# Patient Record
Sex: Female | Born: 1950
Health system: Southern US, Community
[De-identification: ages and names within clinical notes are randomized; demographics above are authoritative.]

## PROBLEM LIST (undated history)

## (undated) DIAGNOSIS — E119 Type 2 diabetes mellitus without complications: Secondary | ICD-10-CM

## (undated) DIAGNOSIS — I10 Essential (primary) hypertension: Secondary | ICD-10-CM

## (undated) HISTORY — PX: ABDOMINAL HYSTERECTOMY: SHX81

---
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
## 1997-04-10 DIAGNOSIS — I10 Essential (primary) hypertension: Secondary | ICD-10-CM

## 1997-04-10 HISTORY — DX: Essential (primary) hypertension: I10

## 1997-10-08 ENCOUNTER — Ambulatory Visit (HOSPITAL_COMMUNITY): Admission: RE | Admit: 1997-10-08 | Discharge: 1997-10-08 | Payer: Self-pay | Admitting: Family Medicine

## 1997-11-03 ENCOUNTER — Encounter: Admission: RE | Admit: 1997-11-03 | Discharge: 1997-11-03 | Payer: Self-pay | Admitting: *Deleted

## 1998-02-10 ENCOUNTER — Ambulatory Visit (HOSPITAL_COMMUNITY): Admission: RE | Admit: 1998-02-10 | Discharge: 1998-02-10 | Payer: Self-pay | Admitting: Family Medicine

## 1998-02-10 ENCOUNTER — Encounter: Payer: Self-pay | Admitting: Family Medicine

## 1999-03-09 ENCOUNTER — Encounter: Payer: Self-pay | Admitting: Family Medicine

## 1999-03-09 ENCOUNTER — Ambulatory Visit (HOSPITAL_COMMUNITY): Admission: RE | Admit: 1999-03-09 | Discharge: 1999-03-09 | Payer: Self-pay | Admitting: Family Medicine

## 2000-02-15 ENCOUNTER — Ambulatory Visit (HOSPITAL_BASED_OUTPATIENT_CLINIC_OR_DEPARTMENT_OTHER): Admission: RE | Admit: 2000-02-15 | Discharge: 2000-02-15 | Payer: Self-pay | Admitting: Family Medicine

## 2000-04-01 ENCOUNTER — Ambulatory Visit (HOSPITAL_BASED_OUTPATIENT_CLINIC_OR_DEPARTMENT_OTHER): Admission: RE | Admit: 2000-04-01 | Discharge: 2000-04-01 | Payer: Self-pay | Admitting: Family Medicine

## 2002-07-22 ENCOUNTER — Other Ambulatory Visit: Admission: RE | Admit: 2002-07-22 | Discharge: 2002-07-22 | Payer: Self-pay | Admitting: Obstetrics & Gynecology

## 2002-09-12 ENCOUNTER — Ambulatory Visit (HOSPITAL_COMMUNITY): Admission: RE | Admit: 2002-09-12 | Discharge: 2002-09-12 | Payer: Self-pay | Admitting: Family Medicine

## 2003-04-11 DIAGNOSIS — E119 Type 2 diabetes mellitus without complications: Secondary | ICD-10-CM

## 2003-04-11 HISTORY — DX: Type 2 diabetes mellitus without complications: E11.9

## 2003-08-12 ENCOUNTER — Other Ambulatory Visit: Admission: RE | Admit: 2003-08-12 | Discharge: 2003-08-12 | Payer: Self-pay | Admitting: Obstetrics & Gynecology

## 2003-09-28 ENCOUNTER — Encounter (INDEPENDENT_AMBULATORY_CARE_PROVIDER_SITE_OTHER): Payer: Self-pay | Admitting: *Deleted

## 2003-09-28 ENCOUNTER — Ambulatory Visit (HOSPITAL_COMMUNITY): Admission: RE | Admit: 2003-09-28 | Discharge: 2003-09-28 | Payer: Self-pay | Admitting: Gastroenterology

## 2003-12-26 ENCOUNTER — Inpatient Hospital Stay (HOSPITAL_COMMUNITY): Admission: EM | Admit: 2003-12-26 | Discharge: 2003-12-30 | Payer: Self-pay | Admitting: Family Medicine

## 2004-09-14 ENCOUNTER — Other Ambulatory Visit: Admission: RE | Admit: 2004-09-14 | Discharge: 2004-09-14 | Payer: Self-pay | Admitting: Obstetrics & Gynecology

## 2004-10-19 ENCOUNTER — Encounter: Admission: RE | Admit: 2004-10-19 | Discharge: 2004-10-19 | Payer: Self-pay | Admitting: Family Medicine

## 2006-11-21 ENCOUNTER — Emergency Department (HOSPITAL_COMMUNITY): Admission: EM | Admit: 2006-11-21 | Discharge: 2006-11-21 | Payer: Self-pay | Admitting: Emergency Medicine

## 2006-11-24 ENCOUNTER — Encounter: Admission: RE | Admit: 2006-11-24 | Discharge: 2006-11-24 | Payer: Self-pay | Admitting: Family Medicine

## 2006-11-27 ENCOUNTER — Encounter: Admission: RE | Admit: 2006-11-27 | Discharge: 2007-02-15 | Payer: Self-pay | Admitting: Family Medicine

## 2008-06-14 ENCOUNTER — Emergency Department (HOSPITAL_COMMUNITY): Admission: EM | Admit: 2008-06-14 | Discharge: 2008-06-14 | Payer: Self-pay | Admitting: Family Medicine

## 2010-08-26 NOTE — Op Note (Signed)
NAME:  Allison Matthews, Allison Matthews                     ACCOUNT NO.:  [id number]   MEDICAL RECORD NO.:  [id number]                   PATIENT TYPE:  AMB   LOCATION:  ENDO                                 FACILITY:  [facility]   PHYSICIAN:  Anselmo Rod, M.D.               DATE OF BIRTH:  07/08/1950   DATE OF PROCEDURE:  09/28/2003  DATE OF DISCHARGE:                                 OPERATIVE REPORT   PROCEDURE PERFORMED:  Colonoscopy with snare polypectomy times one.   ENDOSCOPIST:  Charna Elizabeth, M.D.   </INSTRUMENT USEDOlympus video colonoscope.   INDICATIONS FOR PROCEDURE:  The patient is a 60 year old African-American  female with a history of mild anemia, hemoglobin 10.8 g/dl and a family  history of colon cancer in her father.  Undergoing screening colonoscopy to  rule out colonic polyps, masses, etc.   PREPROCEDURE PREPARATION:  Informed consent was procured from the patient.  The patient was fasted for eight hours prior to the procedure and prepped  with a bottle of magnesium citrate and a gallon of GoLYTELY the night prior  to the procedure.   PREPROCEDURE PHYSICAL:  The patient had stable vital signs.  Neck supple.  Chest clear to auscultation.  S1 and S2 regular.  Abdomen soft with normal  bowel sounds.   DESCRIPTION OF PROCEDURE:  The patient was placed in left lateral decubitus  position and sedated with 50 mg of Demerol and 3 mg of Versed in slow  incremental doses.  This was done very cautiously as the patient has a  history of sleep apnea syndrome.  Once the patient was adequately sedated  and maintained on low flow oxygen and continuous cardiac monitoring, the  Olympus video colonoscope was advanced from the rectum to the cecum.  A flat  sessile polyp was snared from the cecum.  Small internal hemorrhoids were  seen on retroflexion.  The rest of the colon appeared healthy and without  lesions.  No masses or polyps were identified in the right colon, transverse  colon and left  colon.  The patient tolerated the procedure well without  complications.  There was some residual stool in the colon and multiple  washes were done.   IMPRESSION:  1. Flat sessile polyp snared from cecum.  Otherwise normal colonoscopy.  2. Small internal hemorrhoids.   RECOMMENDATIONS:  1. Continue high fiber diet with liberal fluid intake.  2. Avoid nonsteroidals including aspirin for the next four weeks.  3. Outpatient followup in the next two weeks for further recommendations.                                               Anselmo Rod, M.D.    JNM/MEDQ  D:  09/28/2003  T:  09/29/2003  Job:  [id number]   cc:  Renaye Rakers, M.D.  [phone] [address]  [place]  [place] [address]  Fax: [phone]

## 2010-08-26 NOTE — Discharge Summary (Signed)
NAMEARRAYA, BUCK           ACCOUNT NO.:  [id number]   MEDICAL RECORD NO.:  [id number]          PATIENT TYPE:  INP   LOCATION:  2021                         FACILITY:  [facility]   PHYSICIAN:  Michaelyn Barter, M.D. DATE OF BIRTH:  Aug 28, 1950   DATE OF ADMISSION:  12/25/2003  DATE OF DISCHARGE:  12/30/2003                                 DISCHARGE SUMMARY   DISCHARGE DIAGNOSES:  1.  Atypical chest pain.  2.  Hypertension.  3.  Diabetes mellitus.   PROCEDURE:  Cardiac catheterization.  No complications.   HISTORY OF PRESENT ILLNESS:  [name] is a 60 year old female who  presented on admission with a chief complaint of recurrent substernal chest  pain.  Her EKG changes were described as borderline at the time of  admission.  Her enzymes were negative.  Her chest pain was said to be  recurrent occurring over the previous 5 days.  The pain was described as  heavy, sharp with some radiation to the left shoulder and left arm  accompanied by diaphoresis and shortness of breath.  Chest pain lasted for  approximately a couple of hours.   HOSPITAL COURSE:  The patient was admitted to the telemetry floor to rule  out MI.  She had a chest x-ray completed, cardiac enzymes q.8h. x3 and EKG.  She was provided with aspirin, oxygen and IV nitroglycerin.  Heparin 5000  units subcu was also provided.  For her pain, she received morphine sulfate  3 mg p.r.n. q.3h.  Cardiology was consulted also.  On the following day,  cardiology performed cardiac catheterization on the patient.  The final  conclusion of the cardiac catheterization revealed preserved overall left  ventricular function with no evidence of critical obstruction in the  coronary tree.  Cardiology recommended that the patient be seen by  gastroenterology.  Therefore, gastroenterology was consulted.  Dr. Elnoria Howard,  from gastroenterology, saw the patient the next day.  After his evaluation,  he informed me that the patient was okay  from a gastroenterology point of  view to be discharged home and that the patient should follow up with him as  an outpatient.  The patient states she has his office number and that she  will give him a call over the course of the next week to schedule a followup  evaluation.   CONDITION ON DISCHARGE:  Improved.  Currently, she denies having any chest  pain.   DISCHARGE MEDICATIONS:  1.  Protonix 40 mg one tablet p.o. q.d.  2.  Senokot two tablets q.d.  3.  Irbesartan 75 mg one tablet p.o. q.d.  4.  Sertraline 100 mg one tablet q.d.  5.  Zocor 40 mg one tablet q.d.   FOLLOW UP:  She is instructed to follow up with her primary care physician,  Dr. Parke Simmers, on January 08, 2004.       OR/MEDQ  D:  12/30/2003  T:  12/30/2003  Job:  [id number]   cc:   Renaye Rakers, M.D.  [address], [address]  [place]  [place] [address]  Fax: [phone]

## 2010-08-26 NOTE — Consult Note (Signed)
Allison Matthews, BADLEY NO.:  [id number]   MEDICAL RECORD NO.:  [id number]                   PATIENT TYPE:  INP   LOCATION:  2021                                 FACILITY:  [facility]   PHYSICIAN:  Doylene Canning. Ladona Ridgel, M.D.               DATE OF BIRTH:  [birth year]   DATE OF CONSULTATION:  12/26/2003  DATE OF DISCHARGE:                                   CONSULTATION   REQUESTING PHYSICIAN:  Dr. Brien Few   INDICATION FOR CONSULTATION:  Evaluation of chest pain and shortness of  breath.   HISTORY OF PRESENT ILLNESS:  The patient is a very pleasant 60 year old  woman with a history of diabetes and hypertension.  Over the last six months  she has had rare episodes of chest pain but over the last week these have  increased in frequency and severity.  Over the last day or two her chest  discomfort is described as a heaviness with occasional sharp sensations  radiating to the left shoulder and arm.  It was associated with diaphoresis  and shortness of breath and nausea yesterday and for that reason she came to  the emergency department for additional evaluation.  The patient was treated  with nitrates and her symptoms are improved.  She denies cough or  hemoptysis.  She denies fevers or chills.   PAST MEDICAL HISTORY:  1.  Diabetes mellitus diagnosed in 2001.  2.  Hypertension diagnosed in 2001.  3.  The patient states that she has previously been on Avandamet but this      was discontinued because of hypoglycemia, now mainly diet controlled.  4.  Dyslipidemia.  5.  Depression.  6.  She has sleep apnea, but is poorly compliant with her CPAP.  7.  She gives a history of hysterectomy in 1998.  8.  Colonoscopy in June of 2005 with the finding of polyps present.   FAMILY HISTORY:  Father died of colon cancer.  She has a mother with  diabetes and a stroke and heart failure.   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  She denies  tobacco or ethanol abuse or recreational drug use.   REVIEW OF SYSTEMS:  Negative for vision or hearing problems.  She denies  cough, night sweats, chest pain, shortness of breath, polyuria, polydipsia,  heat or cold intolerance, skin changes, or arthritic complaints except as  previously noted.  She denies any problems with weakness or numbness.  She  does have occasional sensations of depression.   PHYSICAL EXAMINATION:  GENERAL:  She is a pleasant, well-appearing middle  aged woman in no distress.  VITAL SIGNS:  Blood pressure 100/58, pulse 62 and regular, respirations 16,  temperature 97.  HEENT:  Normocephalic, atraumatic.  Pupils equal and round.  The oropharynx  was moist.  The sclerae were anicteric.  NECK:  No jugular venous distention.  There  was no thyromegaly.  The trachea  was midline.  LUNGS:  Clear bilaterally to auscultation.  There were no wheezes, rales, or  rhonchi.  CARDIOVASCULAR:  Regular rate and rhythm with normal S1 and S2.  ABDOMEN:  Soft, nontender, nondistended.  There was no organomegaly.  EXTREMITIES:  No clubbing, cyanosis, edema.  Pulses were 2+ and symmetric.  NEUROLOGIC:  Alert and oriented x3 with cranial nerves II-XII grossly  intact.  Strength was 5/5 and symmetric.   The EKG demonstrates sinus rhythm with a nonspecific T-wave abnormality.   IMPRESSION:  1.  Chest pain with both typical as well as atypical features for coronary      ischemia.  2.  Diabetes mellitus diet controlled.  3.  Hypertension.  4.  Abnormal EKG in association with multiple cardiac risk factors.   DISCUSSION:  I have discussed the treatment options with the patient.  I  discussed the indications for proceeding with left heart catheterization.  This will be scheduled to be carried out on Monday.      GWT/MEDQ  D:  12/26/2003  T:  12/28/2003  Job:  [id number]   cc:   Renaye Rakers, M.D.  [phone] [address], [address]  [place]  [place] [address]  Fax: [phone]

## 2010-08-26 NOTE — H&P (Signed)
[name], Allison Matthews                       ACCOUNT NO.:  [id number]   MEDICAL RECORD NO.:  [id number]                   PATIENT TYPE:  INP   LOCATION:  2021                                 FACILITY:  [facility]   PHYSICIAN:  Allison Matthews, M.D.               DATE OF BIRTH:  1950/11/12   DATE OF ADMISSION:  12/25/2003  DATE OF DISCHARGE:                                HISTORY & PHYSICAL   Primary medical doctor:  Allison Matthews, M.D.   CHIEF COMPLAINT:  Chest pain on and off for five days.   HISTORY OF PRESENT ILLNESS:  As above.  The patient is a 60 year old African-  American female.  According to the patient, for the past five days she has  had retrosternal chest discomfort, mainly heaviness, occasionally sharp,  radiating to the left shoulder and the left arm.  This is associated with  diaphoresis and occasionally shortness of breath.  Duration of chest pain  usually approximately a few hours.  The patient initially thought that this  was indigestion.  Today, however, i.e., December 25, 2003, she had chest  discomfort all day and then when at work at about 3:30 p.m., symptoms got  much worse.  She called her PMD, Dr. Renaye Matthews, who referred her to the  emergency department.  On arrival at the emergency department she was  treated with aspirin and nitrates with amelioration of chest pain.   PAST MEDICAL HISTORY:  1.  Type 2 diabetes mellitus, 2001.  2.  Hypertension, 2001.  3.  Dyslipidemia.  4.  Sleep apnea syndrome, on CPAP.  5.  Depression June 2005.  6.  Status post hysterectomy, 1998.  7.  Anemia, status post colonoscopy September 28, 2003.  This showed polyps.   MEDICATION HISTORY:  1.  Zoloft 100 mg p.o. daily.  2.  Avandamet, query dose, one p.o. daily.  3.  Atacand, query dose.   ALLERGIES/SENSITIVITIES:  No known drug allergies.   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  Nonsmoker, nondrinker, has no history of drug abuse.   FAMILY HISTORY:  Mother has diabetes mellitus, is status post CVA, has a  history of CHF.  Father is deceased from colon cancer.  He was diabetic.  The patient has two offspring, who are both alive and well.   REVIEW OF SYSTEMS:  As in HPI and chief complaint, otherwise negative.   PHYSICAL EXAMINATION:  VITAL SIGNS:  Temperature 97.4, pulse 94 per minute  and regular, respiratory rate 18, blood pressure 125/71 mmHg.  Oxygen  saturation on room air 98%.  CBG 125 mg/dl.  GENERAL:  The patient is comfortable, not short of breath at rest,  communicative, cooperative.  HEENT:  No clinical pallor, jaundice, or conjunctival injection.  NECK:  Supple, JVP not seen, no palpable lymphadenopathy, no palpable  goiter, no carotid bruits.  CHEST:  Clinically clear to auscultation, no wheezes, no crackles.  CARDIAC:  Heart sounds one and two heard, normal and regular, no murmurs.  ABDOMEN:  Full, soft, and nontender.  There is no palpable organomegaly, no  palpable masses, normal bowel sounds.  LOWER EXTREMITIES:  No pitting edema.  Palpable peripheral pulses.  MUSCULOSKELETAL:  Full range of motion of all joints.  No joint swelling,  deformity, or tenderness.  CENTRAL NERVOUS SYSTEM:  Alert and oriented x3.  No focal neurologic  deficits on gross examination.   INVESTIGATIONS:  Hemoglobin 12.3, hematocrit 36.0.  Electrolytes:  Sodium  136, potassium 3.9, chloride 104, bicarbonate 28.4, BUN 16, creatinine 1.1,  glucose 96.  CK-MB less than 1.0.  Troponin I less than 0.05.  EKG:  Sinus  rhythm, regular, 73 per minute, normal axis, no acute ischemic changes.   ASSESSMENT AND PLAN:  1.  Chest pain.  The patient is to be admitted to the telemetry floor.  She      certainly has risk factors for coronary artery disease.  The patient      will therefore complete rule out MI protocol, continue on aspirin,      nitrates, oxygen, do chest x-ray, lipids, and thyroid profile.      Cardiology consultation has  been arranged.  This will be provided by Dr.      Andee Matthews with [facility].  2.  Diabetes mellitus, appears controlled.  Will place the patient on 1800      calorie ADA diet and manage with sliding scale insulin.  3.  Hypertension, controlled.  Will monitor.  Continue ARB.  4.  Depression.  Clinically stable.  Continue Zoloft.   Further management will depend on clinical course.  It is likely the patient  may have cardiac catheterization.  This will, however, depend upon  cardiology recommendations.       CO/MEDQ  D:  12/26/2003  T:  12/26/2003  Job:  [id number]   cc:   Allison Matthews, M.D.  [address]  [place]  [place] [address]  Fax: [phone]

## 2010-08-26 NOTE — Cardiovascular Report (Signed)
NAMEJACELYN, CUEN NO.:  [id number]   MEDICAL RECORD NO.:  [id number]                   PATIENT TYPE:  INP   LOCATION:  2021                                 FACILITY:  [facility]   PHYSICIAN:  Arturo Morton. Riley Kill, M.D. [facility]         DATE OF BIRTH:  07/24/1950   DATE OF PROCEDURE:  12/28/2003  DATE OF DISCHARGE:                              CARDIAC CATHETERIZATION   INDICATIONS:  Ms. Malachi is a very nice 60 year old female who presents  with some recurrent substernal chest pain.  She has had borderline changes  electrocardiographically.  Enzymes have been negative.  Because of the  recurrent episodes of chest pain, she was treated with Lovenox and  subsequently scheduled for cardiac catheterization.   PROCEDURES:  1.  Left heart catheterization.  2.  Selective coronary arteriography.  3.  Selective left ventriculography.   DESCRIPTION OF PROCEDURE:  The patient was brought to the catheterization  lab and prepped and draped in the usual fashion.  I discussed the  catheterization procedure with her and Dr. Ladona Ridgel had documented doing the  same prior to the procedure being done.  Through an anterior puncture, the  right femoral artery was easily entered.  A 6 French sheath was placed.  Central, aortic and left ventricular pressures were measured with pigtail.  Ventriculography was performed in the RAO projection.  Following this,  coronary arteriography was performed using standard Judkins catheters.  The  JL-4 diagnostic catheter was slightly large and pointed in the downward  direction.  However, there appeared to be adequate opacification for the  coronary arteries.  I would chose a 3.5 diagnostic on the next  catheterization procedure.  She tolerated the procedure well and there were  no complications.  I then brought her husband into the viewing room and we  reviewed the films together.  She was taken to the holding area in  satisfactory clinical  condition.   HEMODYNAMIC DATA:  1.  Central aorta:  103/64, mean 82.  2.  Left ventricle:  105/8.  3.  No gradient on pullback across the aortic valve.   ANGIOGRAPHIC DATA:  1.  Ventriculography was performed in the RAO projection.  Overall systolic      function appeared to be well preserved.  No definite segmental      abnormalities or contraction were identified.  There was a very tiny      section of the mid inferior which on a post PVC beat raised the issue of      possible mild wall motion abnormality, but in careful review I was not      convinced of this.  2.  The left main coronary artery had minimal luminal irregularity      proximally.  In multiple views, there did not appear to be significant      focal narrowing.  3.  The left anterior descending artery courses to the apex.  It appeared to  be relatively smooth throughout with minimal minor luminal irregularity      diffusely in the proximal vessel.  There was a major diagonal branch and      there did not appear to be high grade focal narrowing.  4.  The circumflex is a large caliber vessel providing trifurcating and a      large marginal branch, smaller AV circumflex. There is mild luminal      irregularity proximally.  5.  The right coronary artery is a somewhat tortuous vessel without      significant focal disease.   CONCLUSION:  1.  Preserved overall left ventricular function.  2.  No evidence of critical obstruction in coronary tree.   The patient has had some recurrent symptoms which are pretty substantial.  I  would probably continue to do in-hospital workup until a better sense of the  what the etiology is.      TDS/MEDQ  D:  12/28/2003  T:  12/28/2003  Job:  [id number]   cc:   Renaye Rakers, M.D.  [phone] [address]  [place]  [place] [address]  Fax: [phone]   Doylene Canning. Ladona Ridgel, M.D.

## 2010-08-30 ENCOUNTER — Other Ambulatory Visit: Payer: Self-pay | Admitting: Family Medicine

## 2010-08-30 ENCOUNTER — Ambulatory Visit
Admission: RE | Admit: 2010-08-30 | Discharge: 2010-08-30 | Disposition: A | Discharge status: Home / Self Care | Payer: BC Managed Care – PPO | Source: Ambulatory Visit | Attending: Family Medicine | Admitting: Family Medicine

## 2010-08-30 DIAGNOSIS — R52 Pain, unspecified: Secondary | ICD-10-CM

## 2012-12-06 ENCOUNTER — Ambulatory Visit (INDEPENDENT_AMBULATORY_CARE_PROVIDER_SITE_OTHER): Payer: BC Managed Care – PPO | Admitting: Endocrinology

## 2012-12-06 ENCOUNTER — Encounter: Payer: Self-pay | Admitting: Endocrinology

## 2012-12-06 VITALS — BP 110/72 | HR 77 | Temp 97.8°F | Resp 12 | Ht 64.0 in | Wt 169.0 lb

## 2012-12-06 DIAGNOSIS — IMO0001 Reserved for inherently not codable concepts without codable children: Secondary | ICD-10-CM

## 2012-12-06 DIAGNOSIS — I10 Essential (primary) hypertension: Secondary | ICD-10-CM

## 2012-12-06 DIAGNOSIS — E7800 Pure hypercholesterolemia, unspecified: Secondary | ICD-10-CM

## 2012-12-06 DIAGNOSIS — N289 Disorder of kidney and ureter, unspecified: Secondary | ICD-10-CM

## 2012-12-06 DIAGNOSIS — E119 Type 2 diabetes mellitus without complications: Secondary | ICD-10-CM | POA: Insufficient documentation

## 2012-12-06 NOTE — Progress Notes (Signed)
Patient ID: Allison Matthews, female   DOB: 10/11/1950, 62 y.o.   MRN: [id number]  Rickia Freeburg is an 62 y.o. female.   Reason for Appointment: Diabetes follow-up   History of Present Illness   Diagnosis: Type 2 DIABETES MELITUS, date of diagnosis:  2005    She was initially treated with insulin and subsequently was transitioned to Kombiglyze XR with excellent control However on her last visit her A1c appear to be higher and she had been less compliant with diet and exercise Although she has not gained any weight she still has relatively higher readings after meals and A1c is somewhat higher again She was supposed to get a prescription for the higher dose of Kombiglyze XR but did not get this     Oral hypoglycemic drugs: Kombiglyze XR        Side effects from medications: None Monitors blood glucose: Once a day.    Glucometer:  FreeStyle         Blood Glucose readings from recall: readings before breakfast: 103 PC lunch or supper 170-180   Hypoglycemia frequency: Never.           Meals: 3 meals per day.          Physical activity: exercise:  none recently           Dietician visit: Most recent: 2009           Complications: are: None    The last HbgA1c was reported as 6.5 in 5/14    Her last microalbumin level was normal in 1/14  Wt Readings from Last 3 Encounters:  12/06/12 169 lb (76.658 kg)      Medication List       This list is accurate as of: 12/06/12 11:59 PM.  Always use your most recent med list.               KOMBIGLYZE XR 08-998 MG Tb24  Generic drug:  Saxagliptin-Metformin  5-1,000 mg.     levothyroxine 50 MCG tablet  Commonly known as:  SYNTHROID, LEVOTHROID  50 mcg.     valsartan-hydrochlorothiazide 80-12.5 MG per tablet  Commonly known as:  DIOVAN HCT  Take 1 tablet by mouth daily.        Allergies: Allergies no known allergies  No past medical history on file.  Past Surgical History  Procedure Laterality Date  . Abdominal hysterectomy       Family History  Problem Relation Age of Onset  . Diabetes Mother     Social History:  reports that she has never smoked. She has never used smokeless tobacco. Her alcohol and drug histories are not on file.  Review of Systems:  She has had diabetic retinopathy treated with laser and follows with Dr. Luciana Axe regularly  HYPERTENSION:  she has had fairly good control of her blood pressure with Diovan HCT  HYPERLIPIDEMIA: The lipid abnormality consists of elevated LDL, LDL was 103 on her last visit but she had been out of her medication refill.     Examination:   BP 110/72  Pulse 77  Temp(Src) 97.8 F (36.6 C)  Resp 12  Ht 5\' 4"  (1.626 m)  Wt 169 lb (76.658 kg)  BMI 28.99 kg/m2  SpO2 96%  Body mass index is 28.99 kg/(m^2).   ASSESSMENT/ PLAN::   Diabetes type 2   The patient's diabetes control appears to be relatively worse with higher postprandial readings This is partly related to her lack of exercise May also be  having some progression of her diabetes since A1c is increasing. She will increase her Kombiglyze XR to 2 tablets daily  She will call if blood sugars are not improved  Finnian Husted 12/10/2012, 4:27 PM   Addendum: Creatinine 1.5, this is new, will reduce her Diovan HCTZ to 80/12.5  and have her followup with PCP for repeat evaluation and blood pressure Also will not increase her metformin ER until creatinine is back to normal

## 2012-12-06 NOTE — Patient Instructions (Addendum)
Please check blood sugars at least half the time about 2 hours after any meal and as directed on waking up. Please bring blood sugar monitor to each visit  Walk 3-4 days a week  Kombiglyze 2.5/100, 2 daily

## 2012-12-10 ENCOUNTER — Encounter: Payer: Self-pay | Admitting: Endocrinology

## 2012-12-10 DIAGNOSIS — I10 Essential (primary) hypertension: Secondary | ICD-10-CM | POA: Insufficient documentation

## 2012-12-10 LAB — LDL CHOLESTEROL, DIRECT: Direct LDL: 139.5 mg/dL

## 2012-12-10 LAB — COMPREHENSIVE METABOLIC PANEL
ALT: 19 U/L (ref 0–35)
AST: 24 U/L (ref 0–37)
Albumin: 4.3 g/dL (ref 3.5–5.2)
Alkaline Phosphatase: 85 U/L (ref 39–117)
BUN: 22 mg/dL (ref 6–23)
CO2: 26 mEq/L (ref 19–32)
Calcium: 10.1 mg/dL (ref 8.4–10.5)
Chloride: 98 mEq/L (ref 96–112)
Creatinine, Ser: 1.5 mg/dL — ABNORMAL HIGH (ref 0.4–1.2)
GFR: 46.58 mL/min — ABNORMAL LOW (ref >60.00)
Glucose, Bld: 94 mg/dL (ref 70–99)
Potassium: 4.2 mEq/L (ref 3.5–5.1)
Sodium: 134 mEq/L — ABNORMAL LOW (ref 135–145)
Total Bilirubin: 0.5 mg/dL (ref 0.3–1.2)
Total Protein: 7.9 g/dL (ref 6.0–8.3)

## 2012-12-10 LAB — LIPID PANEL
Cholesterol: 213 mg/dL — ABNORMAL HIGH (ref 0–200)
HDL: 55.8 mg/dL (ref >39.00)
Total CHOL/HDL Ratio: 4
Triglycerides: 137 mg/dL (ref 0.0–149.0)
VLDL: 27.4 mg/dL (ref 0.0–40.0)

## 2012-12-10 LAB — HEMOGLOBIN A1C: Hgb A1c MFr Bld: 6.7 % — ABNORMAL HIGH (ref 4.6–6.5)

## 2012-12-10 MED ORDER — VALSARTAN-HYDROCHLOROTHIAZIDE 80-12.5 MG PO TABS: 1.0000 | ORAL_TABLET | Freq: Every day | ORAL | Status: DC

## 2012-12-12 ENCOUNTER — Telehealth: Payer: Self-pay | Admitting: *Deleted

## 2012-12-12 NOTE — Telephone Encounter (Signed)
lmovm

## 2012-12-12 NOTE — Telephone Encounter (Signed)
Message copied by Hermenia Bers on Thu Dec 12, 2012  9:21 AM ------      Message from: Reather Littler      Created: Tue Dec 10, 2012  4:12 PM       To inform patient: her kidney test is a little worse, possibly blood pressure too low. Need to change her Diovan HCTZ to 80/12.5 instead of 160/12.5      Will need to have this rechecked with PCP in 3-4 weeks      Also cholesterol very high, needs to take her simvastatin daily ------

## 2013-03-04 ENCOUNTER — Other Ambulatory Visit (INDEPENDENT_AMBULATORY_CARE_PROVIDER_SITE_OTHER): Payer: BC Managed Care – PPO

## 2013-03-04 DIAGNOSIS — IMO0001 Reserved for inherently not codable concepts without codable children: Secondary | ICD-10-CM

## 2013-03-04 DIAGNOSIS — E7800 Pure hypercholesterolemia, unspecified: Secondary | ICD-10-CM

## 2013-03-04 LAB — MICROALBUMIN / CREATININE URINE RATIO
Creatinine,U: 221.5 mg/dL
Microalb Creat Ratio: 0.7 mg/g (ref 0.0–30.0)
Microalb, Ur: 1.5 mg/dL (ref 0.0–1.9)

## 2013-03-04 LAB — COMPREHENSIVE METABOLIC PANEL WITH GFR
ALT: 23 U/L (ref 0–35)
AST: 28 U/L (ref 0–37)
Albumin: 4 g/dL (ref 3.5–5.2)
Alkaline Phosphatase: 79 U/L (ref 39–117)
BUN: 17 mg/dL (ref 6–23)
CO2: 28 meq/L (ref 19–32)
Calcium: 9.6 mg/dL (ref 8.4–10.5)
Chloride: 104 meq/L (ref 96–112)
Creatinine, Ser: 1.2 mg/dL (ref 0.4–1.2)
GFR: 57.81 mL/min — ABNORMAL LOW
Glucose, Bld: 88 mg/dL (ref 70–99)
Potassium: 4.2 meq/L (ref 3.5–5.1)
Sodium: 139 meq/L (ref 135–145)
Total Bilirubin: 0.5 mg/dL (ref 0.3–1.2)
Total Protein: 7.7 g/dL (ref 6.0–8.3)

## 2013-03-04 LAB — LIPID PANEL
Cholesterol: 207 mg/dL — ABNORMAL HIGH (ref 0–200)
HDL: 61 mg/dL (ref >39.00)
Total CHOL/HDL Ratio: 3
Triglycerides: 61 mg/dL (ref 0.0–149.0)
VLDL: 12.2 mg/dL (ref 0.0–40.0)

## 2013-03-04 LAB — HEMOGLOBIN A1C: Hgb A1c MFr Bld: 6.7 % — ABNORMAL HIGH (ref 4.6–6.5)

## 2013-03-04 LAB — LDL CHOLESTEROL, DIRECT: Direct LDL: 134.6 mg/dL

## 2013-03-07 ENCOUNTER — Other Ambulatory Visit: Payer: BC Managed Care – PPO

## 2013-03-14 ENCOUNTER — Ambulatory Visit (INDEPENDENT_AMBULATORY_CARE_PROVIDER_SITE_OTHER): Payer: BC Managed Care – PPO | Admitting: Endocrinology

## 2013-03-14 ENCOUNTER — Encounter: Payer: Self-pay | Admitting: Endocrinology

## 2013-03-14 VITALS — BP 124/72 | HR 80 | Temp 98.3°F | Resp 12 | Ht 64.5 in | Wt 167.4 lb

## 2013-03-14 DIAGNOSIS — IMO0001 Reserved for inherently not codable concepts without codable children: Secondary | ICD-10-CM

## 2013-03-14 DIAGNOSIS — I10 Essential (primary) hypertension: Secondary | ICD-10-CM

## 2013-03-14 DIAGNOSIS — E785 Hyperlipidemia, unspecified: Secondary | ICD-10-CM

## 2013-03-14 MED ORDER — SIMVASTATIN 40 MG PO TABS: 40.0000 mg | ORAL_TABLET | Freq: Every day | ORAL | Status: DC

## 2013-03-14 MED ORDER — SAXAGLIPTIN-METFORMIN ER 2.5-1000 MG PO TB24: 2.5000 mg | ORAL_TABLET | Freq: Every day | ORAL | Status: DC

## 2013-03-14 NOTE — Patient Instructions (Addendum)
Walk 15 min daily  Restart Simvistatin  Kombiglyze 2 daily

## 2013-03-14 NOTE — Progress Notes (Signed)
Patient ID: Allison Matthews, female   DOB: [birth year], 62 y.o.   MRN: [id number]  Allison Matthews is an 62 y.o. female.   Reason for Appointment: Diabetes follow-up   History of Present Illness   Diagnosis: Type 2 DIABETES MELITUS, date of diagnosis:  2005    She was initially treated with insulin and subsequently was transitioned to Kombiglyze XR with excellent control Previously had upper normal A1c results and had been fairly compliant with diet and exercise  However since early 2014 her A1c readings appear to be higher and she had been less compliant with diet and exercise In 5/14 her A1c was 6.5  Recent history: Although she has not gained any weight the A1c is somewhat higher again She is apparently still taking 1 tablet of Kombiglyze XR instead of the maximum dose that was recommended on the last visit She has had relatively good readings at home with occasional high readings at night after eating sweets like ice cream and occasionally high in the morning also  Oral hypoglycemic drugs: Kombiglyze XR        Side effects from medications: None Monitors blood glucose: Once a day.    Glucometer:  FreeStyle         Blood Glucose readings Fasting 97-148 and nonfasting 108-226 with overall average 128 Hypoglycemia frequency: Never.           Meals: 3 meals per day. Trying to watch her diet as directed better but not consistently       Physical activity: exercise:  none recently           Dietician visit: Most recent: 2009           Complications: are: None      Wt Readings from Last 3 Encounters:  03/14/13 167 lb 6.4 oz (75.932 kg)  12/06/12 169 lb (76.658 kg)   Labs:  Lab Results  Component Value Date   HGBA1C 6.7* 03/04/2013   HGBA1C 6.7* 12/06/2012   Lab Results  Component Value Date   MICROALBUR 1.5 03/04/2013   CREATININE 1.2 03/04/2013      Medication List       This list is accurate as of: 03/14/13  4:39 PM.  Always use your most recent med list.                KOMBIGLYZE XR 08-998 MG Tb24  Generic drug:  Saxagliptin-Metformin  5-1,000 mg.     levothyroxine 50 MCG tablet  Commonly known as:  SYNTHROID, LEVOTHROID  50 mcg.     valsartan-hydrochlorothiazide 80-12.5 MG per tablet  Commonly known as:  DIOVAN HCT  Take 1 tablet by mouth daily.        Allergies: No Known Allergies  No past medical history on file.  Past Surgical History  Procedure Laterality Date  . Abdominal hysterectomy      Family History  Problem Relation Age of Onset  . Diabetes Mother     Social History:  reports that she has never smoked. She has never used smokeless tobacco. Her alcohol and drug histories are not on file.  Review of Systems:  She has had diabetic retinopathy treated with laser and follows with Dr. Luciana Axe regularly  HYPERTENSION:  she has had fairly good control of her blood pressure with Diovan HCT, the dose was reduced on the last visit because of mild increase in creatinine. Creatinine is back to normal now  HYPERLIPIDEMIA: The lipid abnormality consists of elevated LDL.She is concerned about possible  side effects of statin drugs that she read about and has not taken simvastatin     Examination:   BP 124/72  Pulse 80  Temp(Src) 98.3 F (36.8 C)  Resp 12  Ht 5' 4.5" (1.638 m)  Wt 167 lb 6.4 oz (75.932 kg)  BMI 28.30 kg/m2  SpO2 97%  Body mass index is 28.3 kg/(m^2).   ASSESSMENT/ PLAN::   Diabetes type 2   The patient's diabetes control appears to be still inadequate This is partly related to her lack of exercise and also some inconsistent diet May also be having some progression of her diabetes  She will increase her Kombiglyze XR to 2 tablets daily, new prescription sent  To check blood sugars more consistently also especially after supper She will call if blood sugars are not improved  Hyperlipidemia:  She is concerned about possible side effects of statin drugs and given her information on this. Discussed  need for reduction of cardiovascular events with statin drugs especially with her hypercholesterolemia and she will start her simvastatin now  Blood pressure controlled with low-dose Diovan HCT  Diogo Anne 03/14/2013, 4:39 PM

## 2013-06-09 ENCOUNTER — Other Ambulatory Visit (INDEPENDENT_AMBULATORY_CARE_PROVIDER_SITE_OTHER): Payer: BC Managed Care – PPO

## 2013-06-09 DIAGNOSIS — E785 Hyperlipidemia, unspecified: Secondary | ICD-10-CM

## 2013-06-09 DIAGNOSIS — E1165 Type 2 diabetes mellitus with hyperglycemia: Principal | ICD-10-CM

## 2013-06-09 DIAGNOSIS — IMO0001 Reserved for inherently not codable concepts without codable children: Secondary | ICD-10-CM

## 2013-06-09 LAB — COMPREHENSIVE METABOLIC PANEL
ALT: 28 U/L (ref 0–35)
AST: 29 U/L (ref 0–37)
Albumin: 4.4 g/dL (ref 3.5–5.2)
Alkaline Phosphatase: 82 U/L (ref 39–117)
BUN: 18 mg/dL (ref 6–23)
CO2: 27 mEq/L (ref 19–32)
Calcium: 10.1 mg/dL (ref 8.4–10.5)
Chloride: 101 mEq/L (ref 96–112)
Creatinine, Ser: 1.2 mg/dL (ref 0.4–1.2)
GFR: 57.76 mL/min — ABNORMAL LOW (ref >60.00)
Glucose, Bld: 101 mg/dL — ABNORMAL HIGH (ref 70–99)
Potassium: 4 mEq/L (ref 3.5–5.1)
Sodium: 139 mEq/L (ref 135–145)
Total Bilirubin: 0.6 mg/dL (ref 0.3–1.2)
Total Protein: 8.4 g/dL — ABNORMAL HIGH (ref 6.0–8.3)

## 2013-06-09 LAB — LDL CHOLESTEROL, DIRECT: Direct LDL: 71.1 mg/dL

## 2013-06-09 LAB — HEMOGLOBIN A1C: Hgb A1c MFr Bld: 6.9 % — ABNORMAL HIGH (ref 4.6–6.5)

## 2013-06-13 ENCOUNTER — Other Ambulatory Visit: Payer: Self-pay | Admitting: *Deleted

## 2013-06-13 ENCOUNTER — Encounter: Payer: Self-pay | Admitting: Endocrinology

## 2013-06-13 ENCOUNTER — Ambulatory Visit (INDEPENDENT_AMBULATORY_CARE_PROVIDER_SITE_OTHER): Payer: BC Managed Care – PPO | Admitting: Endocrinology

## 2013-06-13 VITALS — BP 122/72 | HR 79 | Temp 98.1°F | Resp 16 | Ht 64.5 in | Wt 169.2 lb

## 2013-06-13 DIAGNOSIS — I10 Essential (primary) hypertension: Secondary | ICD-10-CM

## 2013-06-13 DIAGNOSIS — E1139 Type 2 diabetes mellitus with other diabetic ophthalmic complication: Secondary | ICD-10-CM

## 2013-06-13 DIAGNOSIS — E1165 Type 2 diabetes mellitus with hyperglycemia: Principal | ICD-10-CM

## 2013-06-13 DIAGNOSIS — E039 Hypothyroidism, unspecified: Secondary | ICD-10-CM

## 2013-06-13 DIAGNOSIS — IMO0001 Reserved for inherently not codable concepts without codable children: Secondary | ICD-10-CM

## 2013-06-13 DIAGNOSIS — E11319 Type 2 diabetes mellitus with unspecified diabetic retinopathy without macular edema: Secondary | ICD-10-CM

## 2013-06-13 DIAGNOSIS — E785 Hyperlipidemia, unspecified: Secondary | ICD-10-CM

## 2013-06-13 MED ORDER — GLUCOSE BLOOD VI STRP: ORAL_STRIP | Status: DC

## 2013-06-13 NOTE — Progress Notes (Signed)
Patient ID: Allison Matthews, female   DOB: 19-Apr-1950, 63 y.o.   MRN: [id number]   Reason for Appointment: Diabetes follow-up   History of Present Illness   Diagnosis: Type 2 DIABETES MELITUS, date of diagnosis:  2005    She was initially treated with insulin and subsequently was transitioned to Kombiglyze XR with excellent control Previously had upper normal A1c results and had been fairly compliant with diet and exercise  Recent history: since early 2014 her A1c readings appear to be higher and she had been less compliant with diet and exercise Because of her higher A1c the Kombiglyze been increased to 2 tablets on her last visit in 12 14 However her A1c appears to be still relatively higher even though her home readings are fairly good She has not been compliant with exercise and weight has increased slightly She has had mostly good readings at home although not checking after dinner usually  Oral hypoglycemic drugs: Kombiglyze XR        Side effects from medications: None Monitors blood glucose: Once a day.    Glucometer:  FreeStyle         Blood Glucose readings Fasting  91-09  and nonfasting: 84-32  with overall average 107  Hypoglycemia frequency: Never.           Meals: 3 meals per day. Trying to watch her diet as directed but not consistently       Physical activity: exercise:  none recently           Dietician visit: Most recent: 2009           Complications: are: None      Wt Readings from Last 3 Encounters:  06/13/13 169 lb 3.2 oz (76.749 kg)  03/14/13 167 lb 6.4 oz (75.932 kg)  12/06/12 169 lb (76.658 kg)   Labs:  Lab Results  Component Value Date   HGBA1C 6.9* 06/09/2013   HGBA1C 6.7* 03/04/2013   HGBA1C 6.7* 12/06/2012   Lab Results  Component Value Date   MICROALBUR 1.5 03/04/2013   CREATININE 1.2 06/09/2013      Medication List       This list is accurate as of: 06/13/13  4:24 PM.  Always use your most recent med list.               HYDROcodone-acetaminophen 7.5-325 MG per tablet  Commonly known as:  NORCO     levothyroxine 50 MCG tablet  Commonly known as:  SYNTHROID, LEVOTHROID  50 mcg.     Saxagliptin-Metformin 2.08-998 MG Tb24  Commonly known as:  KOMBIGLYZE XR  Take 2.5 mg by mouth daily. 2 tabs daily     simvastatin 40 MG tablet  Commonly known as:  ZOCOR  Take 1 tablet (40 mg total) by mouth at bedtime.     valsartan-hydrochlorothiazide 80-12.5 MG per tablet  Commonly known as:  DIOVAN HCT  Take 1 tablet by mouth daily.        Allergies: No Known Allergies  No past medical history on file.  Past Surgical History  Procedure Laterality Date  . Abdominal hysterectomy      Family History  Problem Relation Age of Onset  . Diabetes Mother     Social History:  reports that she has never smoked. She has never used smokeless tobacco. Her alcohol and drug histories are not on file.  Review of Systems:  She has had diabetic retinopathy treated with laser and follows with Dr. Luciana Axe regularly  HYPERTENSION:  she has had fairly good control of her blood pressure with Diovan HCT; her dose was reduced in 2014   HYPERLIPIDEMIA: The lipid abnormality consists of elevated LDL.She is compliant with her simvastatin after discussion the last visit and  is taking  Medication in the morning  for better compliance  Lab Results  Component Value Date   CHOL 207* 03/04/2013   HDL 61.00 03/04/2013   LDLDIRECT 71.1 06/09/2013   TRIG 61.0 03/04/2013   CHOLHDL 3 03/04/2013       Examination:   BP 122/72  Pulse 79  Temp(Src) 98.1 F (36.7 C)  Resp 16  Ht 5' 4.5" (1.638 m)  Wt 169 lb 3.2 oz (76.749 kg)  BMI 28.61 kg/m2  SpO2 98%  Body mass index is 28.61 kg/(m^2).   ASSESSMENT/ PLAN:   Diabetes type 2   The patient's diabetes control appears to be still inadequate with relatively high A1c but her home readings are excellent She may be having some high postprandial readings which she is not doing as  much   Also not losing weight because of  lack of exercise and also some inconsistent diet  She will continue same regimen for now  To check blood sugars more consistently also especially after supper She will call if blood sugars are not improved  Hyperlipidemia:  Well controlled with simvastatin   Blood pressure controlled with low-dose Diovan HCT and creatinine is stable at 1.2   Rhyatt Muska 06/13/2013, 4:24 PM   Appointment on 06/09/2013  Component Date Value Ref Range Status  . Hemoglobin A1C 06/09/2013 6.9* 4.6 - 6.5 % Final   Glycemic Control Guidelines for People with Diabetes:Non Diabetic:  <6%Goal of Therapy: <7%Additional Action Suggested:  >8%   . Sodium 06/09/2013 139  135 - 145 mEq/L Final  . Potassium 06/09/2013 4.0  3.5 - 5.1 mEq/L Final  . Chloride 06/09/2013 101  96 - 112 mEq/L Final  . CO2 06/09/2013 27  19 - 32 mEq/L Final  . Glucose, Bld 06/09/2013 101* 70 - 99 mg/dL Final  . BUN 16/10/960403/05/2013 18  6 - 23 mg/dL Final  . Creatinine, Ser 06/09/2013 1.2  0.4 - 1.2 mg/dL Final  . Total Bilirubin 06/09/2013 0.6  0.3 - 1.2 mg/dL Final  . Alkaline Phosphatase 06/09/2013 82  39 - 117 U/L Final  . AST 06/09/2013 29  0 - 37 U/L Final  . ALT 06/09/2013 28  0 - 35 U/L Final  . Total Protein 06/09/2013 8.4* 6.0 - 8.3 g/dL Final  . Albumin 54/09/811903/05/2013 4.4  3.5 - 5.2 g/dL Final  . Calcium 14/78/295603/05/2013 10.1  8.4 - 10.5 mg/dL Final  . GFR 21/30/865703/05/2013 57.76* >60.00 mL/min Final  . Direct LDL 06/09/2013 71.1   Final   Optimal:  <100 mg/dLNear or Above Optimal:  100-129 mg/dLBorderline High:  130-159 mg/dLHigh:  160-189 mg/dLVery High:  >190 mg/dL

## 2013-06-13 NOTE — Patient Instructions (Signed)
Please check blood sugars at least half the time about 2 hours after any meal and as directed on waking up. Please bring blood sugar monitor to each visit  Start exercise program 3 days a week   Can take all Meds at same time

## 2013-06-15 DIAGNOSIS — E039 Hypothyroidism, unspecified: Secondary | ICD-10-CM | POA: Insufficient documentation

## 2013-07-16 ENCOUNTER — Other Ambulatory Visit: Payer: Self-pay | Admitting: *Deleted

## 2013-07-16 MED ORDER — GLUCOSE BLOOD VI STRP: ORAL_STRIP | Status: DC

## 2013-07-16 MED ORDER — ONETOUCH DELICA LANCETS FINE MISC: Status: DC

## 2013-07-16 MED ORDER — ONETOUCH VERIO W/DEVICE KIT
1.0000 | PACK | Freq: Two times a day (BID) | Status: DC
Start: 2013-07-16 — End: 2015-02-24

## 2013-08-17 ENCOUNTER — Other Ambulatory Visit: Payer: Self-pay | Admitting: Endocrinology

## 2013-08-22 ENCOUNTER — Other Ambulatory Visit: Payer: Self-pay | Admitting: Endocrinology

## 2013-10-09 ENCOUNTER — Other Ambulatory Visit (INDEPENDENT_AMBULATORY_CARE_PROVIDER_SITE_OTHER): Payer: BC Managed Care – PPO

## 2013-10-09 DIAGNOSIS — E1165 Type 2 diabetes mellitus with hyperglycemia: Principal | ICD-10-CM

## 2013-10-09 DIAGNOSIS — E039 Hypothyroidism, unspecified: Secondary | ICD-10-CM

## 2013-10-09 DIAGNOSIS — IMO0001 Reserved for inherently not codable concepts without codable children: Secondary | ICD-10-CM

## 2013-10-09 LAB — COMPREHENSIVE METABOLIC PANEL
ALT: 24 U/L (ref 0–35)
AST: 25 U/L (ref 0–37)
Albumin: 4.2 g/dL (ref 3.5–5.2)
Alkaline Phosphatase: 102 U/L (ref 39–117)
BUN: 18 mg/dL (ref 6–23)
CO2: 25 mEq/L (ref 19–32)
Calcium: 9.6 mg/dL (ref 8.4–10.5)
Chloride: 104 mEq/L (ref 96–112)
Creatinine, Ser: 1.1 mg/dL (ref 0.4–1.2)
GFR: 63.08 mL/min (ref >60.00)
Glucose, Bld: 108 mg/dL — ABNORMAL HIGH (ref 70–99)
Potassium: 4.2 mEq/L (ref 3.5–5.1)
Sodium: 139 mEq/L (ref 135–145)
Total Bilirubin: 0.6 mg/dL (ref 0.2–1.2)
Total Protein: 8.1 g/dL (ref 6.0–8.3)

## 2013-10-09 LAB — T4, FREE: Free T4: 0.89 ng/dL (ref 0.60–1.60)

## 2013-10-09 LAB — HEMOGLOBIN A1C: Hgb A1c MFr Bld: 6.9 % — ABNORMAL HIGH (ref 4.6–6.5)

## 2013-10-09 LAB — MICROALBUMIN / CREATININE URINE RATIO
Creatinine,U: 124.2 mg/dL
Microalb Creat Ratio: 0.3 mg/g (ref 0.0–30.0)
Microalb, Ur: 0.4 mg/dL (ref 0.0–1.9)

## 2013-10-09 LAB — TSH: TSH: 0.44 u[IU]/mL (ref 0.35–4.50)

## 2013-10-13 ENCOUNTER — Encounter: Payer: Self-pay | Admitting: Endocrinology

## 2013-10-13 ENCOUNTER — Ambulatory Visit (INDEPENDENT_AMBULATORY_CARE_PROVIDER_SITE_OTHER): Payer: BC Managed Care – PPO | Admitting: Endocrinology

## 2013-10-13 VITALS — BP 124/78 | HR 86 | Temp 97.9°F | Resp 16 | Ht 64.5 in | Wt 169.8 lb

## 2013-10-13 DIAGNOSIS — E785 Hyperlipidemia, unspecified: Secondary | ICD-10-CM

## 2013-10-13 DIAGNOSIS — IMO0001 Reserved for inherently not codable concepts without codable children: Secondary | ICD-10-CM

## 2013-10-13 DIAGNOSIS — E039 Hypothyroidism, unspecified: Secondary | ICD-10-CM

## 2013-10-13 DIAGNOSIS — E1165 Type 2 diabetes mellitus with hyperglycemia: Principal | ICD-10-CM

## 2013-10-13 NOTE — Progress Notes (Signed)
Patient ID: Allison Matthews, female   DOB: November 01, 1950, 63 y.o.   MRN: [id number]   Reason for Appointment: Diabetes follow-up   History of Present Illness   Diagnosis: Type 2 DIABETES MELITUS, date of diagnosis:  2005    She was initially treated with insulin and subsequently was transitioned to [place] XR with excellent control Previously had upper normal A1c results and had been fairly compliant with diet and exercise  Recent history: since early 2014 her A1c readings appear to be higher and she had been less compliant with diet and exercise Because of her higher A1c the Kombiglyze been increased to 2 tablets  in 03/2013 However her A1c appears to be still relatively higher even though her home readings are fairly good She has better compliant with exercise and trying to walk However has not been able to lose weight She has had mostly good readings at home although not checking after dinner usually; did not bring her monitor today  Oral hypoglycemic drugs: Kombiglyze XR        Side effects from medications: None  Monitors blood glucose: Once a day.    Glucometer:  FreeStyle         Blood Glucose readings from recall: Fasting   115-120 today 131, p.c. lunch 137 and pcs 159 Hypoglycemia frequency: Never.           Meals: 3 meals per day. Trying to watch her diet most of the time      Physical activity: exercise:  Walking 15 min            Dietician visit: Most recent: 2595           Complications: are: None      Wt Readings from Last 3 Encounters:  10/13/13 169 lb 12.8 oz (77.021 kg)  06/13/13 169 lb 3.2 oz (76.749 kg)  03/14/13 167 lb 6.4 oz (75.932 kg)   Labs:  Lab Results  Component Value Date   HGBA1C 6.9* 10/09/2013   HGBA1C 6.9* 06/09/2013   HGBA1C 6.7* 03/04/2013   Lab Results  Component Value Date   MICROALBUR 0.4 10/09/2013   CREATININE 1.1 10/09/2013      Medication List       This list is accurate as of: 10/13/13  4:00 PM.  Always use your most recent med  list.               glucose blood test strip  Commonly known as:  ONETOUCH VERIO  Use as instructed to check blood sugar 2 times per day dx code 250.02     HYDROcodone-acetaminophen 7.5-325 MG per tablet  Commonly known as:  NORCO     KOMBIGLYZE XR 2.08-998 MG Tb24  Generic drug:  Saxagliptin-Metformin  TAKE 2 TABLETS DAILY     levothyroxine 50 MCG tablet  Commonly known as:  SYNTHROID, LEVOTHROID  50 mcg.     ONETOUCH DELICA LANCETS FINE Misc  Use to obtain a blood specimen 2 times per day dx code 250.02     [facility] VERIO W/DEVICE Kit  1 each by In Vitro route 2 (two) times daily. Use to check blood sugars 2 times per day dx code 250.02     simvastatin 40 MG tablet  Commonly known as:  ZOCOR  TAKE 1 TABLET AT BEDTIME     valsartan-hydrochlorothiazide 80-12.5 MG per tablet  Commonly known as:  DIOVAN HCT  Take 1 tablet by mouth daily.        Allergies: No Known  Allergies  No past medical history on file.  Past Surgical History  Procedure Laterality Date  . Abdominal hysterectomy      Family History  Problem Relation Age of Onset  . Diabetes Mother     Social History:  reports that she has never smoked. She has never used smokeless tobacco. Her alcohol and drug histories are not on file.  Review of Systems:  She has had diabetic retinopathy treated with laser and follows with Dr. Zadie Rhine regularly  HYPERTENSION:  she has had fairly good control of her blood pressure with Diovan HCT; her dose was reduced in 2014   HYPERLIPIDEMIA: The lipid abnormality consists of elevated LDL.She is compliant with her simvastatin after discussion the last visit and  is taking  Medication in the morning  for better compliance  Lab Results  Component Value Date   CHOL 207* 03/04/2013   HDL 61.00 03/04/2013   LDLDIRECT 71.1 06/09/2013   TRIG 61.0 03/04/2013   CHOLHDL 3 03/04/2013   No symptoms of neuropathy  Eye exam 11/14  Gout in knee in 3/15     Examination:    BP 124/78  Pulse 86  Temp(Src) 97.9 F (36.6 C)  Resp 16  Ht 5' 4.5" (1.638 m)  Wt 169 lb 12.8 oz (77.021 kg)  BMI 28.71 kg/m2  SpO2 96%  Body mass index is 28.71 kg/(m^2).   ASSESSMENT/ PLAN:   Diabetes type 2   The patient's diabetes control appears to be fair with relatively high A1c but her home readings appear to be quite good Not sure how often she is monitoring her blood sugars, has not brought her monitor She can do better with more frequent exercise which she has been doing previously She will continue same medication regimen for now  To check blood sugars more consistently also especially after supper She will call if blood sugars are higher  Hyperlipidemia:  Well controlled with simvastatin is compliant now  Blood pressure controlled with low-dose Diovan HCT and creatinine is stable   Mild hypothyroidism: TSH normal but relatively lower 0.44, currently only on 50 mcg   Ronnetta Currington 10/13/2013, 4:00 PM   Appointment on 10/09/2013  Component Date Value Ref Range Status  . Hemoglobin A1C 10/09/2013 6.9* 4.6 - 6.5 % Final   Glycemic Control Guidelines for People with Diabetes:Non Diabetic:  <6%Goal of Therapy: <7%Additional Action Suggested:  >8%   . Sodium 10/09/2013 139  135 - 145 mEq/L Final  . Potassium 10/09/2013 4.2  3.5 - 5.1 mEq/L Final  . Chloride 10/09/2013 104  96 - 112 mEq/L Final  . CO2 10/09/2013 25  19 - 32 mEq/L Final  . Glucose, Bld 10/09/2013 108* 70 - 99 mg/dL Final  . BUN 10/09/2013 18  6 - 23 mg/dL Final  . Creatinine, Ser 10/09/2013 1.1  0.4 - 1.2 mg/dL Final  . Total Bilirubin 10/09/2013 0.6  0.2 - 1.2 mg/dL Final  . Alkaline Phosphatase 10/09/2013 102  39 - 117 U/L Final  . AST 10/09/2013 25  0 - 37 U/L Final  . ALT 10/09/2013 24  0 - 35 U/L Final  . Total Protein 10/09/2013 8.1  6.0 - 8.3 g/dL Final  . Albumin 10/09/2013 4.2  3.5 - 5.2 g/dL Final  . Calcium 10/09/2013 9.6  8.4 - 10.5 mg/dL Final  . GFR 10/09/2013 63.08  >60.00 mL/min  Final  . Microalb, Ur 10/09/2013 0.4  0.0 - 1.9 mg/dL Final  . Creatinine,U 10/09/2013 124.2   Final  .  Microalb Creat Ratio 10/09/2013 0.3  0.0 - 30.0 mg/g Final  . TSH 10/09/2013 0.44  0.35 - 4.50 uIU/mL Final  . Free T4 10/09/2013 0.89  0.60 - 1.60 ng/dL Final

## 2014-01-24 ENCOUNTER — Other Ambulatory Visit: Payer: Self-pay | Admitting: Endocrinology

## 2014-02-06 ENCOUNTER — Other Ambulatory Visit (INDEPENDENT_AMBULATORY_CARE_PROVIDER_SITE_OTHER): Payer: BC Managed Care – PPO

## 2014-02-06 DIAGNOSIS — E039 Hypothyroidism, unspecified: Secondary | ICD-10-CM

## 2014-02-06 DIAGNOSIS — E785 Hyperlipidemia, unspecified: Secondary | ICD-10-CM

## 2014-02-06 DIAGNOSIS — E1165 Type 2 diabetes mellitus with hyperglycemia: Secondary | ICD-10-CM

## 2014-02-06 DIAGNOSIS — IMO0002 Reserved for concepts with insufficient information to code with codable children: Secondary | ICD-10-CM

## 2014-02-06 LAB — BASIC METABOLIC PANEL
BUN: 18 mg/dL (ref 6–23)
CO2: 27 mEq/L (ref 19–32)
Calcium: 9.9 mg/dL (ref 8.4–10.5)
Chloride: 102 mEq/L (ref 96–112)
Creatinine, Ser: 1.2 mg/dL (ref 0.4–1.2)
GFR: 58.2 mL/min — ABNORMAL LOW (ref >60.00)
Glucose, Bld: 103 mg/dL — ABNORMAL HIGH (ref 70–99)
Potassium: 4.3 mEq/L (ref 3.5–5.1)
Sodium: 137 mEq/L (ref 135–145)

## 2014-02-06 LAB — TSH: TSH: 1.6 u[IU]/mL (ref 0.35–4.50)

## 2014-02-06 LAB — LDL CHOLESTEROL, DIRECT: Direct LDL: 131.2 mg/dL

## 2014-02-06 LAB — HEMOGLOBIN A1C: Hgb A1c MFr Bld: 6.8 % — ABNORMAL HIGH (ref 4.6–6.5)

## 2014-02-13 ENCOUNTER — Encounter: Payer: Self-pay | Admitting: Endocrinology

## 2014-02-13 ENCOUNTER — Ambulatory Visit (INDEPENDENT_AMBULATORY_CARE_PROVIDER_SITE_OTHER): Payer: BC Managed Care – PPO | Admitting: Endocrinology

## 2014-02-13 VITALS — BP 118/78 | HR 83 | Temp 98.2°F | Resp 14 | Ht 64.5 in | Wt 167.0 lb

## 2014-02-13 DIAGNOSIS — E119 Type 2 diabetes mellitus without complications: Secondary | ICD-10-CM | POA: Insufficient documentation

## 2014-02-13 DIAGNOSIS — E038 Other specified hypothyroidism: Secondary | ICD-10-CM

## 2014-02-13 DIAGNOSIS — I10 Essential (primary) hypertension: Secondary | ICD-10-CM

## 2014-02-13 DIAGNOSIS — E7800 Pure hypercholesterolemia, unspecified: Secondary | ICD-10-CM

## 2014-02-13 DIAGNOSIS — E063 Autoimmune thyroiditis: Secondary | ICD-10-CM

## 2014-02-13 NOTE — Progress Notes (Signed)
Patient ID: Allison Matthews, female   DOB: 02/27/1951, 63 y.o.   MRN: [id number]   Reason for Appointment: Diabetes follow-up   History of Present Illness   Diagnosis: Type 2 DIABETES MELITUS, date of diagnosis:  2005    She was initially treated with insulin and subsequently was transitioned to Kombiglyze XR with excellent control Previously had upper normal A1c results and had been fairly compliant with diet and exercise  Recent history: since early 2014 her A1c levels have been relatively higher Because of her higher A1c the Kombiglyze been increased to 2 tablets  in 03/2013 She has fairly consistently compliant with exercise and trying to walk However has not been able to lose weight She has had mostly good readings at home although not clear how often she is monitoring;again did not bring her monitor today She thinks she is checking her sugar after meals periodically also A1c still relatively higher than previously but below 7%; higher than expected for home readings  Oral hypoglycemic drugs: Kombiglyze XR        Side effects from medications: None  Monitors blood glucose:  ?  Frequency.    Glucometer:  FreeStyle         Blood Glucose readings from recall: Fasting 110-118;   p.c. lunch highest 187; pc otherwise <145 Hypoglycemia frequency: Never.           Meals: 3 meals per day. Trying to watch her diet most of the time      Physical activity: exercise:  Walking 30 min; 4/7 days a week           Dietician visit: Most recent: 2009            Complications: are: None      Wt Readings from Last 3 Encounters:  02/13/14 167 lb (75.751 kg)  10/13/13 169 lb 12.8 oz (77.021 kg)  06/13/13 169 lb 3.2 oz (76.749 kg)   Diabetes Labs:  Lab Results  Component Value Date   HGBA1C 6.8* 02/06/2014   HGBA1C 6.9* 10/09/2013   HGBA1C 6.9* 06/09/2013   Lab Results  Component Value Date   MICROALBUR 0.4 10/09/2013   CREATININE 1.2 02/06/2014       Medication List       This  list is accurate as of: 02/13/14  8:09 AM.  Always use your most recent med list.               fluticasone 0.05 % cream  Commonly known as:  CUTIVATE     glucose blood test strip  Commonly known as:  ONETOUCH VERIO  Use as instructed to check blood sugar 2 times per day dx code 250.02     HYDROcodone-acetaminophen 7.5-325 MG per tablet  Commonly known as:  NORCO     KOMBIGLYZE XR 2.08-998 MG Tb24  Generic drug:  Saxagliptin-Metformin  TAKE 2 TABLETS DAILY     levothyroxine 50 MCG tablet  Commonly known as:  SYNTHROID, LEVOTHROID  50 mcg.     ONETOUCH DELICA LANCETS FINE Misc  Use to obtain a blood specimen 2 times per day dx code 250.02     ONETOUCH VERIO W/DEVICE Kit  1 each by In Vitro route 2 (two) times daily. Use to check blood sugars 2 times per day dx code 250.02     simvastatin 40 MG tablet  Commonly known as:  ZOCOR  TAKE 1 TABLET AT BEDTIME     valsartan-hydrochlorothiazide 160-25 MG per tablet  Commonly known as:    DIOVAN-HCT        Allergies: No Known Allergies  No past medical history on file.  Past Surgical History  Procedure Laterality Date  . Abdominal hysterectomy      Family History  Problem Relation Age of Onset  . Diabetes Mother     Social History:  reports that she has never smoked. She has never used smokeless tobacco. Her alcohol and drug histories are not on file.  Review of Systems:  She has had diabetic retinopathy treated with laser and follows with Dr. Rankin regularly  HYPERTENSION:  she has had fairly good control of her blood pressure with Diovan HCT; her dose was reduced in 2014   History of mild renal insufficiency: Creatinine is stable No history of microhematuria recently  HYPERLIPIDEMIA: The lipid abnormality consists of elevated LDL.She is Again is not compliant with her simvastatin despite discussion on the last visit  She is now concerned about effects after reading information on the Internet and she thought she  was feeling tired from the medication but since stopping the medication she is feeling the same  Lab Results  Component Value Date   CHOL 207* 03/04/2013   HDL 61.00 03/04/2013   LDLDIRECT 131.2 02/06/2014   TRIG 61.0 03/04/2013   CHOLHDL 3 03/04/2013   No symptoms of neuropathy  Eye exam 11/14  Mild hypothyroidism: Adequately controlled  Lab Results  Component Value Date   FREET4 0.89 10/09/2013   TSH 1.60 02/06/2014   TSH 0.44 10/09/2013        Examination:   BP 118/78 mmHg  Pulse 83  Temp(Src) 98.2 F (36.8 C)  Resp 14  Ht 5' 4.5" (1.638 m)  Wt 167 lb (75.751 kg)  BMI 28.23 kg/m2  SpO2 97%  Body mass index is 28.23 kg/(m^2).   Diabetic foot exam shows normal monofilament sensation in the toes and plantar surfaces, no skin lesions or ulcers on the feet and normal pedal pulses  No edema  ASSESSMENT/ PLAN:   Diabetes type 2   The patient's diabetes control appears to be good and her A1c is below 7% Home blood sugars are looking fairly good although not clear how often she is monitoring She thinks postprandial readings are usually not high Discussed monitoring after meals more consistently as these are possibly higher at times and this may help her with compliance with meal planning  Lab fasting glucose is near normal She has lost 2 pounds and is generally trying to be compliant with her diet and exercise regimen  For now we'll continue her Kombiglyze unchanged, She has no difficulty with side effects or insurance coverage  Hyperlipidemia:  Needs better compliance with simvastatin, reassured her that simvastatin does not cause fatigue and does have significant long-term benefits and cardiovascular risk reduction She agrees to restart simvastatin, even if she does so only half a tablet a day  Blood pressure controlled with low-dose Diovan HCT and creatinine is stable   Mild hypothyroidism: TSH normal, currently only on 50 mcg and she will continue the same  dose  Exam shows no signs of neuropathy, discussed foot care in general  Counseling time over 50% of today's 25 minute visit   KUMAR,AJAY 02/13/2014, 8:09 AM   No visits with results within 1 Week(s) from this visit. Latest known visit with results is:  Appointment on 02/06/2014  Component Date Value Ref Range Status  . Hgb A1c MFr Bld 02/06/2014 6.8* 4.6 - 6.5 % Final   Glycemic Control Guidelines   for People with Diabetes:Non Diabetic:  <6%Goal of Therapy: <7%Additional Action Suggested:  >8%   . Sodium 02/06/2014 137  135 - 145 mEq/L Final  . Potassium 02/06/2014 4.3  3.5 - 5.1 mEq/L Final  . Chloride 02/06/2014 102  96 - 112 mEq/L Final  . CO2 02/06/2014 27  19 - 32 mEq/L Final  . Glucose, Bld 02/06/2014 103* 70 - 99 mg/dL Final  . BUN 02/06/2014 18  6 - 23 mg/dL Final  . Creatinine, Ser 02/06/2014 1.2  0.4 - 1.2 mg/dL Final  . Calcium 02/06/2014 9.9  8.4 - 10.5 mg/dL Final  . GFR 02/06/2014 58.20* >60.00 mL/min Final  . Direct LDL 02/06/2014 131.2   Final   Optimal:  <100 mg/dLNear or Above Optimal:  100-129 mg/dLBorderline High:  130-159 mg/dLHigh:  160-189 mg/dLVery High:  >190 mg/dL  . TSH 02/06/2014 1.60  0.35 - 4.50 uIU/mL Final      

## 2014-02-13 NOTE — Patient Instructions (Signed)
Restart Simvastatin  Please check blood sugars at least half the time about 2 hours after any meal and 2 times per week on waking up. Please bring blood sugar monitor to each visit

## 2014-04-11 ENCOUNTER — Other Ambulatory Visit: Payer: Self-pay | Admitting: Endocrinology

## 2014-06-10 ENCOUNTER — Other Ambulatory Visit (INDEPENDENT_AMBULATORY_CARE_PROVIDER_SITE_OTHER): Payer: BLUE CROSS/BLUE SHIELD

## 2014-06-10 DIAGNOSIS — E119 Type 2 diabetes mellitus without complications: Secondary | ICD-10-CM

## 2014-06-10 LAB — COMPREHENSIVE METABOLIC PANEL
ALT: 26 U/L (ref 0–35)
AST: 25 U/L (ref 0–37)
Albumin: 4.5 g/dL (ref 3.5–5.2)
Alkaline Phosphatase: 75 U/L (ref 39–117)
BUN: 17 mg/dL (ref 6–23)
CO2: 26 mEq/L (ref 19–32)
Calcium: 9.8 mg/dL (ref 8.4–10.5)
Chloride: 103 mEq/L (ref 96–112)
Creatinine, Ser: 1.11 mg/dL (ref 0.40–1.20)
GFR: 63.61 mL/min (ref >60.00)
Glucose, Bld: 96 mg/dL (ref 70–99)
Potassium: 3.9 mEq/L (ref 3.5–5.1)
Sodium: 137 mEq/L (ref 135–145)
Total Bilirubin: 0.4 mg/dL (ref 0.2–1.2)
Total Protein: 7.9 g/dL (ref 6.0–8.3)

## 2014-06-10 LAB — LIPID PANEL
Cholesterol: 204 mg/dL — ABNORMAL HIGH (ref 0–200)
HDL: 58.8 mg/dL (ref >39.00)
LDL Cholesterol: 123 mg/dL — ABNORMAL HIGH (ref 0–99)
NonHDL: 145.2
Total CHOL/HDL Ratio: 3
Triglycerides: 110 mg/dL (ref 0.0–149.0)
VLDL: 22 mg/dL (ref 0.0–40.0)

## 2014-06-10 LAB — HEMOGLOBIN A1C: Hgb A1c MFr Bld: 6.9 % — ABNORMAL HIGH (ref 4.6–6.5)

## 2014-06-15 ENCOUNTER — Encounter: Payer: Self-pay | Admitting: Endocrinology

## 2014-06-15 ENCOUNTER — Ambulatory Visit (INDEPENDENT_AMBULATORY_CARE_PROVIDER_SITE_OTHER): Payer: BLUE CROSS/BLUE SHIELD | Admitting: Endocrinology

## 2014-06-15 VITALS — BP 124/70 | HR 88 | Temp 97.9°F | Resp 14 | Ht 64.5 in | Wt 168.2 lb

## 2014-06-15 DIAGNOSIS — E119 Type 2 diabetes mellitus without complications: Secondary | ICD-10-CM

## 2014-06-15 DIAGNOSIS — E7800 Pure hypercholesterolemia, unspecified: Secondary | ICD-10-CM

## 2014-06-15 DIAGNOSIS — I10 Essential (primary) hypertension: Secondary | ICD-10-CM

## 2014-06-15 NOTE — Progress Notes (Signed)
Patient ID: Allison Matthews, female   DOB: 18-Nov-1950, 64 y.o.   MRN: [id number]   Reason for Appointment: Diabetes follow-up   History of Present Illness   Diagnosis: Type 2 DIABETES MELITUS, date of diagnosis:  2005    She was initially treated with insulin and subsequently was transitioned to [place] with excellent control Previously had upper normal A1c results and had been fairly compliant with diet and exercise Since early 2014 her A1c levels had been relatively higher Because of her higher A1c the Kombiglyze been increased to 2 tablets  in 03/2013  Recent history:  She has not been consistently compliant with exercise especially this winter. A1c is still slightly higher than normal although stable Blood sugars are lower than expected from her A1c but she has occasional readings around 180  Again has not been able to lose weight She generally trying to watch her diet.  Oral hypoglycemic drugs: Kombiglyze XR        Side effects from medications: None  Monitors blood glucose:  ?  Frequency.    Glucometer:  FreeStyle         Blood Glucose readings from   PRE-MEAL Breakfast 2 PM 5-6 PM Bedtime Overall  Glucose range: 89-129 79, 100 109-180    Mean/median: 109    121   POST-MEAL PC Breakfast PC Lunch PC Dinner  Glucose range: 124-178  90  Mean/median:      Hypoglycemia frequency: Never.           Meals: 3 meals per day. Trying to watch her diet most of the time      Physical activity: exercise:  None, was Walking 30 min; 4/7 days a week           Dietician visit: Most recent: 4431            Complications: are: None      Wt Readings from Last 3 Encounters:  06/15/14 168 lb 3.2 oz (76.295 kg)  02/13/14 167 lb (75.751 kg)  10/13/13 169 lb 12.8 oz (77.021 kg)   Diabetes Labs:  Lab Results  Component Value Date   HGBA1C 6.9* 06/10/2014   HGBA1C 6.8* 02/06/2014   HGBA1C 6.9* 10/09/2013   Lab Results  Component Value Date   MICROALBUR 0.4 10/09/2013   LDLCALC 123* 06/10/2014   CREATININE 1.11 06/10/2014       Medication List       This list is accurate as of: 06/15/14  8:44 AM.  Always use your most recent med list.               colchicine 0.6 MG tablet     fluticasone 0.05 % cream  Commonly known as:  CUTIVATE     glucose blood test strip  Commonly known as:  ONETOUCH VERIO  Use as instructed to check blood sugar 2 times per day dx code 250.02     HYDROcodone-acetaminophen 7.5-325 MG per tablet  Commonly known as:  NORCO     KOMBIGLYZE XR 2.08-998 MG Tb24  Generic drug:  Saxagliptin-Metformin  TAKE 2 TABLETS DAILY     levothyroxine 50 MCG tablet  Commonly known as:  SYNTHROID, LEVOTHROID  50 mcg.     ONETOUCH DELICA LANCETS FINE Misc  Use to obtain a blood specimen 2 times per day dx code 250.02     [facility] VERIO W/DEVICE Kit  1 each by In Vitro route 2 (two) times daily. Use to check blood sugars 2 times per day  dx code 250.02     simvastatin 40 MG tablet  Commonly known as:  ZOCOR  TAKE 1 TABLET AT BEDTIME     valsartan-hydrochlorothiazide 160-25 MG per tablet  Commonly known as:  DIOVAN-HCT        Allergies: No Known Allergies  No past medical history on file.  Past Surgical History  Procedure Laterality Date  . Abdominal hysterectomy      Family History  Problem Relation Age of Onset  . Diabetes Mother     Social History:  reports that she has never smoked. She has never used smokeless tobacco. Her alcohol and drug histories are not on file.  Review of Systems:  She has had diabetic retinopathy treated with laser and follows with Dr. Zadie Rhine regularly  HYPERTENSION:  she has had fairly good control of her blood pressure with Diovan HCT; her dose was reduced in 2014   History of mild renal insufficiency: Creatinine is stable  HYPERLIPIDEMIA: The lipid abnormality consists of elevated LDL. She is Again is not compliant with her simvastatin despite discussion on several visits. She is  trying to take the medication at bedtime but she forgets    Lab Results  Component Value Date   CHOL 204* 06/10/2014   HDL 58.80 06/10/2014   LDLCALC 123* 06/10/2014   LDLDIRECT 131.2 02/06/2014   TRIG 110.0 06/10/2014   CHOLHDL 3 06/10/2014   No symptoms of neuropathy  Mild hypothyroidism: Adequately controlled  Lab Results  Component Value Date   FREET4 0.89 10/09/2013   TSH 1.60 02/06/2014   TSH 0.44 10/09/2013   Diabetic foot exam in 11/15 showed normal monofilament sensation in the toes and plantar surfaces, no skin lesions or ulcers on the feet and normal pedal pulses      Examination:   BP 124/70 mmHg  Pulse 88  Temp(Src) 97.9 F (36.6 C)  Resp 14  Ht 5' 4.5" (1.638 m)  Wt 168 lb 3.2 oz (76.295 kg)  BMI 28.44 kg/m2  SpO2 97%  Body mass index is 28.44 kg/(m^2).    No edema  ASSESSMENT/ PLAN:   Diabetes type 2   The patient's diabetes control appears to be fair with occasional postprandial hyperglycemia although her A1c is below 7% Discussed monitoring after meals more consistently as these are likely higher at times and this may help her with compliance with meal planning Discussed need to start back on exercise especially to help her overall control and reduce weight.  Given information on indoor exercises to be done Lab fasting glucose is near normal  For now she will continue her Kombiglyze unchanged   Hyperlipidemia:  Needs better compliance with simvastatin, she will do better if she tries to take this in the morning regularly LDL is high and this was discussed  Blood pressure controlled with low-dose Diovan HCT and creatinine is stable    Chanteria Haggard 06/15/2014, 8:44 AM   Lab on 06/10/2014  Component Date Value Ref Range Status  . Hgb A1c MFr Bld 06/10/2014 6.9* 4.6 - 6.5 % Final   Glycemic Control Guidelines for People with Diabetes:Non Diabetic:  <6%Goal of Therapy: <7%Additional Action Suggested:  >8%   . Sodium 06/10/2014 137  135 - 145  mEq/L Final  . Potassium 06/10/2014 3.9  3.5 - 5.1 mEq/L Final  . Chloride 06/10/2014 103  96 - 112 mEq/L Final  . CO2 06/10/2014 26  19 - 32 mEq/L Final  . Glucose, Bld 06/10/2014 96  70 - 99 mg/dL Final  .  BUN 06/10/2014 17  6 - 23 mg/dL Final  . Creatinine, Ser 06/10/2014 1.11  0.40 - 1.20 mg/dL Final  . Total Bilirubin 06/10/2014 0.4  0.2 - 1.2 mg/dL Final  . Alkaline Phosphatase 06/10/2014 75  39 - 117 U/L Final  . AST 06/10/2014 25  0 - 37 U/L Final  . ALT 06/10/2014 26  0 - 35 U/L Final  . Total Protein 06/10/2014 7.9  6.0 - 8.3 g/dL Final  . Albumin 06/10/2014 4.5  3.5 - 5.2 g/dL Final  . Calcium 06/10/2014 9.8  8.4 - 10.5 mg/dL Final  . GFR 06/10/2014 63.61  >60.00 mL/min Final  . Cholesterol 06/10/2014 204* 0 - 200 mg/dL Final   ATP III Classification       Desirable:  < 200 mg/dL               Borderline High:  200 - 239 mg/dL          High:  > = 240 mg/dL  . Triglycerides 06/10/2014 110.0  0.0 - 149.0 mg/dL Final   Normal:  <150 mg/dLBorderline High:  150 - 199 mg/dL  . HDL 06/10/2014 58.80  >39.00 mg/dL Final  . VLDL 06/10/2014 22.0  0.0 - 40.0 mg/dL Final  . LDL Cholesterol 06/10/2014 123* 0 - 99 mg/dL Final  . Total CHOL/HDL Ratio 06/10/2014 3   Final                  Men          Women1/2 Average Risk     3.4          3.3Average Risk          5.0          4.42X Average Risk          9.6          7.13X Average Risk          15.0          11.0                      . NonHDL 06/10/2014 145.20   Final   NOTE:  Non-HDL goal should be 30 mg/dL higher than patient's LDL goal (i.e. LDL goal of < 70 mg/dL, would have non-HDL goal of < 100 mg/dL)

## 2014-06-15 NOTE — Patient Instructions (Signed)
Please check blood sugars at least half the time about 2 hours after any meal and 3 times per week on waking up. Please bring blood sugar monitor to each visit. Recommended blood sugar levels about 2 hours after meal is 140-180 and on waking up 90-130  Take Zocor in am daily

## 2014-07-20 ENCOUNTER — Other Ambulatory Visit: Payer: Self-pay | Admitting: Obstetrics & Gynecology

## 2014-07-21 LAB — CYTOLOGY - PAP

## 2014-09-24 LAB — HM DIABETES EYE EXAM

## 2014-10-13 ENCOUNTER — Other Ambulatory Visit (INDEPENDENT_AMBULATORY_CARE_PROVIDER_SITE_OTHER): Payer: BLUE CROSS/BLUE SHIELD

## 2014-10-13 DIAGNOSIS — E7800 Pure hypercholesterolemia, unspecified: Secondary | ICD-10-CM

## 2014-10-13 DIAGNOSIS — E119 Type 2 diabetes mellitus without complications: Secondary | ICD-10-CM

## 2014-10-13 LAB — HEMOGLOBIN A1C: Hgb A1c MFr Bld: 6.7 % — ABNORMAL HIGH (ref 4.6–6.5)

## 2014-10-13 LAB — COMPREHENSIVE METABOLIC PANEL
ALT: 22 U/L (ref 0–35)
AST: 30 U/L (ref 0–37)
Albumin: 4.4 g/dL (ref 3.5–5.2)
Alkaline Phosphatase: 85 U/L (ref 39–117)
BUN: 24 mg/dL — ABNORMAL HIGH (ref 6–23)
CO2: 27 mEq/L (ref 19–32)
Calcium: 10 mg/dL (ref 8.4–10.5)
Chloride: 101 mEq/L (ref 96–112)
Creatinine, Ser: 1.34 mg/dL — ABNORMAL HIGH (ref 0.40–1.20)
GFR: 51.13 mL/min — ABNORMAL LOW (ref >60.00)
Glucose, Bld: 103 mg/dL — ABNORMAL HIGH (ref 70–99)
Potassium: 3.9 mEq/L (ref 3.5–5.1)
Sodium: 138 mEq/L (ref 135–145)
Total Bilirubin: 0.6 mg/dL (ref 0.2–1.2)
Total Protein: 8.2 g/dL (ref 6.0–8.3)

## 2014-10-13 LAB — URINALYSIS, ROUTINE W REFLEX MICROSCOPIC
Bilirubin Urine: NEGATIVE
Hgb urine dipstick: NEGATIVE
Ketones, ur: NEGATIVE
Leukocytes, UA: NEGATIVE
Nitrite: NEGATIVE
RBC / HPF: NONE SEEN (ref 0–?)
Specific Gravity, Urine: 1.015 (ref 1.000–1.030)
Total Protein, Urine: NEGATIVE
Urine Glucose: NEGATIVE
Urobilinogen, UA: 0.2 (ref 0.0–1.0)
WBC, UA: NONE SEEN (ref 0–?)
pH: 6 (ref 5.0–8.0)

## 2014-10-13 LAB — MICROALBUMIN / CREATININE URINE RATIO
Creatinine,U: 144.2 mg/dL
Microalb Creat Ratio: 0.5 mg/g (ref 0.0–30.0)
Microalb, Ur: <0.7 mg/dL (ref 0.0–1.9)

## 2014-10-13 LAB — LIPID PANEL
Cholesterol: 144 mg/dL (ref 0–200)
HDL: 57.1 mg/dL (ref >39.00)
LDL Cholesterol: 74 mg/dL (ref 0–99)
NonHDL: 86.9
Total CHOL/HDL Ratio: 3
Triglycerides: 64 mg/dL (ref 0.0–149.0)
VLDL: 12.8 mg/dL (ref 0.0–40.0)

## 2014-10-16 ENCOUNTER — Ambulatory Visit (INDEPENDENT_AMBULATORY_CARE_PROVIDER_SITE_OTHER): Payer: BLUE CROSS/BLUE SHIELD | Admitting: Endocrinology

## 2014-10-16 ENCOUNTER — Encounter: Payer: Self-pay | Admitting: Endocrinology

## 2014-10-16 VITALS — BP 118/78 | HR 84 | Temp 98.3°F | Resp 14 | Ht 64.5 in | Wt 165.4 lb

## 2014-10-16 DIAGNOSIS — E038 Other specified hypothyroidism: Secondary | ICD-10-CM

## 2014-10-16 DIAGNOSIS — I10 Essential (primary) hypertension: Secondary | ICD-10-CM | POA: Diagnosis not present

## 2014-10-16 DIAGNOSIS — E7800 Pure hypercholesterolemia, unspecified: Secondary | ICD-10-CM

## 2014-10-16 DIAGNOSIS — E119 Type 2 diabetes mellitus without complications: Secondary | ICD-10-CM | POA: Diagnosis not present

## 2014-10-16 DIAGNOSIS — E063 Autoimmune thyroiditis: Secondary | ICD-10-CM

## 2014-10-16 NOTE — Progress Notes (Signed)
Patient ID: Allison Matthews, female   DOB: 1950/05/12, 64 y.o.   MRN: [id number]   Reason for Appointment: Diabetes follow-up   History of Present Illness   Diagnosis: Type 2 DIABETES MELITUS, date of diagnosis:  2005    She was initially treated with insulin and subsequently was transitioned to [place] XR with excellent control Previously had upper normal A1c results and had been fairly compliant with diet and exercise Since early 2014 her A1c levels had been relatively higher Because of her higher A1c the Kombiglyze been increased to 2 tablets  in 03/2013  Recent history:  Currently she is on Kombiglyze XR maximum dose alone as treatment Her A1c is consistently over 7% and slightly better this time also She has tried to do a little more exercise than before and is more motivated to do some walking with a coworker She thinks she is generally compliant with her diet As outlined above her blood sugars are fairly good but her monitoring is primarily in the mornings before breakfast  Oral hypoglycemic drugs: Kombiglyze XR        Side effects from medications: None  Monitors blood glucose:  every other day .    Glucometer:  Accu-Chek      Blood Glucose readings from meter download:  Mean values apply above for all meters except median for One Touch  PRE-MEAL Fasting Lunch  4 PM   7 PM  Overall  Glucose range:  100-138    109, 141   118    Mean/median:  120      132    Hypoglycemia frequency: Never.           Meals: 3 meals per day. Trying to watch her diet most of the time      Physical activity: exercise: some Walking             Dietician visit: Most recent: 7564            Complications: are: None      Wt Readings from Last 3 Encounters:  10/16/14 165 lb 6.4 oz (75.025 kg)  06/15/14 168 lb 3.2 oz (76.295 kg)  02/13/14 167 lb (75.751 kg)   Diabetes Labs:  Lab Results  Component Value Date   HGBA1C 6.7* 10/13/2014   HGBA1C 6.9* 06/10/2014   HGBA1C 6.8* 02/06/2014    Lab Results  Component Value Date   MICROALBUR <0.7 10/13/2014   LDLCALC 74 10/13/2014   CREATININE 1.34* 10/13/2014       Medication List       This list is accurate as of: 10/16/14  4:23 PM.  Always use your most recent med list.               colchicine 0.6 MG tablet     fluticasone 0.05 % cream  Commonly known as:  CUTIVATE     glucose blood test strip  Commonly known as:  ONETOUCH VERIO  Use as instructed to check blood sugar 2 times per day dx code 250.02     HYDROcodone-acetaminophen 7.5-325 MG per tablet  Commonly known as:  NORCO     KOMBIGLYZE XR 2.08-998 MG Tb24  Generic drug:  Saxagliptin-Metformin  TAKE 2 TABLETS DAILY     levothyroxine 50 MCG tablet  Commonly known as:  SYNTHROID, LEVOTHROID  50 mcg.     ONETOUCH DELICA LANCETS FINE Misc  Use to obtain a blood specimen 2 times per day dx code 250.02     ONETOUCH VERIO  W/DEVICE Kit  1 each by In Vitro route 2 (two) times daily. Use to check blood sugars 2 times per day dx code 250.02     simvastatin 40 MG tablet  Commonly known as:  ZOCOR  TAKE 1 TABLET AT BEDTIME     valsartan-hydrochlorothiazide 160-25 MG per tablet  Commonly known as:  DIOVAN-HCT        Allergies: No Known Allergies  No past medical history on file.  Past Surgical History  Procedure Laterality Date  . Abdominal hysterectomy      Family History  Problem Relation Age of Onset  . Diabetes Mother     Social History:  reports that she has never smoked. She has never used smokeless tobacco. Her alcohol and drug histories are not on file.  Review of Systems:  She has had diabetic retinopathy treated with laser and follows with Dr. Zadie Rhine , reportedly stable now  HYPERTENSION:  she has had fairly good control of her blood pressure with 1/2 Diovan HCT   History of mild renal insufficiency: Creatinine is relatively higher not clear why.  Not using any nonsteroidal anti-inflammatory drugs  HYPERLIPIDEMIA: The lipid  abnormality consists of elevated LDL. She is trying to take the medication in the morning and is better compliant with this now   Lab Results  Component Value Date   CHOL 144 10/13/2014   HDL 57.10 10/13/2014   LDLCALC 74 10/13/2014   LDLDIRECT 131.2 02/06/2014   TRIG 64.0 10/13/2014   CHOLHDL 3 10/13/2014   No symptoms of neuropathy  Mild hypothyroidism: Adequately controlled  Lab Results  Component Value Date   FREET4 0.89 10/09/2013   TSH 1.60 02/06/2014   TSH 0.44 10/09/2013   Diabetic foot exam in 11/15 showed normal monofilament sensation in the toes and plantar surfaces, no skin lesions or ulcers on the feet and normal pedal pulses      Examination:   BP 118/78 mmHg  Pulse 84  Temp(Src) 98.3 F (36.8 C)  Resp 14  Ht 5' 4.5" (1.638 m)  Wt 165 lb 6.4 oz (75.025 kg)  BMI 27.96 kg/m2  SpO2 97%  Body mass index is 27.96 kg/(m^2).   No edema  ASSESSMENT/ PLAN:   Diabetes type 2   The patient's diabetes control appears to be fair with mild increase in A1c although still under 7% She may have some postprandial increase in blood sugar which she is not monitoring Also her sugars in the mornings tend to be mildly increased also Recently starting to do more walking for exercise and is starting to lose weight Encouraged her to continue lifestyle changes  Hyperlipidemia:  LDL much better controlled with improving her compliance with simvastatin by taking it in the morning  Blood pressure controlled with low-dose Diovan HCT  High creatinine: She will discuss this with PCP, currently GFR 51  Hypothyroidism: To check levels on the next visit  Recommended Zostavax and she will check with her insurance company about coverage  [facility] 10/16/2014, 4:23 PM   Lab on 10/13/2014  Component Date Value Ref Range Status  . Hgb A1c MFr Bld 10/13/2014 6.7* 4.6 - 6.5 % Final   Glycemic Control Guidelines for People with Diabetes:Non Diabetic:  <6%Goal of Therapy:  <7%Additional Action Suggested:  >8%   . Sodium 10/13/2014 138  135 - 145 mEq/L Final  . Potassium 10/13/2014 3.9  3.5 - 5.1 mEq/L Final  . Chloride 10/13/2014 101  96 - 112 mEq/L Final  . CO2 10/13/2014 27  19 - 32 mEq/L Final  . Glucose, Bld 10/13/2014 103* 70 - 99 mg/dL Final  . BUN 10/13/2014 24* 6 - 23 mg/dL Final  . Creatinine, Ser 10/13/2014 1.34* 0.40 - 1.20 mg/dL Final  . Total Bilirubin 10/13/2014 0.6  0.2 - 1.2 mg/dL Final  . Alkaline Phosphatase 10/13/2014 85  39 - 117 U/L Final  . AST 10/13/2014 30  0 - 37 U/L Final  . ALT 10/13/2014 22  0 - 35 U/L Final  . Total Protein 10/13/2014 8.2  6.0 - 8.3 g/dL Final  . Albumin 10/13/2014 4.4  3.5 - 5.2 g/dL Final  . Calcium 10/13/2014 10.0  8.4 - 10.5 mg/dL Final  . GFR 10/13/2014 51.13* >60.00 mL/min Final  . Microalb, Ur 10/13/2014 <0.7  0.0 - 1.9 mg/dL Final  . Creatinine,U 10/13/2014 144.2   Final  . Microalb Creat Ratio 10/13/2014 0.5  0.0 - 30.0 mg/g Final  . Color, Urine 10/13/2014 YELLOW  Yellow;Lt. Yellow Final  . APPearance 10/13/2014 CLEAR  Clear Final  . Specific Gravity, Urine 10/13/2014 1.015  1.000-1.030 Final  . pH 10/13/2014 6.0  5.0 - 8.0 Final  . Total Protein, Urine 10/13/2014 NEGATIVE  Negative Final  . Urine Glucose 10/13/2014 NEGATIVE  Negative Final  . Ketones, ur 10/13/2014 NEGATIVE  Negative Final  . Bilirubin Urine 10/13/2014 NEGATIVE  Negative Final  . Hgb urine dipstick 10/13/2014 NEGATIVE  Negative Final  . Urobilinogen, UA 10/13/2014 0.2  0.0 - 1.0 Final  . Leukocytes, UA 10/13/2014 NEGATIVE  Negative Final  . Nitrite 10/13/2014 NEGATIVE  Negative Final  . WBC, UA 10/13/2014 none seen  0-2/hpf Final  . RBC / HPF 10/13/2014 none seen  0-2/hpf Final  . Squamous Epithelial / LPF 10/13/2014 Rare(0-4/hpf)  Rare(0-4/hpf) Final  . Cholesterol 10/13/2014 144  0 - 200 mg/dL Final   ATP III Classification       Desirable:  < 200 mg/dL               Borderline High:  200 - 239 mg/dL          High:  > = 240  mg/dL  . Triglycerides 10/13/2014 64.0  0.0 - 149.0 mg/dL Final   Normal:  <150 mg/dLBorderline High:  150 - 199 mg/dL  . HDL 10/13/2014 57.10  >39.00 mg/dL Final  . VLDL 10/13/2014 12.8  0.0 - 40.0 mg/dL Final  . LDL Cholesterol 10/13/2014 74  0 - 99 mg/dL Final  . Total CHOL/HDL Ratio 10/13/2014 3   Final                  Men          Women1/2 Average Risk     3.4          3.3Average Risk          5.0          4.42X Average Risk          9.6          7.13X Average Risk          15.0          11.0                      . NonHDL 10/13/2014 86.90   Final   NOTE:  Non-HDL goal should be 30 mg/dL higher than patient's LDL goal (i.e. LDL goal of < 70 mg/dL, would have non-HDL goal of < 100 mg/dL)

## 2014-11-16 ENCOUNTER — Encounter: Payer: Self-pay | Admitting: Endocrinology

## 2014-12-01 DIAGNOSIS — H35351 Cystoid macular degeneration, right eye: Secondary | ICD-10-CM | POA: Insufficient documentation

## 2014-12-01 DIAGNOSIS — H259 Unspecified age-related cataract: Secondary | ICD-10-CM | POA: Insufficient documentation

## 2014-12-01 DIAGNOSIS — H348192 Central retinal vein occlusion, unspecified eye, stable: Secondary | ICD-10-CM | POA: Insufficient documentation

## 2014-12-01 DIAGNOSIS — E113299 Type 2 diabetes mellitus with mild nonproliferative diabetic retinopathy without macular edema, unspecified eye: Secondary | ICD-10-CM | POA: Insufficient documentation

## 2014-12-30 ENCOUNTER — Other Ambulatory Visit: Payer: Self-pay | Admitting: Endocrinology

## 2015-02-12 ENCOUNTER — Other Ambulatory Visit: Payer: BLUE CROSS/BLUE SHIELD

## 2015-02-15 ENCOUNTER — Other Ambulatory Visit (INDEPENDENT_AMBULATORY_CARE_PROVIDER_SITE_OTHER): Payer: BLUE CROSS/BLUE SHIELD

## 2015-02-15 DIAGNOSIS — E038 Other specified hypothyroidism: Secondary | ICD-10-CM | POA: Diagnosis not present

## 2015-02-15 DIAGNOSIS — E119 Type 2 diabetes mellitus without complications: Secondary | ICD-10-CM | POA: Diagnosis not present

## 2015-02-15 DIAGNOSIS — E063 Autoimmune thyroiditis: Secondary | ICD-10-CM

## 2015-02-15 LAB — BASIC METABOLIC PANEL
BUN: 20 mg/dL (ref 6–23)
CO2: 28 mEq/L (ref 19–32)
Calcium: 10.5 mg/dL (ref 8.4–10.5)
Chloride: 102 mEq/L (ref 96–112)
Creatinine, Ser: 1.26 mg/dL — ABNORMAL HIGH (ref 0.40–1.20)
GFR: 54.83 mL/min — ABNORMAL LOW (ref >60.00)
Glucose, Bld: 108 mg/dL — ABNORMAL HIGH (ref 70–99)
Potassium: 4.4 mEq/L (ref 3.5–5.1)
Sodium: 140 mEq/L (ref 135–145)

## 2015-02-15 LAB — TSH: TSH: 0.85 u[IU]/mL (ref 0.35–4.50)

## 2015-02-15 LAB — HEMOGLOBIN A1C: Hgb A1c MFr Bld: 6.9 % — ABNORMAL HIGH (ref 4.6–6.5)

## 2015-02-17 ENCOUNTER — Ambulatory Visit (INDEPENDENT_AMBULATORY_CARE_PROVIDER_SITE_OTHER): Payer: BLUE CROSS/BLUE SHIELD | Admitting: Endocrinology

## 2015-02-17 ENCOUNTER — Encounter: Payer: Self-pay | Admitting: Endocrinology

## 2015-02-17 VITALS — BP 114/74 | HR 73 | Temp 98.5°F | Resp 14 | Ht 64.5 in | Wt 163.2 lb

## 2015-02-17 DIAGNOSIS — E7800 Pure hypercholesterolemia, unspecified: Secondary | ICD-10-CM

## 2015-02-17 DIAGNOSIS — E119 Type 2 diabetes mellitus without complications: Secondary | ICD-10-CM | POA: Diagnosis not present

## 2015-02-17 DIAGNOSIS — I10 Essential (primary) hypertension: Secondary | ICD-10-CM | POA: Diagnosis not present

## 2015-02-17 NOTE — Progress Notes (Signed)
Patient ID: Allison Matthews, female   DOB: 1950-06-25, 64 y.o.   MRN: [id number]   Reason for Appointment: Diabetes follow-up   History of Present Illness   Diagnosis: Type 2 DIABETES MELITUS, date of diagnosis:  2005    She was initially treated with insulin and subsequently was transitioned to [place] with excellent control Previously had upper normal A1c results and had been fairly compliant with diet and exercise Since early 2014 her A1c levels had been relatively higher Because of her higher A1c the Kombiglyze been increased to 2 tablets  in 03/2013  Recent history:  For some time she is on Kombiglyze XR maximum dose alone for her diabetes management Her A1c is consistently under 7% with some variability Her blood sugars at home are usually fairly good but she did not bring her monitor for confirmation She will only have a relatively high readings after going off her diet such as eating fried food  She has not been motivated to do any exercise since her last visit when she was trying to do some walking She thinks she is generally compliant with her diet and her weight is relatively better  Oral hypoglycemic drugs: Kombiglyze XR        Side effects from medications: None  Monitors blood glucose:  every other day .    Glucometer:  Accu-Chek      Blood Glucose readings from recall  Mean values apply above for all meters except median for One Touch  PRE-MEAL Fasting Lunch Dinner Bedtime Overall  Glucose range: <120      Mean/median:        POST-MEAL PC Breakfast PC Lunch PC Dinner  Glucose range:  140 140-170  Mean/median:         Hypoglycemia frequency: Never.           Meals: 3 meals per day. Trying to watch her diet most of the time      Physical activity: exercise: Not Walking             Dietician visit: Most recent: 2446            Complications: are: None      Wt Readings from Last 3 Encounters:  02/17/15 163 lb 3.2 oz (74.027 kg)  10/16/14  165 lb 6.4 oz (75.025 kg)  06/15/14 168 lb 3.2 oz (76.295 kg)   Diabetes Labs:  Lab Results  Component Value Date   HGBA1C 6.9* 02/15/2015   HGBA1C 6.7* 10/13/2014   HGBA1C 6.9* 06/10/2014   Lab Results  Component Value Date   MICROALBUR <0.7 10/13/2014   LDLCALC 74 10/13/2014   CREATININE 1.26* 02/15/2015       Medication List       This list is accurate as of: 02/17/15  5:07 PM.  Always use your most recent med list.               colchicine 0.6 MG tablet     fluticasone 0.05 % cream  Commonly known as:  CUTIVATE     HYDROcodone-acetaminophen 7.5-325 MG tablet  Commonly known as:  NORCO     KOMBIGLYZE XR 2.08-998 MG Tb24  Generic drug:  Saxagliptin-Metformin  TAKE 2 TABLETS DAILY     levothyroxine 50 MCG tablet  Commonly known as:  SYNTHROID, LEVOTHROID  50 mcg.     ONETOUCH DELICA LANCETS FINE Misc  Use to obtain a blood specimen 2 times per day dx code 250.02  ONETOUCH VERIO W/DEVICE Kit  1 each by In Vitro route 2 (two) times daily. Use to check blood sugars 2 times per day dx code 250.02     simvastatin 40 MG tablet  Commonly known as:  ZOCOR  TAKE 1 TABLET AT BEDTIME     valsartan-hydrochlorothiazide 160-25 MG tablet  Commonly known as:  DIOVAN-HCT        Allergies: No Known Allergies  No past medical history on file.  Past Surgical History  Procedure Laterality Date  . Abdominal hysterectomy      Family History  Problem Relation Age of Onset  . Diabetes Mother     Social History:  reports that she has never smoked. She has never used smokeless tobacco. Her alcohol and drug histories are not on file.  Review of Systems:  She has had diabetic retinopathy treated with laser and follows with Dr. Zadie Rhine , reportedly stable now  HYPERTENSION:  she has had fairly good control of her blood pressure with Diovan HCT  Previously was taking a half tablet daily with she is taking the full tablet on her own She only gets dizzy when she is  stooping down not otherwise, has also seen PCP last month for blood pressure check  History of mild renal insufficiency: Creatinine is relatively better without any changes  Lab Results  Component Value Date   CREATININE 1.26* 02/15/2015    HYPERLIPIDEMIA: The lipid abnormality consists of elevated LDL. She is trying to take the medication in the morning regularly, results as follows:   Lab Results  Component Value Date   CHOL 144 10/13/2014   HDL 57.10 10/13/2014   LDLCALC 74 10/13/2014   LDLDIRECT 131.2 02/06/2014   TRIG 64.0 10/13/2014   CHOLHDL 3 10/13/2014   No symptoms of numbness in her toes  Mild hypothyroidism: Adequately controlled with 50 g levothyroxine  Lab Results  Component Value Date   FREET4 0.89 10/09/2013   TSH 0.85 02/15/2015   TSH 1.60 02/06/2014   TSH 0.44 10/09/2013   Diabetic foot exam in 11/16 showed normal monofilament sensation in the toes and plantar surfaces, no skin lesions or ulcers on the feet and normal pedal pulses      Examination:   BP 114/74 mmHg  Pulse 73  Temp(Src) 98.5 F (36.9 C) (Oral)  Resp 14  Ht 5' 4.5" (1.638 m)  Wt 163 lb 3.2 oz (74.027 kg)  BMI 27.59 kg/m2  SpO2 99%  Body mass index is 27.59 kg/(m^2).   No edema Diabetic foot exam shows normal monofilament sensation in the toes and plantar surfaces, no skin lesions or ulcers on the feet and normal pedal pulses   ASSESSMENT/ PLAN:   Diabetes type 2   The patient's diabetes control appears to be fair with mild increase in A1c although still under 7% She may have some postprandial increase in blood sugar occasionally if she is going off her diet but by recall appears to have fairly good readings most of the time Fasting blood sugars are minimally increased  She was encouraged to start exercising which she had not been doing and she is agreeing to do Benzing at home with her DVD  Hyperlipidemia:  LDL much better controlled with improving her compliance with  simvastatin by taking it in the morning  Blood pressure controlled with  Diovan HCT, no orthostasis  High creatinine: Improved  Hypothyroidism: To continue same dose   Lasandra Batley 02/17/2015, 5:07 PM   Appointment on 02/15/2015  Component Date  Value Ref Range Status  . Sodium 02/15/2015 140  135 - 145 mEq/L Final  . Potassium 02/15/2015 4.4  3.5 - 5.1 mEq/L Final  . Chloride 02/15/2015 102  96 - 112 mEq/L Final  . CO2 02/15/2015 28  19 - 32 mEq/L Final  . Glucose, Bld 02/15/2015 108* 70 - 99 mg/dL Final  . BUN 02/15/2015 20  6 - 23 mg/dL Final  . Creatinine, Ser 02/15/2015 1.26* 0.40 - 1.20 mg/dL Final  . Calcium 02/15/2015 10.5  8.4 - 10.5 mg/dL Final  . GFR 02/15/2015 54.83* >60.00 mL/min Final  . Hgb A1c MFr Bld 02/15/2015 6.9* 4.6 - 6.5 % Final   Glycemic Control Guidelines for People with Diabetes:Non Diabetic:  <6%Goal of Therapy: <7%Additional Action Suggested:  >8%   . TSH 02/15/2015 0.85  0.35 - 4.50 uIU/mL Final

## 2015-02-17 NOTE — Patient Instructions (Signed)
Check blood sugars on waking up 1-2   times a week Also check blood sugars about 2 hours after a meal and do this after different meals by rotation  Recommended blood sugar levels on waking up is 90-130 and about 2 hours after meal is 130-160  Please bring your blood sugar monitor to each visit, thank you  

## 2015-02-22 ENCOUNTER — Other Ambulatory Visit: Payer: Self-pay | Admitting: Endocrinology

## 2015-02-24 ENCOUNTER — Other Ambulatory Visit: Payer: Self-pay | Admitting: *Deleted

## 2015-02-24 MED ORDER — GLUCOSE BLOOD VI STRP: ORAL_STRIP | Status: DC

## 2015-02-24 MED ORDER — BAYER MICROLET LANCETS MISC: Status: DC

## 2015-02-24 MED ORDER — BAYER CONTOUR NEXT MONITOR W/DEVICE KIT: PACK | Status: DC

## 2015-04-04 ENCOUNTER — Emergency Department (HOSPITAL_COMMUNITY): Payer: BLUE CROSS/BLUE SHIELD

## 2015-04-04 ENCOUNTER — Encounter (HOSPITAL_COMMUNITY): Payer: Self-pay | Admitting: Emergency Medicine

## 2015-04-04 ENCOUNTER — Emergency Department (HOSPITAL_COMMUNITY)
Admission: EM | Admit: 2015-04-04 | Discharge: 2015-04-04 | Disposition: A | Discharge status: Home / Self Care | Payer: BLUE CROSS/BLUE SHIELD | Attending: Emergency Medicine | Admitting: Emergency Medicine

## 2015-04-04 DIAGNOSIS — E039 Hypothyroidism, unspecified: Secondary | ICD-10-CM | POA: Insufficient documentation

## 2015-04-04 DIAGNOSIS — M7732 Calcaneal spur, left foot: Secondary | ICD-10-CM

## 2015-04-04 DIAGNOSIS — Z79899 Other long term (current) drug therapy: Secondary | ICD-10-CM | POA: Diagnosis not present

## 2015-04-04 DIAGNOSIS — M10072 Idiopathic gout, left ankle and foot: Secondary | ICD-10-CM | POA: Diagnosis not present

## 2015-04-04 DIAGNOSIS — M79672 Pain in left foot: Secondary | ICD-10-CM

## 2015-04-04 DIAGNOSIS — E785 Hyperlipidemia, unspecified: Secondary | ICD-10-CM | POA: Insufficient documentation

## 2015-04-04 DIAGNOSIS — E119 Type 2 diabetes mellitus without complications: Secondary | ICD-10-CM | POA: Insufficient documentation

## 2015-04-04 DIAGNOSIS — Z7984 Long term (current) use of oral hypoglycemic drugs: Secondary | ICD-10-CM | POA: Insufficient documentation

## 2015-04-04 DIAGNOSIS — M109 Gout, unspecified: Secondary | ICD-10-CM

## 2015-04-04 DIAGNOSIS — I10 Essential (primary) hypertension: Secondary | ICD-10-CM | POA: Diagnosis not present

## 2015-04-04 DIAGNOSIS — M25572 Pain in left ankle and joints of left foot: Secondary | ICD-10-CM

## 2015-04-04 MED ORDER — HYDROCODONE-ACETAMINOPHEN 5-325 MG PO TABS: 1.0000 | ORAL_TABLET | Freq: Four times a day (QID) | ORAL | Status: DC | PRN

## 2015-04-04 MED ORDER — HYDROCODONE-ACETAMINOPHEN 5-325 MG PO TABS
1.0000 | ORAL_TABLET | Freq: Once | ORAL | Status: AC
  Administered 2015-04-04: 1 via ORAL
  Filled 2015-04-04: qty 1

## 2015-04-04 MED ORDER — PREDNISONE 20 MG PO TABS: ORAL_TABLET | ORAL | Status: DC

## 2015-04-04 MED ORDER — NAPROXEN 500 MG PO TABS: 500.0000 mg | ORAL_TABLET | Freq: Two times a day (BID) | ORAL | Status: DC | PRN

## 2015-04-04 NOTE — ED Notes (Signed)
Pt ambulating independently w/ steady gait on d/c in no acute distress, A&Ox4. D/c instructions reviewed w/ pt and family - pt and family deny any further questions or concerns at present. Rx given x3  
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
## 2015-04-04 NOTE — Discharge Instructions (Signed)
Use crutches as needed for comfort. Ice and elevate ankle throughout the day. Alternate between naprosyn and norco for pain relief. Do not drive or operate machinery with pain medication use. Use prednisone as directed for gout flare. This will cause your blood sugar to go up, which is temporary and will resolve after finishing prednisone. Stay well hydrated while taking these medications. Follow up with your regular doctor in 3-5 days for recheck of symptoms. Return to the ER for changes or worsening symptoms.    Gout Gout is when your joints become red, sore, and swell (inflamed). This is caused by the buildup of uric acid crystals in the joints. Uric acid is a chemical that is normally in the blood. If the level of uric acid gets too high in the blood, these crystals form in your joints and tissues. Over time, these crystals can form into masses near the joints and tissues. These masses can destroy bone and cause the bone to look misshapen (deformed). HOME CARE   Do not take aspirin for pain.  Only take medicine as told by your doctor.  Rest the joint as much as you can. When in bed, keep sheets and blankets off painful areas.  Keep the sore joints raised (elevated).  Put warm or cold packs on painful joints. Use of warm or cold packs depends on which works best for you.  Use crutches if the painful joint is in your leg.  Drink enough fluids to keep your pee (urine) clear or pale yellow. Limit alcohol, sugary drinks, and drinks with fructose in them.  Follow your diet instructions. Pay careful attention to how much protein you eat. Include fruits, vegetables, whole grains, and fat-free or low-fat milk products in your daily diet. Talk to your doctor or dietitian about the use of coffee, vitamin C, and cherries. These may help lower uric acid levels.  Keep a healthy body weight. GET HELP RIGHT AWAY IF:   You have watery poop (diarrhea), throw up (vomit), or have any side effects from  medicines.  You do not feel better in 24 hours, or you are getting worse.  Your joint becomes suddenly more tender, and you have chills or a fever. MAKE SURE YOU:   Understand these instructions.  Will watch your condition.  Will get help right away if you are not doing well or get worse.   This information is not intended to replace advice given to you by your health care provider. Make sure you discuss any questions you have with your health care provider.   Document Released: 01/04/2008 Document Revised: 04/17/2014 Document Reviewed: 11/08/2011 Elsevier Interactive Patient Education 2016 Else[employer] Inc.  Ankle Pain Ankle pain is a common symptom. The bones, cartilage, tendons, and muscles of the ankle joint perform a lot of work each day. The ankle joint holds your body weight and allows you to move around. Ankle pain can occur on either side or back of 1 or both ankles. Ankle pain may be sharp and burning or dull and aching. There may be tenderness, stiffness, redness, or warmth around the ankle. The pain occurs more often when a person walks or puts pressure on the ankle. CAUSES  There are many reasons ankle pain can develop. It is important to work with your caregiver to identify the cause since many conditions can impact the bones, cartilage, muscles, and tendons. Causes for ankle pain include:  Injury, including a break (fracture), sprain, or strain often due to a fall, sports, or  a high-impact activity.  Swelling (inflammation) of a tendon (tendonitis).  Achilles tendon rupture.  Ankle instability after repeated sprains and strains.  Poor foot alignment.  Pressure on a nerve (tarsal tunnel syndrome).  Arthritis in the ankle or the lining of the ankle.  Crystal formation in the ankle (gout or pseudogout). DIAGNOSIS  A diagnosis is based on your medical history, your symptoms, results of your physical exam, and results of diagnostic tests. Diagnostic tests may include  X-ray exams or a computerized magnetic scan (magnetic resonance imaging, MRI). TREATMENT  Treatment will depend on the cause of your ankle pain and may include:  Keeping pressure off the ankle and limiting activities.  Using crutches or other walking support (a cane or brace).  Using rest, ice, compression, and elevation.  Participating in physical therapy or home exercises.  Wearing shoe inserts or special shoes.  Losing weight.  Taking medications to reduce pain or swelling or receiving an injection.  Undergoing surgery. HOME CARE INSTRUCTIONS   Only take over-the-counter or prescription medicines for pain, discomfort, or fever as directed by your caregiver.  Put ice on the injured area.  Put ice in a plastic bag.  Place a towel between your skin and the bag.  Leave the ice on for 15-20 minutes at a time, 03-04 times a day.  Keep your leg raised (elevated) when possible to lessen swelling.  Avoid activities that cause ankle pain.  Follow specific exercises as directed by your caregiver.  Record how often you have ankle pain, the location of the pain, and what it feels like. This information may be helpful to you and your caregiver.  Ask your caregiver about returning to work or sports and whether you should drive.  Follow up with your caregiver for further examination, therapy, or testing as directed. SEEK MEDICAL CARE IF:   Pain or swelling continues or worsens beyond 1 week.  You have an oral temperature above 102 F (38.9 C).  You are feeling unwell or have chills.  You are having an increasingly difficult time with walking.  You have loss of sensation or other new symptoms.  You have questions or concerns. MAKE SURE YOU:   Understand these instructions.  Will watch your condition.  Will get help right away if you are not doing well or get worse.   This information is not intended to replace advice given to you by your health care provider. Make  sure you discuss any questions you have with your health care provider.   Document Released: 09/14/2009 Document Revised: 06/19/2011 Document Reviewed: 10/27/2014 Elsevier Interactive Patient Education 2016 Elsevier Inc.  Cryotherapy Cryotherapy is when you put ice on your injury. Ice helps lessen pain and puffiness (swelling) after an injury. Ice works the best when you start using it in the first 24 to 48 hours after an injury. HOME CARE  Put a dry or damp towel between the ice pack and your skin.  You may press gently on the ice pack.  Leave the ice on for no more than 10 to 20 minutes at a time.  Check your skin after 5 minutes to make sure your skin is okay.  Rest at least 20 minutes between ice pack uses.  Stop using ice when your skin loses feeling (numbness).  Do not use ice on someone who cannot tell you when it hurts. This includes small children and people with memory problems (dementia). GET HELP RIGHT AWAY IF:  You have white spots on your  skin.  Your skin turns blue or pale.  Your skin feels waxy or hard.  Your puffiness gets worse. MAKE SURE YOU:   Understand these instructions.  Will watch your condition.  Will get help right away if you are not doing well or get worse.   This information is not intended to replace advice given to you by your health care provider. Make sure you discuss any questions you have with your health care provider.   Document Released: 09/13/2007 Document Revised: 06/19/2011 Document Reviewed: 11/17/2010 Elsevier Interactive Patient Education 2016 Elsevier Inc.  Low-Purine Diet Purines are compounds that affect the level of uric acid in your body. A low-purine diet is a diet that is low in purines. Eating a low-purine diet can prevent the level of uric acid in your body from getting too high and causing gout or kidney stones or both. WHAT DO I NEED TO KNOW ABOUT THIS DIET?  Choose low-purine foods. Examples of low-purine foods  are listed in the next section.  Drink plenty of fluids, especially water. Fluids can help remove uric acid from your body. Try to drink 8-16 cups (1.9-3.8 L) a day.  Limit foods high in fat, especially saturated fat, as fat makes it harder for the body to get rid of uric acid. Foods high in saturated fat include pizza, cheese, ice cream, whole milk, fried foods, and gravies. Choose foods that are lower in fat and lean sources of protein. Use olive oil when cooking as it contains healthy fats that are not high in saturated fat.  Limit alcohol. Alcohol interferes with the elimination of uric acid from your body. If you are having a gout attack, avoid all alcohol.  Keep in mind that different people's bodies react differently to different foods. You will probably learn over time which foods do or do not affect you. If you discover that a food tends to cause your gout to flare up, avoid eating that food. You can more freely enjoy foods that do not cause problems. If you have any questions about a food item, talk to your dietitian or health care provider. WHICH FOODS ARE LOW, MODERATE, AND HIGH IN PURINES? The following is a list of foods that are low, moderate, and high in purines. You can eat any amount of the foods that are low in purines. You may be able to have small amounts of foods that are moderate in purines. Ask your health care provider how much of a food moderate in purines you can have. Avoid foods high in purines. Grains  Foods low in purines: Enriched white bread, pasta, rice, cake, cornbread, popcorn.  Foods moderate in purines: Whole-grain breads and cereals, wheat germ, bran, oatmeal. Uncooked oatmeal. Dry wheat bran or wheat germ.  Foods high in purines: Pancakes, [place] toast, biscuits, muffins. Vegetables  Foods low in purines: All vegetables, except those that are moderate in purines.  Foods moderate in purines: Asparagus, cauliflower, spinach, mushrooms, green  peas. Fruits  All fruits are low in purines. Meats and other Protein Foods  Foods low in purines: Eggs, nuts, peanut butter.  Foods moderate in purines: 80-90% lean beef, lamb, veal, pork, poultry, fish, eggs, peanut butter, nuts. Crab, lobster, oysters, and shrimp. Cooked dried beans, peas, and lentils.  Foods high in purines: Anchovies, sardines, herring, mussels, tuna, codfish, scallops, trout, and haddock. Allison Matthews. Organ meats (such as liver or kidney). Tripe. Game meat. Goose. Sweetbreads. Dairy  All dairy foods are low in purines. Low-fat and fat-free dairy  products are best because they are low in saturated fat. Beverages  Drinks low in purines: Water, carbonated beverages, tea, coffee, cocoa.  Drinks moderate in purines: Soft drinks and other drinks sweetened with high-fructose corn syrup. Juices. To find whether a food or drink is sweetened with high-fructose corn syrup, look at the ingredients list.  Drinks high in purines: Alcoholic beverages (such as beer). Condiments  Foods low in purines: Salt, herbs, olives, pickles, relishes, vinegar.  Foods moderate in purines: Butter, margarine, oils, mayonnaise. Fats and Oils  Foods low in purines: All types, except gravies and sauces made with meat.  Foods high in purines: Gravies and sauces made with meat. Other Foods  Foods low in purines: Sugars, sweets, gelatin. Cake. Soups made without meat.  Foods moderate in purines: Meat-based or fish-based soups, broths, or bouillons. Foods and drinks sweetened with high-fructose corn syrup.  Foods high in purines: High-fat desserts (such as ice cream, cookies, cakes, pies, doughnuts, and chocolate). Contact your dietitian for more information on foods that are not listed here.   This information is not intended to replace advice given to you by your health care provider. Make sure you discuss any questions you have with your health care provider.   Document Released: 07/22/2010  Document Revised: 04/01/2013 Document Reviewed: 03/03/2013 Elsevier Interactive Patient Education 2016 Elsevier Inc.  Heel Spur A heel spur is a bony growth that forms on the bottom of your heel bone (calcaneus). Heel spurs are common and do not always cause pain. However, heel spurs often cause inflammation in the strong band of tissue that runs underneath the bone of your foot (plantar fascia). When this happens, you may feel pain on the bottom of your foot, near your heel.  CAUSES  The cause of heel spurs is not completely understood. They may be caused by pressure on the heel. Or, they may stem from the muscle attachments (tendons) near the spur pulling on the heel.  RISK FACTORS You may be at risk for a heel spur if you:  Are older than 40.  Are overweight.  Have wear and tear arthritis (osteoarthritis).  Have plantar fascia inflammation. SIGNS AND SYMPTOMS  Some people have heel spurs but no symptoms. If you do have symptoms, they may include:   Pain in the bottom of your heel.  Pain that is worse when you first get out of bed.  Pain that gets worse after walking or standing. DIAGNOSIS  Your health care provider may diagnose a heel spur based on your symptoms and a physical exam. You may also have an X-ray of your foot to check for a bony growth coming from the calcaneus.  TREATMENT Treatment aims to relieve the pain from the heel spur. This may include:  Stretching exercises.  Losing weight.  Wearing specific shoes, inserts, or orthotics for comfort and support.  Wearing splints at night to properly position your feet.  Taking over-the-counter medicine to relieve pain.  Being treated with high-intensity sound waves to break up the heel spur (extracorporeal shock wave therapy).  Getting steroid injections in your heel to reduce swelling and ease pain.  Having surgery if your heel spur causes long-term (chronic) pain. HOME CARE INSTRUCTIONS   Take medicines only  as directed by your health care provider.  Ask your health care provider if you should use ice or cold packs on the painful areas of your heel or foot.  Avoid activities that cause you pain until you recover or as directed by  your health care provider.  Stretch before exercising or being physically active.  Wear supportive shoes that fit well as directed by your health care provider. You might need to buy new shoes. Wearing old shoes or shoes that do not fit correctly may not provide the support that you need.  Lose weight if your health care provider thinks you should. This can relieve pressure on your foot that may be causing pain and discomfort. SEEK MEDICAL CARE IF:   Your pain continues or gets worse.   This information is not intended to replace advice given to you by your health care provider. Make sure you discuss any questions you have with your health care provider.   Document Released: 05/03/2005 Document Revised: 04/17/2014 Document Reviewed: 05/28/2013 Elsevier Interactive Patient Education [employer]

## 2015-04-04 NOTE — ED Provider Notes (Signed)
CSN: [id number]     Arrival date & time 04/04/15  0235 History   First MD Initiated Contact with Patient 04/04/15 [phone]     Chief Complaint  Patient presents with  . Foot Pain     (Consider location/radiation/quality/duration/timing/severity/associated sxs/prior Treatment) HPI Comments: Allison Matthews is a 64 y.o. female with a PMHx of gout, DM2, hypothyroidism, HLD, and HTN, who presents to the ED with complaints of left ankle and foot pain that began 3 days ago. She denies any injury or trauma to the area. She reports the pain is 10/10 constant sharp pain in the left posterior heel and ankle, nonradiating, worse with walking, and unrelieved with Tylenol and ice. Associated symptoms include swelling. She is a history of gout, she does not take any prophylactic medications but she is taking colchicine for flares in the past. She denies any recent red meat, alcohol use, or seafood consumption. She states this feels somewhat similar to her prior gout flares. She denies any fevers, chills, chest pain, shortness breath, abdominal pain, nausea, vomiting, diarrhea, constipation, dysuria, hematuria, numbness, tingling, weakness, loss of range of motion, erythema, or warmth. No skin injury.  Patient is a 64 y.o. female presenting with ankle pain. The history is provided by the patient. No language interpreter was used.  Ankle Pain Location:  Ankle and foot Time since incident:  3 days Injury: no   Ankle location:  L ankle Foot location:  L foot Pain details:    Quality:  Sharp   Radiates to:  Does not radiate   Severity:  Severe   Onset quality:  Gradual   Duration:  3 days   Timing:  Constant   Progression:  Unchanged Chronicity:  Recurrent Prior injury to area:  No Relieved by:  Nothing Worsened by:  Bearing weight and activity Ineffective treatments:  Ice and acetaminophen Associated symptoms: swelling   Associated symptoms: no decreased ROM, no fever, no muscle weakness, no numbness  and no tingling     History reviewed. No pertinent past medical history. Past Surgical History  Procedure Laterality Date  . Abdominal hysterectomy     Family History  Problem Relation Age of Onset  . Diabetes Mother    Social History  Substance Use Topics  . Smoking status: Never Smoker   . Smokeless tobacco: Never Used  . Alcohol Use: None   OB History    No data available     Review of Systems  Constitutional: Negative for fever and chills.  Respiratory: Negative for shortness of breath.   Cardiovascular: Negative for chest pain.  Gastrointestinal: Negative for nausea, vomiting, abdominal pain, diarrhea and constipation.  Genitourinary: Negative for dysuria, hematuria, vaginal bleeding and vaginal discharge.  Musculoskeletal: Positive for joint swelling and arthralgias (L ankle/foot). Negative for myalgias.  Skin: Negative for color change.  Allergic/Immunologic: Positive for immunocompromised state (diabetic).  Neurological: Negative for weakness and numbness.  Psychiatric/Behavioral: Negative for confusion.   10 Systems reviewed and are negative for acute change except as noted in the HPI.    Allergies  Review of patient's allergies indicates no known allergies.  Home Medications   Prior to Admission medications   Medication Sig Start Date End Date Taking? Authorizing Provider  BAYER MICROLET LANCETS lancets Use as instructed to check blood sugar 2 times per day dx code E11.65 02/24/15   Elayne Snare, MD  Blood Glucose Monitoring Suppl (BAYER CONTOUR NEXT MONITOR) W/DEVICE KIT Use to check blood sugar 2 times per day dx code E11.65  02/24/15   Elayne Snare, MD  colchicine 0.6 MG tablet  03/26/14   Historical Provider, MD  fluticasone (CUTIVATE) 0.05 % cream  11/10/13   Historical Provider, MD  glucose blood (BAYER CONTOUR NEXT TEST) test strip Use as instructed to check blood sugar 2 times per day dx codeE11.65 02/24/15   Elayne Snare, MD  HYDROcodone-acetaminophen  [facility]) 7.5-325 MG per tablet  03/27/13   Historical Provider, MD  KOMBIGLYZE XR 2.08-998 MG TB24 TAKE 2 TABLETS DAILY 12/30/14   Elayne Snare, MD  levothyroxine (SYNTHROID, LEVOTHROID) 50 MCG tablet 50 mcg. 11/27/12   Historical Provider, MD  simvastatin (ZOCOR) 40 MG tablet TAKE 1 TABLET AT BEDTIME 01/26/14   Elayne Snare, MD  valsartan-hydrochlorothiazide (DIOVAN-HCT) 160-25 MG per tablet  01/16/14   Historical Provider, MD   BP 149/88 mmHg  Pulse 88  Temp(Src) 97.8 F (36.6 C) (Oral)  Resp 18  Ht _0  (1.626 m)  Wt 75.751 kg  BMI 28.65 kg/m2  SpO2 98% Physical Exam  Constitutional: She is oriented to person, place, and time. Vital signs are normal. She appears well-developed and well-nourished.  Non-toxic appearance. No distress.  Afebrile, nontoxic, NAD  HENT:  Head: Normocephalic and atraumatic.  Mouth/Throat: Mucous membranes are normal.  Eyes: Conjunctivae and EOM are normal. Right eye exhibits no discharge. Left eye exhibits no discharge.  Neck: Normal range of motion. Neck supple.  Cardiovascular: Normal rate and intact distal pulses.   Pulmonary/Chest: Effort normal. No respiratory distress.  Abdominal: Normal appearance. She exhibits no distension.  Musculoskeletal: Normal range of motion.       Left ankle: She exhibits swelling. She exhibits normal range of motion, no deformity, no laceration and normal pulse. Tenderness. Achilles tendon normal.       Feet:  L ankle with FROM intact, with mild swelling to lateral ankle, no deformity, with mild TTP to posterior calcanus extending towards lateral malleolus but no focal bony TTP or swelling of fore foot or calf. No break in skin. No bruising or erythema. Minimal warmth. Achilles intact. Good pedal pulse and cap refill of all toes. Wiggling toes without difficulty. Sensation grossly intact.   Neurological: She is alert and oriented to person, place, and time. She has normal strength. No sensory deficit.  Skin: Skin is warm, dry and  intact. No rash noted.  Psychiatric: She has a normal mood and affect. Her behavior is normal.  Nursing note and vitals reviewed.   ED Course  Procedures (including critical care time) Labs Review Labs Reviewed - No data to display  Imaging Review Dg Foot Complete Left  04/04/2015  CLINICAL DATA:  Diffuse LEFT foot pain for 2 days, no injury. EXAM: LEFT FOOT - COMPLETE 3+ VIEW COMPARISON:  LEFT foot radiograph Aug 30, 2010 FINDINGS: No acute fracture deformity or dislocation. Tiny plantar calcaneal spur is unchanged. Joint space intact without erosions. No destructive bony lesions. Soft tissue planes are not suspicious. Punctate calcification or foreign body within the mid foot plantar soft tissues was present on prior foot radiograph. IMPRESSION: No acute fracture deformity or dislocation; stable appearance from Aug 30, 2010. Electronically Signed   By: Elon Alas M.D.   On: 04/04/2015 03:08   I have personally reviewed and evaluated these images and lab results as part of my medical decision-making.   EKG Interpretation None      MDM   Final diagnoses:  Left ankle pain  Left foot pain  Calcaneal spur, left  Acute gout of left foot,  unspecified cause    64 y.o. female here with L ankle pain/posterior heel pain/foot pain x3 days. +Hx of gout. NVI with soft compartments. Some mild warmth, no erythema. Mild swelling to posterior ankle. Xray obtained in triage which shows calcaneal bone spur, could be causing some tendinitis, but given hx of gout, this is likely the cause. Doubt septic joint. Recent labs at her PCPs office show Cr 1.2-1.3, CrCl 53, want to avoid giving colchicine and NSAIDs at the same time therefore will opt for prednisone for gout flare. Discussed that this will cause transiently higher CBGs, discussed staying hydrated. RICE discussed. Crutches given. Pain meds given. F/up with PCP in 1wk. I explained the diagnosis and have given explicit precautions to return to  the ER including for any other new or worsening symptoms. The patient understands and accepts the medical plan as it's been dictated and I have answered their questions. Discharge instructions concerning home care and prescriptions have been given. The patient is STABLE and is discharged to home in good condition.   BP 149/88 mmHg  Pulse 88  Temp(Src) 97.8 F (36.6 C) (Oral)  Resp 18  Ht _0  (1.626 m)  Wt 75.751 kg  BMI 28.65 kg/m2  SpO2 98%  Meds ordered this encounter  Medications  . HYDROcodone-acetaminophen (NORCO/VICODIN) 5-325 MG per tablet 1 tablet    Sig:   . HYDROcodone-acetaminophen (NORCO) 5-325 MG tablet    Sig: Take 1 tablet by mouth every 6 (six) hours as needed for severe pain.    Dispense:  6 tablet    Refill:  0    Order Specific Question:  Supervising Provider    Answer:  MILLER, BRIAN [[id number]]  . naproxen (NAPROSYN) 500 MG tablet    Sig: Take 1 tablet (500 mg total) by mouth 2 (two) times daily as needed for mild pain, moderate pain or headache (TAKE WITH MEALS.).    Dispense:  20 tablet    Refill:  0    Order Specific Question:  Supervising Provider    Answer:  MILLER, BRIAN [[id number]]  . predniSONE (DELTASONE) 20 MG tablet    Sig: 2 tabs po daily x 5 days    Dispense:  10 tablet    Refill:  0    Order Specific Question:  Supervising Provider    Answer:  Noemi Chapel [[id number]]       Senai Ramnath Camprubi-Soms, PA-C 04/04/15 1950  Everlene Balls, MD 04/04/15 [phone]

## 2015-04-08 ENCOUNTER — Other Ambulatory Visit: Payer: Self-pay | Admitting: Endocrinology

## 2015-05-17 ENCOUNTER — Other Ambulatory Visit (INDEPENDENT_AMBULATORY_CARE_PROVIDER_SITE_OTHER): Payer: BLUE CROSS/BLUE SHIELD

## 2015-05-17 DIAGNOSIS — E119 Type 2 diabetes mellitus without complications: Secondary | ICD-10-CM | POA: Diagnosis not present

## 2015-05-17 DIAGNOSIS — E7800 Pure hypercholesterolemia, unspecified: Secondary | ICD-10-CM

## 2015-05-17 LAB — BASIC METABOLIC PANEL
BUN: 19 mg/dL (ref 6–23)
CO2: 29 mEq/L (ref 19–32)
Calcium: 9.5 mg/dL (ref 8.4–10.5)
Chloride: 101 mEq/L (ref 96–112)
Creatinine, Ser: 1.13 mg/dL (ref 0.40–1.20)
GFR: 62.13 mL/min (ref >60.00)
Glucose, Bld: 141 mg/dL — ABNORMAL HIGH (ref 70–99)
Potassium: 4 mEq/L (ref 3.5–5.1)
Sodium: 139 mEq/L (ref 135–145)

## 2015-05-17 LAB — LIPID PANEL
Cholesterol: 212 mg/dL — ABNORMAL HIGH (ref 0–200)
HDL: 64.3 mg/dL (ref >39.00)
LDL Cholesterol: 131 mg/dL — ABNORMAL HIGH (ref 0–99)
NonHDL: 147.36
Total CHOL/HDL Ratio: 3
Triglycerides: 81 mg/dL (ref 0.0–149.0)
VLDL: 16.2 mg/dL (ref 0.0–40.0)

## 2015-05-17 LAB — HEMOGLOBIN A1C: Hgb A1c MFr Bld: 7.2 % — ABNORMAL HIGH (ref 4.6–6.5)

## 2015-05-20 ENCOUNTER — Ambulatory Visit (INDEPENDENT_AMBULATORY_CARE_PROVIDER_SITE_OTHER): Payer: BLUE CROSS/BLUE SHIELD | Admitting: Endocrinology

## 2015-05-20 ENCOUNTER — Encounter: Payer: Self-pay | Admitting: Endocrinology

## 2015-05-20 VITALS — BP 118/80 | HR 75 | Temp 97.5°F | Ht 64.5 in | Wt 166.0 lb

## 2015-05-20 DIAGNOSIS — E7800 Pure hypercholesterolemia, unspecified: Secondary | ICD-10-CM

## 2015-05-20 DIAGNOSIS — E038 Other specified hypothyroidism: Secondary | ICD-10-CM | POA: Diagnosis not present

## 2015-05-20 DIAGNOSIS — E1165 Type 2 diabetes mellitus with hyperglycemia: Secondary | ICD-10-CM

## 2015-05-20 DIAGNOSIS — E063 Autoimmune thyroiditis: Secondary | ICD-10-CM

## 2015-05-20 NOTE — Patient Instructions (Signed)
Restart walking  Take 2 Kombiglyze together once daily

## 2015-05-20 NOTE — Progress Notes (Signed)
Patient ID: Allison Matthews, female   DOB: December 29, 1950, 65 y.o.   MRN: [id number]   Reason for Appointment: Diabetes follow-up   History of Present Illness   Diagnosis: Type 2 DIABETES MELITUS, date of diagnosis:  2005    She was initially treated with insulin and subsequently was transitioned to [place] XR with excellent control Previously had upper normal A1c results and had been fairly compliant with diet and exercise Since early 2014 her A1c levels had been relatively higher Because of her higher A1c the Kombiglyze been increased to 2 tablets  in 03/2013  Recent history:   Oral hypoglycemic drugs: Kombiglyze XR 2.08/998, 1 tablet daily         For some time she is on Kombiglyze XR as monotherapy Her A1c previously was consistently under 7% and now it is 7.2% She now says that she is taking only 1 tablet daily instead of 2 and did not know that she could take them both together  Her blood sugars at home are usually fairly good but appears to have relatively high fasting readings including on the lab She is checking readings after meals especially supper only occasionally  She has not been motivated to do any exercise since her last visit  She thinks she is generally compliant with her diet  She says her sugars may have been higher in December with prednisone No change in weight   Side effects from medications: None  Monitors blood glucose:  every other day .    Glucometer:  Accu-Chek      Blood Glucose readings from recall  Mean values apply above for all meters except median for One Touch  PRE-MEAL Fasting Lunch Dinner Bedtime Overall  Glucose range:  120-173    79-123   99    Mean/median:  138      121   Hypoglycemia frequency: Never.           Meals: 3 meals per day. Trying to watch her diet most of the time      Physical activity: exercise: Not Walking             Dietician visit: Most recent: 1829            Complications: are: None      Wt  Readings from Last 3 Encounters:  05/20/15 166 lb (75.297 kg)  04/04/15 167 lb (75.751 kg)  02/17/15 163 lb 3.2 oz (74.027 kg)   Diabetes Labs:  Lab Results  Component Value Date   HGBA1C 7.2* 05/17/2015   HGBA1C 6.9* 02/15/2015   HGBA1C 6.7* 10/13/2014   Lab Results  Component Value Date   MICROALBUR <0.7 10/13/2014   LDLCALC 131* 05/17/2015   CREATININE 1.13 05/17/2015       Medication List       This list is accurate as of: 05/20/15  9:13 PM.  Always use your most recent med list.               BAYER CONTOUR NEXT MONITOR w/Device Kit  Use to check blood sugar 2 times per day dx code E11.65     BAYER MICROLET LANCETS lancets  Use as instructed to check blood sugar 2 times per day dx code E11.65     colchicine 0.6 MG tablet  Reported on 05/20/2015     fluticasone 0.05 % cream  Commonly known as:  CUTIVATE     glucose blood test strip  Commonly known as:  BAYER CONTOUR NEXT  TEST  Use as instructed to check blood sugar 2 times per day dx codeE11.65     ACCU-CHEK AVIVA PLUS test strip  Generic drug:  glucose blood  USE AS INSTRUCTED TO CHECK BLOOD SUGAR 2 TIMES PER DAY DX CODE 250.02     HYDROcodone-acetaminophen 7.5-325 MG tablet  Commonly known as:  NORCO  Reported on 05/20/2015     HYDROcodone-acetaminophen 5-325 MG tablet  Commonly known as:  NORCO  Take 1 tablet by mouth every 6 (six) hours as needed for severe pain.     KOMBIGLYZE XR 2.08-998 MG Tb24  Generic drug:  Saxagliptin-Metformin  TAKE 2 TABLETS DAILY     levothyroxine 50 MCG tablet  Commonly known as:  SYNTHROID, LEVOTHROID  50 mcg.     simvastatin 40 MG tablet  Commonly known as:  ZOCOR  TAKE 1 TABLET AT BEDTIME     valsartan-hydrochlorothiazide 160-25 MG tablet  Commonly known as:  DIOVAN-HCT        Allergies: No Known Allergies  No past medical history on file.  Past Surgical History  Procedure Laterality Date  . Abdominal hysterectomy      Family History  Problem  Relation Age of Onset  . Diabetes Mother     Social History:  reports that she has never smoked. She has never used smokeless tobacco. Her alcohol and drug histories are not on file.  Review of Systems:  She has had diabetic retinopathy treated with laser and follows with Dr. Zadie Rhine , reportedly stable now  HYPERTENSION:  she has had fairly good control of her blood pressure with Diovan HCT, half tablet daily   History of mild renal insufficiency: Creatinine is consistently normal   Lab Results  Component Value Date   CREATININE 1.13 05/17/2015    HYPERLIPIDEMIA: The lipid abnormality consists of elevated LDL. She is irregular with medications and is not motivated to take it.  Even though she was told to take in the morning she thinks it has to be taken at bedtime when she forgets   Lab Results  Component Value Date   CHOL 212* 05/17/2015   HDL 64.30 05/17/2015   LDLCALC 131* 05/17/2015   LDLDIRECT 131.2 02/06/2014   TRIG 81.0 05/17/2015   CHOLHDL 3 05/17/2015    Mild hypothyroidism: Adequately controlled with 50 g levothyroxine  Lab Results  Component Value Date   FREET4 0.89 10/09/2013   TSH 0.85 02/15/2015   TSH 1.60 02/06/2014   TSH 0.44 10/09/2013   Diabetic foot exam in 11/16 showed normal monofilament sensation in the toes and plantar surfaces, no skin lesions or ulcers on the feet and normal pedal pulses      Examination:   BP 118/80 mmHg  Pulse 75  Temp(Src) 97.5 F (36.4 C) (Oral)  Ht 5' 4.5" (1.638 m)  Wt 166 lb (75.297 kg)  BMI 28.06 kg/m2  SpO2 99%  Body mass index is 28.06 kg/(m^2).   No edema    ASSESSMENT/ PLAN:   Diabetes type 2   The patient's diabetes control appears to be somewhat worse with A1c now 7.2 and higher than usual This is likely to be from her taking only half of the prescribed dose of Kombiglyze XR. Also has had occasional steroid Generally not motivated to exercise Fasting blood sugars are relatively higher than  the rest of the day She would benefit from increasing her medication especially the metformin  She was encouraged to start exercising with walking which she likes to do  Hyperlipidemia:  LDL much higher with her again been noncompliant with her medication Reinstructed on taking the medication in the morning for better compliance  Blood pressure controlled with  Diovan HCT    Avril Busser 05/20/2015, 9:13 PM   Lab on 05/17/2015  Component Date Value Ref Range Status  . Hgb A1c MFr Bld 05/17/2015 7.2* 4.6 - 6.5 % Final   Glycemic Control Guidelines for People with Diabetes:Non Diabetic:  <6%Goal of Therapy: <7%Additional Action Suggested:  >8%   . Sodium 05/17/2015 139  135 - 145 mEq/L Final  . Potassium 05/17/2015 4.0  3.5 - 5.1 mEq/L Final  . Chloride 05/17/2015 101  96 - 112 mEq/L Final  . CO2 05/17/2015 29  19 - 32 mEq/L Final  . Glucose, Bld 05/17/2015 141* 70 - 99 mg/dL Final  . BUN 05/17/2015 19  6 - 23 mg/dL Final  . Creatinine, Ser 05/17/2015 1.13  0.40 - 1.20 mg/dL Final  . Calcium 05/17/2015 9.5  8.4 - 10.5 mg/dL Final  . GFR 05/17/2015 62.13  >60.00 mL/min Final  . Cholesterol 05/17/2015 212* 0 - 200 mg/dL Final   ATP III Classification       Desirable:  < 200 mg/dL               Borderline High:  200 - 239 mg/dL          High:  > = 240 mg/dL  . Triglycerides 05/17/2015 81.0  0.0 - 149.0 mg/dL Final   Normal:  <150 mg/dLBorderline High:  150 - 199 mg/dL  . HDL 05/17/2015 64.30  >39.00 mg/dL Final  . VLDL 05/17/2015 16.2  0.0 - 40.0 mg/dL Final  . LDL Cholesterol 05/17/2015 131* 0 - 99 mg/dL Final  . Total CHOL/HDL Ratio 05/17/2015 3   Final                  Men          Women1/2 Average Risk     3.4          3.3Average Risk          5.0          4.42X Average Risk          9.6          7.13X Average Risk          15.0          11.0                      . NonHDL 05/17/2015 147.36   Final   NOTE:  Non-HDL goal should be 30 mg/dL higher than patient's LDL goal (i.e. LDL  goal of < 70 mg/dL, would have non-HDL goal of < 100 mg/dL)

## 2015-05-31 ENCOUNTER — Other Ambulatory Visit: Payer: Self-pay | Admitting: Endocrinology

## 2015-06-18 ENCOUNTER — Encounter: Payer: Self-pay | Admitting: Podiatry

## 2015-06-18 ENCOUNTER — Ambulatory Visit (INDEPENDENT_AMBULATORY_CARE_PROVIDER_SITE_OTHER): Payer: BLUE CROSS/BLUE SHIELD | Admitting: Podiatry

## 2015-06-18 ENCOUNTER — Ambulatory Visit (INDEPENDENT_AMBULATORY_CARE_PROVIDER_SITE_OTHER): Payer: BLUE CROSS/BLUE SHIELD

## 2015-06-18 VITALS — BP 110/73 | HR 98 | Resp 12

## 2015-06-18 DIAGNOSIS — M779 Enthesopathy, unspecified: Secondary | ICD-10-CM

## 2015-06-18 DIAGNOSIS — M79672 Pain in left foot: Secondary | ICD-10-CM

## 2015-06-18 DIAGNOSIS — M2012 Hallux valgus (acquired), left foot: Secondary | ICD-10-CM | POA: Diagnosis not present

## 2015-06-18 NOTE — Progress Notes (Signed)
   Subjective:    Patient ID: Allison Matthews, female    DOB: 25-Jun-1950, 65 y.o.   MRN: [id number]  HPI 65 year old female presents the office concerns of pain to the left foot for which she points the bunion site. She said that there is been swollen and painful since February. She did go to the emergency room in December she was told she had gout. She does feel that over the last couple weeks the pain has improved quite a bit although she still gets some swelling and pain to the first big toe joint on the left side. She denies any recent injury or trauma. No surrounding redness or red streaks. No tingling or numbness. She feels that the range of motion to her big toe joint has improved as she was not able to then the previously.  Review of Systems  Musculoskeletal: Positive for gait problem.       Objective:   Physical Exam General: AAO x3, NAD  Dermatological: Skin is warm, dry and supple bilateral. Nails x 10 are well manicured; remaining integument appears unremarkable at this time. There are no open sores, no preulcerative lesions, no rash or signs of infection present.  Vascular: Dorsalis Pedis artery and Posterior Tibial artery pedal pulses are 2/4 bilateral with immedate capillary fill time. Pedal hair growth present. No varicosities and no lower extremity edema present bilateral. There is no pain with calf compression, swelling, warmth, erythema.   Neruologic: Grossly intact via light touch bilateral. Vibratory intact via tuning fork bilateral. Protective threshold with Semmes Wienstein monofilament intact to all pedal sites bilateral. Patellar and Achilles deep tendon reflexes 2+ bilateral. No Babinski or clonus noted bilateral.   Musculoskeletal: There is a mild HAV present on the left side. There is localized edema to the left first MTPJ without any erythema or increase in warmth. There is no pain or crepitation with first MTPJ range of motion at this time. There is no open sore  identified. There is no warmth. His clinical signs of infection. No other areas of tenderness to bilateral lower extremity is. No pain vibratory sensation.  Gait: Unassisted, Nonantalgic.         Assessment & Plan:  65 year old female left first MTPJ capsulitis, HAV -Treatment options discussed including all alternatives, risks, and complications -X-rays were obtained and reviewed with the patient. No evidence of acute fracture stress fracture. -Etiology of symptoms were discussed -I discussed steroid injection to the area for which she were to proceed with today. Under sterile conditions a mixture of Kenalog 10 and 0.5% Marcaine plain was infiltrated around the first MTPJ on the left side without complications. She tolerated the injection well any complications. Postinjection care was discussed. -Offloading pads were dispensed. -Discussed shoe gear modifications. -Follow-up as scheduled or sooner if any problems arise. In the meantime, encouraged to call the office with any questions, concerns, change in symptoms.   Ovid CurdMatthew Wagoner, DPM

## 2015-07-16 ENCOUNTER — Telehealth: Payer: Self-pay | Admitting: Endocrinology

## 2015-07-16 ENCOUNTER — Encounter: Payer: Self-pay | Admitting: Podiatry

## 2015-07-16 ENCOUNTER — Ambulatory Visit (INDEPENDENT_AMBULATORY_CARE_PROVIDER_SITE_OTHER): Payer: BLUE CROSS/BLUE SHIELD | Admitting: Podiatry

## 2015-07-16 ENCOUNTER — Telehealth: Payer: Self-pay | Admitting: *Deleted

## 2015-07-16 VITALS — BP 117/68 | HR 79 | Resp 18

## 2015-07-16 DIAGNOSIS — M2012 Hallux valgus (acquired), left foot: Secondary | ICD-10-CM

## 2015-07-16 DIAGNOSIS — M779 Enthesopathy, unspecified: Secondary | ICD-10-CM | POA: Diagnosis not present

## 2015-07-16 MED ORDER — NONFORMULARY OR COMPOUNDED ITEM: Status: DC

## 2015-07-16 NOTE — Telephone Encounter (Signed)
I need to know what meter is covered before I can send anything in.  I've left a message for her to call back with that information.

## 2015-07-16 NOTE — Telephone Encounter (Signed)
Pt was told that the meter that she uses is no longer covered by insurance, she requests that a new meter and strips that her insurance will cover be sent into [facility] [address]

## 2015-07-16 NOTE — Telephone Encounter (Signed)
Dr. Wagoner ordered [facility] antiinflammatory cream. Faxed. 

## 2015-07-19 ENCOUNTER — Other Ambulatory Visit: Payer: Self-pay | Admitting: *Deleted

## 2015-07-19 MED ORDER — GLUCOSE BLOOD VI STRP: ORAL_STRIP | Status: DC

## 2015-07-19 MED ORDER — BAYER CONTOUR NEXT MONITOR W/DEVICE KIT: PACK | Status: DC

## 2015-07-19 MED ORDER — BAYER MICROLET LANCETS MISC: Status: AC

## 2015-07-19 NOTE — Telephone Encounter (Signed)
[facility] the contour next [facility]

## 2015-07-19 NOTE — Telephone Encounter (Signed)
rx has been sent 

## 2015-07-22 NOTE — Progress Notes (Signed)
Patient ID: Allison Matthews, female   DOB: 13-Apr-1950, 65 y.o.   MRN: [id number]  Subjective: 65 year old female presents the office today for follow-up evaluation of left first MTPJ capsulitis, HAV. She states that since last appointment she is significantly improved. She still has some discomfort at times however not any where near like it was last appointment. She has tried changing her shoes which seems to help. She also brought in her orthotics we will look at today. Denies any systemic complaints such as fevers, chills, nausea, vomiting. No acute changes since last appointment, and no other complaints at this time.   Objective: AAO x3, NAD DP/PT pulses palpable bilaterally, CRT less than 3 seconds Protective sensation intact with Simms Weinstein monofilament There is mild HAV present the left side. The edema on the first MTPJ has greatly improved although there is some trace edema still present. There is no pain or crepitation first MTPJ range of motion. No hypermobility is present. There is no overlying erythema or increase in warmth. No other areas of pinpoint bony tenderness or pain with vibratory sensation. MMT 5/5, ROM WNL. No edema, erythema, increase in warmth to bilateral lower extremities.  No open lesions or pre-ulcerative lesions.  No pain with calf compression, swelling, warmth, erythema  Assessment: Resolving left first MTPJ capsulitis; HAV  Plan: -All treatment options discussed with the patient including all alternatives, risks, complications.  -I discussed the second steroid injection however she wishes to hold off. -Upon vibration of her orthotics that do not appear to be fitting very well. I discussed new orthotics but she wishes to hold off on this.  -Discussed shoe gear changes. -Discussed surgical intervention. -Follow-up in 6 weeks if she is on any problems. Also discuss surgical intervention for that time if she desires. -Patient encouraged to call the office  with any questions, concerns, change in symptoms.   Ovid CurdMatthew Morley Gaumer, DPM

## 2015-08-06 ENCOUNTER — Encounter: Payer: Self-pay | Admitting: Podiatry

## 2015-08-06 ENCOUNTER — Ambulatory Visit (INDEPENDENT_AMBULATORY_CARE_PROVIDER_SITE_OTHER): Payer: BLUE CROSS/BLUE SHIELD | Admitting: Podiatry

## 2015-08-06 VITALS — BP 113/67 | HR 91 | Resp 14

## 2015-08-06 DIAGNOSIS — M7662 Achilles tendinitis, left leg: Secondary | ICD-10-CM

## 2015-08-06 DIAGNOSIS — M2012 Hallux valgus (acquired), left foot: Secondary | ICD-10-CM

## 2015-08-06 MED ORDER — NONFORMULARY OR COMPOUNDED ITEM: Status: DC

## 2015-08-06 NOTE — Patient Instructions (Signed)

## 2015-08-06 NOTE — Progress Notes (Signed)
Patient ID: Allison Matthews, female   DOB: [birth year], 65 y.o.   MRN: [id number]  Subjective: 65 year old female presents the office today for concerns of pain to the back of her left heel started last weekend. The pain has improved since the onset although she still gets some occasional discomfort and swelling. She is at the bunion is doing much better she's having no pain in the area. No recent injury trauma. No redness or warmth. No tingling or numbness. She's had this issue before to her left heel which she had a flareup of the "bone spur". Denies any systemic complaints such as fevers, chills, nausea, vomiting. No acute changes since last appointment, and no other complaints at this time.   Objective: AAO x3, NAD DP/PT pulses palpable bilaterally, CRT less than 3 seconds Protective sensation intact with Simms Weinstein monofilament Mild HAV is present on the left foot. There is no pain, swelling or redness around this area. She does have tenderness of the posterior aspect left heel on the insertion of the Achilles tendon into the posterior calcaneus. There is no pain with medial to lateral compression of the calcaneus. There is trace edema overlying this area. No erythema or increased warmth. Equinus is present. Constant has negative. MMT 5/5, ROM WNL. No edema, erythema, increase in warmth to bilateral lower extremities.  No open lesions or pre-ulcerative lesions.  No pain with calf compression, swelling, warmth, erythema  Assessment: Achilles tendinitis, retrocalcaneal exostosis left foot with resolved bunion pain   Plan: -All treatment options discussed with the patient including all alternatives, risks, complications.  -At this point recommend compound cream for anti-inflammatory. She did not get this for her bunion since she was doing better. Be ordered today. Continue stretching exercises which provided today. Night splint. He'll left. Ice. Follow-up in 3 weeks if symptoms continue or  sooner if any issues are to worsen.  -Patient encouraged to call the office with any questions, concerns, change in symptoms.   Ovid CurdMatthew Kiora Hallberg, DPM

## 2015-08-06 NOTE — Addendum Note (Signed)
Addended by: Alphia Kava'CONNELL, Malyk Girouard D on: 08/06/2015 04:15 PM   Modules accepted: Orders

## 2015-08-09 ENCOUNTER — Ambulatory Visit: Payer: BLUE CROSS/BLUE SHIELD | Admitting: Podiatry

## 2015-08-11 ENCOUNTER — Other Ambulatory Visit (INDEPENDENT_AMBULATORY_CARE_PROVIDER_SITE_OTHER): Payer: BLUE CROSS/BLUE SHIELD

## 2015-08-11 DIAGNOSIS — E1165 Type 2 diabetes mellitus with hyperglycemia: Secondary | ICD-10-CM | POA: Diagnosis not present

## 2015-08-11 LAB — COMPREHENSIVE METABOLIC PANEL
ALT: 24 U/L (ref 0–35)
AST: 26 U/L (ref 0–37)
Albumin: 4.4 g/dL (ref 3.5–5.2)
Alkaline Phosphatase: 96 U/L (ref 39–117)
BUN: 17 mg/dL (ref 6–23)
CO2: 30 mEq/L (ref 19–32)
Calcium: 10 mg/dL (ref 8.4–10.5)
Chloride: 100 mEq/L (ref 96–112)
Creatinine, Ser: 1.09 mg/dL (ref 0.40–1.20)
GFR: 64.72 mL/min (ref >60.00)
Glucose, Bld: 144 mg/dL — ABNORMAL HIGH (ref 70–99)
Potassium: 4.2 mEq/L (ref 3.5–5.1)
Sodium: 137 mEq/L (ref 135–145)
Total Bilirubin: 0.5 mg/dL (ref 0.2–1.2)
Total Protein: 7.7 g/dL (ref 6.0–8.3)

## 2015-08-11 LAB — MICROALBUMIN / CREATININE URINE RATIO
Creatinine,U: 170.3 mg/dL
Microalb Creat Ratio: 0.6 mg/g (ref 0.0–30.0)
Microalb, Ur: 1 mg/dL (ref 0.0–1.9)

## 2015-08-11 LAB — LIPID PANEL
Cholesterol: 220 mg/dL — ABNORMAL HIGH (ref 0–200)
HDL: 64.4 mg/dL (ref >39.00)
LDL Cholesterol: 138 mg/dL — ABNORMAL HIGH (ref 0–99)
NonHDL: 155.23
Total CHOL/HDL Ratio: 3
Triglycerides: 85 mg/dL (ref 0.0–149.0)
VLDL: 17 mg/dL (ref 0.0–40.0)

## 2015-08-11 LAB — HEMOGLOBIN A1C: Hgb A1c MFr Bld: 7.4 % — ABNORMAL HIGH (ref 4.6–6.5)

## 2015-08-12 ENCOUNTER — Other Ambulatory Visit: Payer: BLUE CROSS/BLUE SHIELD

## 2015-08-12 DIAGNOSIS — E785 Hyperlipidemia, unspecified: Secondary | ICD-10-CM | POA: Diagnosis not present

## 2015-08-12 DIAGNOSIS — E039 Hypothyroidism, unspecified: Secondary | ICD-10-CM | POA: Diagnosis not present

## 2015-08-12 DIAGNOSIS — M1000 Idiopathic gout, unspecified site: Secondary | ICD-10-CM | POA: Diagnosis not present

## 2015-08-12 DIAGNOSIS — M779 Enthesopathy, unspecified: Secondary | ICD-10-CM | POA: Diagnosis not present

## 2015-08-18 ENCOUNTER — Encounter: Payer: Self-pay | Admitting: Endocrinology

## 2015-08-18 ENCOUNTER — Ambulatory Visit (INDEPENDENT_AMBULATORY_CARE_PROVIDER_SITE_OTHER): Payer: BLUE CROSS/BLUE SHIELD | Admitting: Endocrinology

## 2015-08-18 VITALS — BP 120/76 | HR 76 | Temp 98.0°F | Resp 14 | Ht 65.0 in | Wt 166.4 lb

## 2015-08-18 DIAGNOSIS — E1165 Type 2 diabetes mellitus with hyperglycemia: Secondary | ICD-10-CM | POA: Diagnosis not present

## 2015-08-18 NOTE — Progress Notes (Signed)
Patient ID: Allison Matthews, female   DOB: 1951/01/29, 65 y.o.   MRN: [id number]   Reason for Appointment: Diabetes follow-up   History of Present Illness   Diagnosis: Type 2 DIABETES MELITUS, date of diagnosis:  2005    She was initially treated with insulin and subsequently was transitioned to [place] with excellent control Previously had upper normal A1c results and had been fairly compliant with diet and exercise Since early 2014 her A1c levels had been relatively higher Because of her higher A1c the Kombiglyze been increased to 2 tablets  in 03/2013  Recent history:   Oral hypoglycemic drugs: Kombiglyze XR 2.08/998, 1 tablet daily         For some time she is on Kombiglyze XR as monotherapy Her A1c previously was consistently under 7% and now it is 7.4%  She again says that she is taking only 1 tablet daily instead of 2 She thinks that if she takes 2 tablets in the morning she will feel weak about 2-3 hours after breakfast but did not report this previously  Her blood sugars at home are usually fairly good but she did not bring her monitor for download Has relatively higher fasting readings. She reports relatively better readings in the afternoon but not clear how often she checks after meals, only rarely will have a high readings with eating out She is checking readings after meals especially supper only occasionally  She has not been motivated to do regular exercise except going up stairs at work Was having foot pain recently which is now improving She thinks she is generally compliant with her diet  No change in weight   Side effects from medications: None  Monitors blood glucose:  every other day .    Glucometer:  Accu-Chek      Blood Glucose readings from recall  Mean values apply above for all meters except median for One Touch  PRE-MEAL Fasting Lunch Dinner Bedtime Overall  Glucose range: 125-140  130 -193   Mean/median:        Hypoglycemia  frequency: ?  She feels a little week if she takes 2 tablets of Kombiglyze XR midmorning .           Meals: 3 meals per day. Trying to watch her diet most of the time. Oatmeal in am without protein      Physical activity: exercise: None             Dietician visit: Most recent: 2009             Wt Readings from Last 3 Encounters:  08/18/15 166 lb 6.4 oz (75.479 kg)  05/20/15 166 lb (75.297 kg)  04/04/15 167 lb (75.751 kg)   Diabetes Labs:  Lab Results  Component Value Date   HGBA1C 7.4* 08/11/2015   HGBA1C 7.2* 05/17/2015   HGBA1C 6.9* 02/15/2015   Lab Results  Component Value Date   MICROALBUR 1.0 08/11/2015   LDLCALC 138* 08/11/2015   CREATININE 1.09 08/11/2015       Medication List       This list is accurate as of: 08/18/15  9:39 PM.  Always use your most recent med list.               BAYER CONTOUR NEXT MONITOR w/Device Kit  Use to check blood sugar 2 times per day dx code E11.65     BAYER MICROLET LANCETS lancets  Use as instructed to check blood sugar 2 times  per day dx code E11.65     colchicine 0.6 MG tablet  Reported on 08/18/2015     fluticasone 0.05 % cream  Commonly known as:  CUTIVATE  Reported on 08/18/2015     glucose blood test strip  Commonly known as:  BAYER CONTOUR NEXT TEST  Use as instructed to check blood sugar 2 times per day dx codeE11.65     HYDROcodone-acetaminophen 7.5-325 MG tablet  Commonly known as:  NORCO  Reported on 08/18/2015     HYDROcodone-acetaminophen 5-325 MG tablet  Commonly known as:  NORCO  Take 1 tablet by mouth every 6 (six) hours as needed for severe pain.     KOMBIGLYZE XR 2.08-998 MG Tb24  Generic drug:  Saxagliptin-Metformin  TAKE 2 TABLETS DAILY     levothyroxine 50 MCG tablet  Commonly known as:  SYNTHROID, LEVOTHROID  50 mcg.     NONFORMULARY OR COMPOUNDED [place] compound:  Antiinflammatory cream - Diclofenac 3%, Baclofen 2%, Cyclobenzaprine 2%, Lidocaine 2%, dispense 120gram,  apply 1-2 grams to affected area 3-4 times daily, +2refills.     NONFORMULARY OR COMPOUNDED ITEM  [employer] compound:  Combo Pain #1 - Baclofen 3%, Gabapentin *%, Aripiprazole 0.5%, Ketoralac 8%, Lidocaine 5%, dispense 240 grams, appy 1-2 pumps to focal area 3-4 times daily, +3refills.     simvastatin 40 MG tablet  Commonly known as:  ZOCOR  TAKE 1 TABLET AT BEDTIME     valsartan-hydrochlorothiazide 160-25 MG tablet  Commonly known as:  DIOVAN-HCT        Allergies: No Known Allergies  No past medical history on file.  Past Surgical History  Procedure Laterality Date  . Abdominal hysterectomy      Family History  Problem Relation Age of Onset  . Diabetes Mother     Social History:  reports that she has never smoked. She has never used smokeless tobacco. Her alcohol and drug histories are not on file.  Review of Systems:  She has had diabetic retinopathy treated with laser and follows with Dr. Zadie Rhine ,  stable now  HYPERTENSION:  she has had fairly good control of her blood pressure with Diovan HCT, half tablet dailyWhich she takes in the morning  History of mild renal insufficiency: Creatinine is consistently normal   Lab Results  Component Value Date   CREATININE 1.09 08/11/2015    HYPERLIPIDEMIA: The lipid abnormality consists of elevated LDL. She is irregular with medications Even though she was told to take medication in the morning she does not think about it half the time LDL is again high   Lab Results  Component Value Date   CHOL 220* 08/11/2015   HDL 64.40 08/11/2015   LDLCALC 138* 08/11/2015   LDLDIRECT 131.2 02/06/2014   TRIG 85.0 08/11/2015   CHOLHDL 3 08/11/2015    Mild hypothyroidism: Adequately controlled with 50 g levothyroxine, Also followed by PCP  Lab Results  Component Value Date   FREET4 0.89 10/09/2013   TSH 0.85 02/15/2015   TSH 1.60 02/06/2014   TSH 0.44 10/09/2013   Diabetic foot exam in 11/16 showed normal monofilament  sensation in the toes and plantar surfaces, no skin lesions or ulcers on the feet and normal pedal pulses      Examination:   BP 120/76 mmHg  Pulse 76  Temp(Src) 98 F (36.7 C)  Resp 14  Ht _0  (1.651 m)  Wt 166 lb 6.4 oz (75.479 kg)  BMI 27.69 kg/m2  SpO2 97%  Body  mass index is 27.69 kg/(m^2).      ASSESSMENT/ PLAN:   Diabetes type 2   The patient's diabetes control appears to be somewhat worse with A1c now 7.4 and higher than usual This is likely to be from her taking only half of the prescribed dose of Kombiglyze XR. She thinks that she feels weak with taking the full dose of the medication and this is likely to be from her taking the medication with only a small carbohydrate breakfast in the morning She can exercise more regularly also to help her control and some weight loss  She was recommended that she take her Kombiglyze XR at lunchtime, both tablets together when she is eating her main meal If she is having any symptoms of weakness with this will split her Onglyza and metformin ER separately with lower doses of metformin per day  Hyperlipidemia:  LDL much higher with her again been noncompliant with her medication Given her the option of taking day simvastatin either at breakfast or lunch for consistent compliance  Blood pressure controlled with  Diovan HCT    Mamye Bolds 08/18/2015, 9:39 PM   No visits with results within 1 Week(s) from this visit. Latest known visit with results is:  Lab on 08/11/2015  Component Date Value Ref Range Status  . Hgb A1c MFr Bld 08/11/2015 7.4* 4.6 - 6.5 % Final   Glycemic Control Guidelines for People with Diabetes:Non Diabetic:  <6%Goal of Therapy: <7%Additional Action Suggested:  >8%   . Sodium 08/11/2015 137  135 - 145 mEq/L Final  . Potassium 08/11/2015 4.2  3.5 - 5.1 mEq/L Final  . Chloride 08/11/2015 100  96 - 112 mEq/L Final  . CO2 08/11/2015 30  19 - 32 mEq/L Final  . Glucose, Bld 08/11/2015 144* 70 - 99 mg/dL  Final  . BUN 08/11/2015 17  6 - 23 mg/dL Final  . Creatinine, Ser 08/11/2015 1.09  0.40 - 1.20 mg/dL Final  . Total Bilirubin 08/11/2015 0.5  0.2 - 1.2 mg/dL Final  . Alkaline Phosphatase 08/11/2015 96  39 - 117 U/L Final  . AST 08/11/2015 26  0 - 37 U/L Final  . ALT 08/11/2015 24  0 - 35 U/L Final  . Total Protein 08/11/2015 7.7  6.0 - 8.3 g/dL Final  . Albumin 08/11/2015 4.4  3.5 - 5.2 g/dL Final  . Calcium 08/11/2015 10.0  8.4 - 10.5 mg/dL Final  . GFR 08/11/2015 64.72  >60.00 mL/min Final  . Cholesterol 08/11/2015 220* 0 - 200 mg/dL Final   ATP III Classification       Desirable:  < 200 mg/dL               Borderline High:  200 - 239 mg/dL          High:  > = 240 mg/dL  . Triglycerides 08/11/2015 85.0  0.0 - 149.0 mg/dL Final   Normal:  <150 mg/dLBorderline High:  150 - 199 mg/dL  . HDL 08/11/2015 64.40  >39.00 mg/dL Final  . VLDL 08/11/2015 17.0  0.0 - 40.0 mg/dL Final  . LDL Cholesterol 08/11/2015 138* 0 - 99 mg/dL Final  . Total CHOL/HDL Ratio 08/11/2015 3   Final                  Men          Women1/2 Average Risk     3.4          3.3Average Risk  5.0          4.42X Average Risk          9.6          7.13X Average Risk          15.0          11.0                      . NonHDL 08/11/2015 155.23   Final   NOTE:  Non-HDL goal should be 30 mg/dL higher than patient's LDL goal (i.e. LDL goal of < 70 mg/dL, would have non-HDL goal of < 100 mg/dL)  . Microalb, Ur 08/11/2015 1.0  0.0 - 1.9 mg/dL Final  . Creatinine,U 08/11/2015 170.3   Final  . Microalb Creat Ratio 08/11/2015 0.6  0.0 - 30.0 mg/g Final

## 2015-08-18 NOTE — Patient Instructions (Signed)
Take 2 Kombiglyze with lunch  Simvastin at lunch daily  Check blood sugars on waking up 3  times a week Also check blood sugars about 2 hours after a meal and do this after different meals by rotation  Recommended blood sugar levels on waking up is 90-130 and about 2 hours after meal is 130-160  Please bring your blood sugar monitor to each visit, thank you

## 2015-08-27 ENCOUNTER — Ambulatory Visit: Payer: BLUE CROSS/BLUE SHIELD | Admitting: Podiatry

## 2015-08-29 ENCOUNTER — Other Ambulatory Visit: Payer: Self-pay | Admitting: Endocrinology

## 2015-08-30 ENCOUNTER — Other Ambulatory Visit: Payer: Self-pay | Admitting: Family Medicine

## 2015-08-30 ENCOUNTER — Ambulatory Visit
Admission: RE | Admit: 2015-08-30 | Discharge: 2015-08-30 | Disposition: A | Discharge status: Home / Self Care | Payer: BLUE CROSS/BLUE SHIELD | Source: Ambulatory Visit | Attending: Family Medicine | Admitting: Family Medicine

## 2015-08-30 DIAGNOSIS — M25562 Pain in left knee: Secondary | ICD-10-CM | POA: Diagnosis not present

## 2015-08-30 DIAGNOSIS — W57XXXA Bitten or stung by nonvenomous insect and other nonvenomous arthropods, initial encounter: Secondary | ICD-10-CM

## 2015-08-30 DIAGNOSIS — Z6828 Body mass index (BMI) 28.0-28.9, adult: Secondary | ICD-10-CM | POA: Diagnosis not present

## 2015-08-30 DIAGNOSIS — S80262A Insect bite (nonvenomous), left knee, initial encounter: Secondary | ICD-10-CM | POA: Diagnosis not present

## 2015-09-04 DIAGNOSIS — M223X2 Other derangements of patella, left knee: Secondary | ICD-10-CM | POA: Diagnosis not present

## 2015-09-08 DIAGNOSIS — M25562 Pain in left knee: Secondary | ICD-10-CM | POA: Diagnosis not present

## 2015-11-18 ENCOUNTER — Other Ambulatory Visit (INDEPENDENT_AMBULATORY_CARE_PROVIDER_SITE_OTHER): Payer: BLUE CROSS/BLUE SHIELD

## 2015-11-18 DIAGNOSIS — E1165 Type 2 diabetes mellitus with hyperglycemia: Secondary | ICD-10-CM | POA: Diagnosis not present

## 2015-11-18 LAB — BASIC METABOLIC PANEL
BUN: 16 mg/dL (ref 6–23)
CO2: 29 mEq/L (ref 19–32)
Calcium: 9.9 mg/dL (ref 8.4–10.5)
Chloride: 101 mEq/L (ref 96–112)
Creatinine, Ser: 1.22 mg/dL — ABNORMAL HIGH (ref 0.40–1.20)
GFR: 56.78 mL/min — ABNORMAL LOW (ref >60.00)
Glucose, Bld: 124 mg/dL — ABNORMAL HIGH (ref 70–99)
Potassium: 4.2 mEq/L (ref 3.5–5.1)
Sodium: 139 mEq/L (ref 135–145)

## 2015-11-18 LAB — LIPID PANEL
Cholesterol: 211 mg/dL — ABNORMAL HIGH (ref 0–200)
HDL: 58.3 mg/dL (ref >39.00)
LDL Cholesterol: 129 mg/dL — ABNORMAL HIGH (ref 0–99)
NonHDL: 152.61
Total CHOL/HDL Ratio: 4
Triglycerides: 117 mg/dL (ref 0.0–149.0)
VLDL: 23.4 mg/dL (ref 0.0–40.0)

## 2015-11-18 LAB — HEMOGLOBIN A1C: Hgb A1c MFr Bld: 7.2 % — ABNORMAL HIGH (ref 4.6–6.5)

## 2015-11-22 ENCOUNTER — Ambulatory Visit (INDEPENDENT_AMBULATORY_CARE_PROVIDER_SITE_OTHER): Payer: BLUE CROSS/BLUE SHIELD | Admitting: Endocrinology

## 2015-11-22 ENCOUNTER — Encounter: Payer: Self-pay | Admitting: Endocrinology

## 2015-11-22 VITALS — BP 122/76 | HR 67 | Ht 65.0 in | Wt 169.0 lb

## 2015-11-22 DIAGNOSIS — E038 Other specified hypothyroidism: Secondary | ICD-10-CM

## 2015-11-22 DIAGNOSIS — E063 Autoimmune thyroiditis: Secondary | ICD-10-CM

## 2015-11-22 DIAGNOSIS — E7800 Pure hypercholesterolemia, unspecified: Secondary | ICD-10-CM

## 2015-11-22 DIAGNOSIS — E1165 Type 2 diabetes mellitus with hyperglycemia: Secondary | ICD-10-CM

## 2015-11-22 DIAGNOSIS — I10 Essential (primary) hypertension: Secondary | ICD-10-CM | POA: Diagnosis not present

## 2015-11-22 NOTE — Patient Instructions (Signed)
Check blood sugars on waking up    Also check blood sugars about 2 hours after a meal and do this after different meals by rotation  Recommended blood sugar levels on waking up is 90-130 and about 2 hours after meal is 130-160  Please bring your blood sugar monitor to each visit, thank you  Take both Janum[place] at lunch  Better diet

## 2015-11-22 NOTE — Progress Notes (Signed)
Patient ID: Allison Matthews, female   DOB: Mar 28, 1951, 65 y.o.   MRN: [id number]   Reason for Appointment:  follow-up   History of Present Illness   Diagnosis: Type 2 DIABETES MELITUS, date of diagnosis:  2005    She was initially treated with insulin and subsequently was transitioned to [place] with excellent control Previously had upper normal A1c results and had been fairly compliant with diet and exercise Since early 2014 her A1c levels had been relatively higher Because of her higher A1c the Kombiglyze been increased to 2 tablets  in 03/2013  Recent history:   Oral hypoglycemic drugs: Kombiglyze XR 2.08/998, 1 tablet daily         For some time she is on Kombiglyze XR as monotherapy Her A1c previously was consistently under 7% and now it is 7.4%  She again says that she is taking only 1 tablet daily instead of 2 despite asking her to take 2 tablets together at lunchtime She thinks that if she takes 2 tablets in the morning she will feel weak about 2-3 hours after breakfast  She did not have any difficulties taking both tablets at lunch and cannot explain 5 she takes only 1 tablet  Has relatively higher fasting readings but has checked very infrequently. She is checking readings after meals rarely also  She has not been motivated to do regular exercise except going up stairs at work She thinks she is generally not able to be compliant with her diet  She has gained a little weight    Side effects from medications: None  Monitors blood glucose:  every other day .    Glucometer:  Accu-Chek      Blood Glucose readings from recall   Mean values apply above for all meters except median for One Touch  PRE-MEAL Fasting Lunch Dinner Bedtime Overall  Glucose range: 139, 148  128  116     Mean/median:        Hypoglycemia frequency:  none        Meals: 3 meals per day. Leonel Ramsay in am without protein or nothing     Physical activity: exercise: walking  steps            Dietician visit: Most recent: 2009             Wt Readings from Last 3 Encounters:  11/22/15 169 lb (76.7 kg)  08/18/15 166 lb 6.4 oz (75.5 kg)  05/20/15 166 lb (75.3 kg)   Diabetes Labs:  Lab Results  Component Value Date   HGBA1C 7.2 (H) 11/18/2015   HGBA1C 7.4 (H) 08/11/2015   HGBA1C 7.2 (H) 05/17/2015   Lab Results  Component Value Date   MICROALBUR 1.0 08/11/2015   LDLCALC 129 (H) 11/18/2015   CREATININE 1.22 (H) 11/18/2015       Medication List       Accurate as of 11/22/15  4:47 PM. Always use your most recent med list.          BAYER CONTOUR NEXT MONITOR w/Device Kit Use to check blood sugar 2 times per day dx code E11.65   BAYER MICROLET LANCETS lancets Use as instructed to check blood sugar 2 times per day dx code E11.65   colchicine 0.6 MG tablet Reported on 08/18/2015   fluticasone 0.05 % cream Commonly known as:  CUTIVATE Reported on 08/18/2015   glucose blood test strip Commonly known as:  BAYER CONTOUR NEXT TEST Use as instructed to check  blood sugar 2 times per day dx codeE11.65   HYDROcodone-acetaminophen 7.5-325 MG tablet Commonly known as:  NORCO Reported on 08/18/2015   HYDROcodone-acetaminophen 5-325 MG tablet Commonly known as:  NORCO Take 1 tablet by mouth every 6 (six) hours as needed for severe pain.   KOMBIGLYZE XR 2.08-998 MG Tb24 Generic drug:  Saxagliptin-Metformin TAKE 2 TABLETS BY MOUTH EVERY DAY   levothyroxine 50 MCG tablet Commonly known as:  SYNTHROID, LEVOTHROID 50 mcg.   NONFORMULARY OR COMPOUNDED [place] compound:  Antiinflammatory cream - Diclofenac 3%, Baclofen 2%, Cyclobenzaprine 2%, Lidocaine 2%, dispense 120gram, apply 1-2 grams to affected area 3-4 times daily, +2refills.   NONFORMULARY OR COMPOUNDED ITEM [employer] compound:  Combo Pain #1 - Baclofen 3%, Gabapentin *%, Aripiprazole 0.5%, Ketoralac 8%, Lidocaine 5%, dispense 240 grams, appy 1-2 pumps to focal area 3-4 times  daily, +3refills.   simvastatin 40 MG tablet Commonly known as:  ZOCOR TAKE 1 TABLET AT BEDTIME   valsartan-hydrochlorothiazide 160-25 MG tablet Commonly known as:  DIOVAN-HCT       Allergies: No Known Allergies  No past medical history on file.  Past Surgical History:  Procedure Laterality Date  . ABDOMINAL HYSTERECTOMY      Family History  Problem Relation Age of Onset  . Diabetes Mother     Social History:  reports that she has never smoked. She has never used smokeless tobacco. Her alcohol and drug histories are not on file.  Review of Systems:  She has had diabetic retinopathy treated with laser and follows with Dr. Zadie Rhine ,  stable   HYPERTENSION:  she has had fairly good control of her blood pressure with Diovan HCT, half tablet daily Which she takes in the morning  History of mild renal insufficiency: Creatinine is consistentlyUpper normal   Lab Results  Component Value Date   CREATININE 1.22 (H) 11/18/2015    HYPERLIPIDEMIA: The lipid abnormality consists of elevated LDL. She is irregular with medications Even though she was told to take medication in the morning she does not Take it regularly again for no particular reason even though she understands that her lipids are not controlled LDL is again high   Lab Results  Component Value Date   CHOL 211 (H) 11/18/2015   HDL 58.30 11/18/2015   LDLCALC 129 (H) 11/18/2015   LDLDIRECT 131.2 02/06/2014   TRIG 117.0 11/18/2015   CHOLHDL 4 11/18/2015    Mild hypothyroidism: Adequately controlled with 50 g levothyroxine,  followed by PCP  Lab Results  Component Value Date   FREET4 0.89 10/09/2013   TSH 0.85 02/15/2015   TSH 1.60 02/06/2014   TSH 0.44 10/09/2013   Diabetic foot exam in 11/16 showed normal monofilament sensation in the toes and plantar surfaces, no skin lesions or ulcers on the feet and normal pedal pulses      Examination:   BP 122/76   Pulse 67   Ht '5\' 5"'$  (1.651 m)   Wt 169 lb  (76.7 kg)   SpO2 98%   BMI 28.12 kg/m   Body mass index is 28.12 kg/m.     ASSESSMENT/ PLAN:   Diabetes type 2  See history of present illness for detailed discussion of current diabetes management, blood sugar patterns and problems identified  She is not motivated at all to follow instructions for taking her medication, starting regular exercise program or watch her diet consistently as well as checking blood sugars only rarely at home A1c is still over 7%  Although  she can tolerate taking the Kombiglyze full dose at lunchtime instead of breakfast she has not done so and encouraged her to start doing it regularly Advised her to start walking the evenings for exercise She'll try to be more consistent with diet and glucose monitoring   Hyperlipidemia:  LDL  higher than target with her again been noncompliant with her medication She is still not remembering to take it daily even though she takes her blood pressure medication consistently in the morning  Blood pressure controlled with  Diovan HCT low-dose  Thyroid: Will recheck TSH on next visit    Sherrie Marsan 11/22/2015, 4:47 PM   Lab on 11/18/2015  Component Date Value Ref Range Status  . Hgb A1c MFr Bld 11/18/2015 7.2* 4.6 - 6.5 % Final  . Sodium 11/18/2015 139  135 - 145 mEq/L Final  . Potassium 11/18/2015 4.2  3.5 - 5.1 mEq/L Final  . Chloride 11/18/2015 101  96 - 112 mEq/L Final  . CO2 11/18/2015 29  19 - 32 mEq/L Final  . Glucose, Bld 11/18/2015 124* 70 - 99 mg/dL Final  . BUN 11/18/2015 16  6 - 23 mg/dL Final  . Creatinine, Ser 11/18/2015 1.22* 0.40 - 1.20 mg/dL Final  . Calcium 11/18/2015 9.9  8.4 - 10.5 mg/dL Final  . GFR 11/18/2015 56.78* >60.00 mL/min Final  . Cholesterol 11/18/2015 211* 0 - 200 mg/dL Final  . Triglycerides 11/18/2015 117.0  0.0 - 149.0 mg/dL Final  . HDL 11/18/2015 58.30  >39.00 mg/dL Final  . VLDL 11/18/2015 23.4  0.0 - 40.0 mg/dL Final  . LDL Cholesterol 11/18/2015 129* 0 - 99 mg/dL Final   . Total CHOL/HDL Ratio 11/18/2015 4   Final  . NonHDL 11/18/2015 152.61   Final

## 2015-11-29 DIAGNOSIS — Z6829 Body mass index (BMI) 29.0-29.9, adult: Secondary | ICD-10-CM | POA: Diagnosis not present

## 2015-11-29 DIAGNOSIS — Z01419 Encounter for gynecological examination (general) (routine) without abnormal findings: Secondary | ICD-10-CM | POA: Diagnosis not present

## 2015-11-29 DIAGNOSIS — Z1231 Encounter for screening mammogram for malignant neoplasm of breast: Secondary | ICD-10-CM | POA: Diagnosis not present

## 2015-11-29 DIAGNOSIS — Z13820 Encounter for screening for osteoporosis: Secondary | ICD-10-CM | POA: Diagnosis not present

## 2015-12-06 DIAGNOSIS — E08311 Diabetes mellitus due to underlying condition with unspecified diabetic retinopathy with macular edema: Secondary | ICD-10-CM | POA: Diagnosis not present

## 2015-12-06 DIAGNOSIS — M1000 Idiopathic gout, unspecified site: Secondary | ICD-10-CM | POA: Diagnosis not present

## 2015-12-06 DIAGNOSIS — E039 Hypothyroidism, unspecified: Secondary | ICD-10-CM | POA: Diagnosis not present

## 2015-12-06 DIAGNOSIS — E785 Hyperlipidemia, unspecified: Secondary | ICD-10-CM | POA: Diagnosis not present

## 2016-02-18 ENCOUNTER — Other Ambulatory Visit (INDEPENDENT_AMBULATORY_CARE_PROVIDER_SITE_OTHER): Payer: BLUE CROSS/BLUE SHIELD

## 2016-02-18 DIAGNOSIS — E1165 Type 2 diabetes mellitus with hyperglycemia: Secondary | ICD-10-CM

## 2016-02-18 LAB — TSH: TSH: 2.12 u[IU]/mL (ref 0.35–4.50)

## 2016-02-18 LAB — BASIC METABOLIC PANEL
BUN: 15 mg/dL (ref 6–23)
CO2: 28 mEq/L (ref 19–32)
Calcium: 10.2 mg/dL (ref 8.4–10.5)
Chloride: 100 mEq/L (ref 96–112)
Creatinine, Ser: 1.22 mg/dL — ABNORMAL HIGH (ref 0.40–1.20)
GFR: 56.73 mL/min — ABNORMAL LOW (ref >60.00)
Glucose, Bld: 107 mg/dL — ABNORMAL HIGH (ref 70–99)
Potassium: 4.1 mEq/L (ref 3.5–5.1)
Sodium: 138 mEq/L (ref 135–145)

## 2016-02-18 LAB — LDL CHOLESTEROL, DIRECT: Direct LDL: 144 mg/dL

## 2016-02-18 LAB — HEMOGLOBIN A1C: Hgb A1c MFr Bld: 7.4 % — ABNORMAL HIGH (ref 4.6–6.5)

## 2016-02-22 ENCOUNTER — Ambulatory Visit: Payer: BLUE CROSS/BLUE SHIELD | Admitting: Endocrinology

## 2016-02-23 ENCOUNTER — Encounter: Payer: Self-pay | Admitting: Endocrinology

## 2016-02-23 ENCOUNTER — Ambulatory Visit (INDEPENDENT_AMBULATORY_CARE_PROVIDER_SITE_OTHER): Payer: BLUE CROSS/BLUE SHIELD | Admitting: Endocrinology

## 2016-02-23 VITALS — BP 128/80 | HR 73 | Ht 65.0 in | Wt 169.0 lb

## 2016-02-23 DIAGNOSIS — E1165 Type 2 diabetes mellitus with hyperglycemia: Secondary | ICD-10-CM

## 2016-02-23 DIAGNOSIS — E038 Other specified hypothyroidism: Secondary | ICD-10-CM

## 2016-02-23 DIAGNOSIS — E7800 Pure hypercholesterolemia, unspecified: Secondary | ICD-10-CM

## 2016-02-23 DIAGNOSIS — I10 Essential (primary) hypertension: Secondary | ICD-10-CM

## 2016-02-23 DIAGNOSIS — E063 Autoimmune thyroiditis: Secondary | ICD-10-CM

## 2016-02-23 MED ORDER — ROSUVASTATIN CALCIUM 20 MG PO TABS: 20.0000 mg | ORAL_TABLET | Freq: Every day | ORAL | 3 refills | Status: DC

## 2016-02-23 NOTE — Progress Notes (Signed)
Patient ID: Allison Matthews, female   DOB: 06-05-1950, 65 y.o.   MRN: [id number]   Reason for Appointment:  follow-up   History of Present Illness   Diagnosis: Type 2 DIABETES MELITUS, date of diagnosis:  2005    She was initially treated with insulin and subsequently was transitioned to [place] with excellent control Previously had upper normal A1c results and had been fairly compliant with diet and exercise Since early 2014 her A1c levels had been relatively higher Because of her higher A1c the Kombiglyze been increased to 2 tablets  in 03/2013  Recent history:   Oral hypoglycemic drugs: Kombiglyze XR 2.08/998, 1 tablet daily         For some time she is on Kombiglyze XR as monotherapy Her A1c previously was consistently under 7% and now it is 7.4%  She again says that she is taking only 1 tablet daily instead of 2 despite asking her to take 2 tablets together at lunchtime on  her last 2 visits She thinks that if she takes 2 tablets in the morning she will feel weak about 2-3 hours after breakfast  She did not bring her monitor for download Has relatively higher fasting readings at times Occasionally blood sugars maybe 160 after lunch which is her main meal but has checked very infrequently.  She again has not been motivated to do regular exercise except going up stairs at work She thinks she is getting more snacks and not consistent with diet and has not lost any weight   Side effects from medications: None  Monitors blood glucose:  every other day .    Glucometer:  Accu-Chek      Blood Glucose readings from recall  Mean values apply above for all meters except median for One Touch  PRE-MEAL Fasting Lunch Dinner Bedtime Overall  Glucose range: 110-160 128  160    Mean/median:        Hypoglycemia frequency:  none        Meals: 3 meals per day. Oatmeal in am without protein or nothing     Physical activity: exercise: walking steps            Dietician  visit: Most recent: 2009             Wt Readings from Last 3 Encounters:  02/23/16 169 lb (76.7 kg)  11/22/15 169 lb (76.7 kg)  08/18/15 166 lb 6.4 oz (75.5 kg)   Diabetes Labs:  Lab Results  Component Value Date   HGBA1C 7.4 (H) 02/18/2016   HGBA1C 7.2 (H) 11/18/2015   HGBA1C 7.4 (H) 08/11/2015   Lab Results  Component Value Date   MICROALBUR 1.0 08/11/2015   LDLCALC 129 (H) 11/18/2015   CREATININE 1.22 (H) 02/18/2016    Other problems: See review of systems    Medication List       Accurate as of 02/23/16  4:12 PM. Always use your most recent med list.          BAYER CONTOUR NEXT MONITOR w/Device Kit Use to check blood sugar 2 times per day dx code E11.65   BAYER MICROLET LANCETS lancets Use as instructed to check blood sugar 2 times per day dx code E11.65   colchicine 0.6 MG tablet Reported on 08/18/2015   fluticasone 0.05 % cream Commonly known as:  CUTIVATE Reported on 08/18/2015   glucose blood test strip Commonly known as:  BAYER CONTOUR NEXT TEST Use as instructed to check blood sugar  2 times per day dx codeE11.65   HYDROcodone-acetaminophen 7.5-325 MG tablet Commonly known as:  NORCO Reported on 08/18/2015   HYDROcodone-acetaminophen 5-325 MG tablet Commonly known as:  NORCO Take 1 tablet by mouth every 6 (six) hours as needed for severe pain.   KOMBIGLYZE XR 2.08-998 MG Tb24 Generic drug:  Saxagliptin-Metformin TAKE 2 TABLETS BY MOUTH EVERY DAY   levothyroxine 50 MCG tablet Commonly known as:  SYNTHROID, LEVOTHROID 50 mcg.   NONFORMULARY OR COMPOUNDED [place] compound:  Antiinflammatory cream - Diclofenac 3%, Baclofen 2%, Cyclobenzaprine 2%, Lidocaine 2%, dispense 120gram, apply 1-2 grams to affected area 3-4 times daily, +2refills.   NONFORMULARY OR COMPOUNDED ITEM [employer] compound:  Combo Pain #1 - Baclofen 3%, Gabapentin *%, Aripiprazole 0.5%, Ketoralac 8%, Lidocaine 5%, dispense 240 grams, appy 1-2 pumps to focal area  3-4 times daily, +3refills.   simvastatin 40 MG tablet Commonly known as:  ZOCOR TAKE 1 TABLET AT BEDTIME   valsartan-hydrochlorothiazide 160-25 MG tablet Commonly known as:  DIOVAN-HCT       Allergies: No Known Allergies  No past medical history on file.  Past Surgical History:  Procedure Laterality Date  . ABDOMINAL HYSTERECTOMY      Family History  Problem Relation Age of Onset  . Diabetes Mother     Social History:  reports that she has never smoked. She has never used smokeless tobacco. Her alcohol and drug histories are not on file.  Review of Systems:  She has had diabetic retinopathy treated with laser and follows with Dr. Zadie Rhine ,  stable   HYPERTENSION:  she has had fairly good control of her blood pressure with Diovan HCT, half tablet daily  History of mild renal insufficiency: Creatinine is consistently Upper normal   Lab Results  Component Value Date   CREATININE 1.22 (H) 02/18/2016    HYPERLIPIDEMIA: The lipid abnormality consists of elevated LDL. She is irregular with Her Zocor again and recurrently will forget it on the weekends and possibly other days She tries to take this in the morning but will not forget her blood pressure medication in the morning LDL is again significantly high    Lab Results  Component Value Date   CHOL 211 (H) 11/18/2015   HDL 58.30 11/18/2015   LDLCALC 129 (H) 11/18/2015   LDLDIRECT 144.0 02/18/2016   TRIG 117.0 11/18/2015   CHOLHDL 4 11/18/2015    Mild hypothyroidism: Adequately controlled with 50 g levothyroxine,  followed by PCP  Lab Results  Component Value Date   FREET4 0.89 10/09/2013   TSH 2.12 02/18/2016   TSH 0.85 02/15/2015   TSH 1.60 02/06/2014   Diabetic foot exam in 11/16 showed normal monofilament sensation in the toes and plantar surfaces, no skin lesions or ulcers on the feet and normal pedal pulses      Examination:   BP 128/80   Pulse 73   Ht '5\' 5"'  (1.651 m)   Wt 169 lb (76.7 kg)    SpO2 94%   BMI 28.12 kg/m   Body mass index is 28.12 kg/m.     ASSESSMENT/ PLAN:   Diabetes type 2  See history of present illness for detailed discussion of current diabetes management, blood sugar patterns and problems identified  A1c is still over 7% She continues to take only 1 tablet of Kombiglyze XR instead of 2 She refuses to consider a GLP-1 injection is may help her with weight loss and better long-term control She does not do any regular walking  Did not bring her monitor for download, probably not checking blood sugars enough especially after meals and this was discussed She can cut back on snacks and lose weight  For now she wants to try to take both her Kombiglyze XR tablets at lunchtime instead of changing her medication Discussed A1c target of under 7   Hyperlipidemia:  LDL  higher than target with her again been noncompliant with her medication She is still not remembering to take it daily even though she takes her blood pressure medication consistently in the morning Discussed that she can try taking the Zocor at lunchtime for better compliance with her diabetes medication May consider Crestor if LDL not improved Discussed low saturated fat diet and LDL targets  Blood pressure controlled with  Diovan HCT low-dose  Thyroid: TSH normal    Perri Lamagna 02/23/2016, 4:12 PM   Lab on 02/18/2016  Component Date Value Ref Range Status  . Hgb A1c MFr Bld 02/18/2016 7.4* 4.6 - 6.5 % Final  . Sodium 02/18/2016 138  135 - 145 mEq/L Final  . Potassium 02/18/2016 4.1  3.5 - 5.1 mEq/L Final  . Chloride 02/18/2016 100  96 - 112 mEq/L Final  . CO2 02/18/2016 28  19 - 32 mEq/L Final  . Glucose, Bld 02/18/2016 107* 70 - 99 mg/dL Final  . BUN 02/18/2016 15  6 - 23 mg/dL Final  . Creatinine, Ser 02/18/2016 1.22* 0.40 - 1.20 mg/dL Final  . Calcium 02/18/2016 10.2  8.4 - 10.5 mg/dL Final  . GFR 02/18/2016 56.73* >60.00 mL/min Final  . Direct LDL 02/18/2016 144.0  mg/dL  Final  . TSH 02/18/2016 2.12  0.35 - 4.50 uIU/mL Final    Counseling time on subjects discussed above is over 50% of today's 25 minute visit

## 2016-02-23 NOTE — Patient Instructions (Signed)
Take 2 Kombiglyze at lunch   Take all Meds at lunch daily  Check blood sugars on waking up 2-3x per week  Also check blood sugars about 2 hours after a meal and do this after different meals by rotation  Recommended blood sugar levels on waking up is 90-130 and about 2 hours after meal is 130-160  Please bring your blood sugar monitor to each visit, thank you

## 2016-03-23 DIAGNOSIS — E119 Type 2 diabetes mellitus without complications: Secondary | ICD-10-CM | POA: Diagnosis not present

## 2016-03-23 DIAGNOSIS — H35351 Cystoid macular degeneration, right eye: Secondary | ICD-10-CM | POA: Diagnosis not present

## 2016-03-23 DIAGNOSIS — H348112 Central retinal vein occlusion, right eye, stable: Secondary | ICD-10-CM | POA: Diagnosis not present

## 2016-03-24 DIAGNOSIS — E11319 Type 2 diabetes mellitus with unspecified diabetic retinopathy without macular edema: Secondary | ICD-10-CM | POA: Diagnosis not present

## 2016-04-06 DIAGNOSIS — G4733 Obstructive sleep apnea (adult) (pediatric): Secondary | ICD-10-CM | POA: Diagnosis not present

## 2016-05-15 DIAGNOSIS — E785 Hyperlipidemia, unspecified: Secondary | ICD-10-CM | POA: Diagnosis not present

## 2016-05-15 DIAGNOSIS — E08311 Diabetes mellitus due to underlying condition with unspecified diabetic retinopathy with macular edema: Secondary | ICD-10-CM | POA: Diagnosis not present

## 2016-05-22 ENCOUNTER — Other Ambulatory Visit (INDEPENDENT_AMBULATORY_CARE_PROVIDER_SITE_OTHER): Payer: BLUE CROSS/BLUE SHIELD

## 2016-05-22 DIAGNOSIS — E1165 Type 2 diabetes mellitus with hyperglycemia: Secondary | ICD-10-CM

## 2016-05-22 LAB — COMPREHENSIVE METABOLIC PANEL
ALT: 13 U/L (ref 0–35)
AST: 18 U/L (ref 0–37)
Albumin: 4.2 g/dL (ref 3.5–5.2)
Alkaline Phosphatase: 85 U/L (ref 39–117)
BUN: 18 mg/dL (ref 6–23)
CO2: 29 mEq/L (ref 19–32)
Calcium: 9.6 mg/dL (ref 8.4–10.5)
Chloride: 100 mEq/L (ref 96–112)
Creatinine, Ser: 1.15 mg/dL (ref 0.40–1.20)
GFR: 60.69 mL/min (ref >60.00)
Glucose, Bld: 108 mg/dL — ABNORMAL HIGH (ref 70–99)
Potassium: 4.4 mEq/L (ref 3.5–5.1)
Sodium: 137 mEq/L (ref 135–145)
Total Bilirubin: 0.5 mg/dL (ref 0.2–1.2)
Total Protein: 7.4 g/dL (ref 6.0–8.3)

## 2016-05-22 LAB — LIPID PANEL
Cholesterol: 139 mg/dL (ref 0–200)
HDL: 63.2 mg/dL (ref >39.00)
LDL Cholesterol: 64 mg/dL (ref 0–99)
NonHDL: 75.79
Total CHOL/HDL Ratio: 2
Triglycerides: 59 mg/dL (ref 0.0–149.0)
VLDL: 11.8 mg/dL (ref 0.0–40.0)

## 2016-05-22 LAB — MICROALBUMIN / CREATININE URINE RATIO
Creatinine,U: 130.6 mg/dL
Microalb Creat Ratio: 0.5 mg/g (ref 0.0–30.0)
Microalb, Ur: 0.7 mg/dL (ref 0.0–1.9)

## 2016-05-22 LAB — HEMOGLOBIN A1C: Hgb A1c MFr Bld: 7 % — ABNORMAL HIGH (ref 4.6–6.5)

## 2016-05-24 NOTE — Progress Notes (Signed)
Patient ID: Allison Matthews, female   DOB: February 24, 1951, 66 y.o.   MRN: [id number]   Reason for Appointment:  follow-up   History of Present Illness   Diagnosis: Type 2 DIABETES MELITUS, date of diagnosis:  2005    She was initially treated with insulin and subsequently was transitioned to [place] with excellent control Previously had upper normal A1c results and had been fairly compliant with diet and exercise Since early 2014 her A1c levels had been relatively higher Because of her higher A1c the Kombiglyze been increased to 2 tablets  in 03/2013  Recent history:   Oral hypoglycemic drugs: Kombiglyze XR 2.08/998, 1 tablet daily         For some time she is on Kombiglyze XR as monotherapy Her A1c previously was consistently under 7% and now it is improved at 7% compared to 7.4  Current management, blood sugar patterns and problems identified:  She again is resistant to the idea of taking 2 tablets of her Kombiglyze XR.  She thinks she feels bad when she takes both tablets although her sugar does not get low.  She is mostly taking this at lunchtime which is her main meal  She has checked readings mostly later in the evening and not postprandially after her lunch  Highest blood sugar 189 late afternoon, maybe higher because of eating out  Morning sugars are fairly stable with mild increase.  She has however done a little better with her diet and is losing weight  She again has not been motivated to do regular exercise except going up stairs at work   Side effects from medications: None  Monitors blood glucose:  every other day .    Glucometer:   Contour       Blood Glucose readings from   Mean values apply above for all meters except median for One Touch  PRE-MEAL Fasting Lunch Evening  Bedtime Overall  Glucose range: 109-145   72-189     Mean/median: 122   129   122    Hypoglycemia frequency:  none        Meals: 3 meals per day. Oatmeal in am,  evening meal maybe a sandwich or cereal      Physical activity: exercise: walking steps            Dietician visit: Most recent: 2009             Wt Readings from Last 3 Encounters:  05/25/16 163 lb (73.9 kg)  02/23/16 169 lb (76.7 kg)  11/22/15 169 lb (76.7 kg)   Diabetes Labs:  Lab Results  Component Value Date   HGBA1C 7.0 (H) 05/22/2016   HGBA1C 7.4 (H) 02/18/2016   HGBA1C 7.2 (H) 11/18/2015   Lab Results  Component Value Date   MICROALBUR 0.7 05/22/2016   LDLCALC 64 05/22/2016   CREATININE 1.15 05/22/2016    Other problems: See review of systems  Allergies as of 05/25/2016   No Known Allergies     Medication List       Accurate as of 05/25/16  8:17 AM. Always use your most recent med list.          BAYER CONTOUR NEXT MONITOR w/Device Kit Use to check blood sugar 2 times per day dx code E11.65   BAYER MICROLET LANCETS lancets Use as instructed to check blood sugar 2 times per day dx code E11.65   colchicine 0.6 MG tablet Reported on 08/18/2015   fluticasone 0.05 %  cream Commonly known as:  CUTIVATE Reported on 08/18/2015   glucose blood test strip Commonly known as:  BAYER CONTOUR NEXT TEST Use as instructed to check blood sugar 2 times per day dx codeE11.65   HYDROcodone-acetaminophen 7.5-325 MG tablet Commonly known as:  NORCO Reported on 08/18/2015   HYDROcodone-acetaminophen 5-325 MG tablet Commonly known as:  NORCO Take 1 tablet by mouth every 6 (six) hours as needed for severe pain.   KOMBIGLYZE XR 2.08-998 MG Tb24 Generic drug:  Saxagliptin-Metformin TAKE 2 TABLETS BY MOUTH EVERY DAY   levothyroxine 50 MCG tablet Commonly known as:  SYNTHROID, LEVOTHROID 50 mcg.   NONFORMULARY OR COMPOUNDED [place] compound:  Antiinflammatory cream - Diclofenac 3%, Baclofen 2%, Cyclobenzaprine 2%, Lidocaine 2%, dispense 120gram, apply 1-2 grams to affected area 3-4 times daily, +2refills.   NONFORMULARY OR COMPOUNDED ITEM [employer]  compound:  Combo Pain #1 - Baclofen 3%, Gabapentin *%, Aripiprazole 0.5%, Ketoralac 8%, Lidocaine 5%, dispense 240 grams, appy 1-2 pumps to focal area 3-4 times daily, +3refills.   rosuvastatin 20 MG tablet Commonly known as:  CRESTOR Take 1 tablet (20 mg total) by mouth daily.   valsartan-hydrochlorothiazide 160-25 MG tablet Commonly known as:  DIOVAN-HCT       Allergies: No Known Allergies  No past medical history on file.  Past Surgical History:  Procedure Laterality Date  . ABDOMINAL HYSTERECTOMY      Family History  Problem Relation Age of Onset  . Diabetes Mother     Social History:  reports that she has never smoked. She has never used smokeless tobacco. Her alcohol and drug histories are not on file.  Review of Systems:  She has had diabetic retinopathy treated with laser and follows with Dr. Zadie Rhine,  stable   HYPERTENSION:  she has had fairly good control of her blood pressure with Diovan HCT, half tablet daily  History of mild renal insufficiency: Creatinine is Slightly better   Lab Results  Component Value Date   CREATININE 1.15 05/22/2016    HYPERLIPIDEMIA: The lipid abnormality consists of elevated LDL.  She has finally started taking her simvastatin regularly at least in the last month and her lipids are much better    Lab Results  Component Value Date   CHOL 139 05/22/2016   HDL 63.20 05/22/2016   LDLCALC 64 05/22/2016   LDLDIRECT 144.0 02/18/2016   TRIG 59.0 05/22/2016   CHOLHDL 2 05/22/2016    Mild hypothyroidism: Adequately controlled with 50 g levothyroxine,  followed by PCP  Lab Results  Component Value Date   FREET4 0.89 10/09/2013   TSH 2.12 02/18/2016   TSH 0.85 02/15/2015   TSH 1.60 02/06/2014   Diabetic foot exam in 2/18 showed normal monofilament sensation in the toes and plantar surfaces, no skin lesions or ulcers on the feet and normal pedal pulses      Examination:   BP 112/68   Pulse 80   Ht _0  (1.651 m)   Wt  163 lb (73.9 kg)   SpO2 99%   BMI 27.12 kg/m   Body mass index is 27.12 kg/m.   Diabetic Foot Exam - Simple   Simple Foot Form Diabetic Foot exam was performed with the following findings:  Yes 05/25/2016  8:24 AM  Visual Inspection No deformities, no ulcerations, no other skin breakdown bilaterally:  Yes Sensation Testing Intact to touch and monofilament testing bilaterally:  Yes Pulse Check Posterior Tibialis and Dorsalis pulse intact bilaterally:  Yes Comments  ASSESSMENT/ PLAN:   Diabetes type 2  See history of present illness for detailed discussion of current diabetes management, blood sugar patterns and problems identified  A1c is slightly better at 7% Blood sugars may be relatively higher at times probably more after meals She is taking only 2.5 mg of Onglyza and she does not like to take 2 tablets of her Kombiglyze XR Also no regular exercise program  For now she agrees to change to the 08/998 on the Kombiglyze XR as she does not think she can tolerate higher doses of metformin  Hyperlipidemia:  LDL  excellent with her taking simvastatin regularly and advised her to continue   Blood pressure controlled with  Diovan HCT low-dose  Thyroid: TSH to be checked on the next visit  She needs to check with her PCP if she has had Prevnar   [facility] 05/25/2016, 8:17 AM   Lab on 05/22/2016  Component Date Value Ref Range Status  . Hgb A1c MFr Bld 05/22/2016 7.0* 4.6 - 6.5 % Final  . Sodium 05/22/2016 137  135 - 145 mEq/L Final  . Potassium 05/22/2016 4.4  3.5 - 5.1 mEq/L Final  . Chloride 05/22/2016 100  96 - 112 mEq/L Final  . CO2 05/22/2016 29  19 - 32 mEq/L Final  . Glucose, Bld 05/22/2016 108* 70 - 99 mg/dL Final  . BUN 05/22/2016 18  6 - 23 mg/dL Final  . Creatinine, Ser 05/22/2016 1.15  0.40 - 1.20 mg/dL Final  . Total Bilirubin 05/22/2016 0.5  0.2 - 1.2 mg/dL Final  . Alkaline Phosphatase 05/22/2016 85  39 - 117 U/L Final  . AST 05/22/2016 18  0 - 37  U/L Final  . ALT 05/22/2016 13  0 - 35 U/L Final  . Total Protein 05/22/2016 7.4  6.0 - 8.3 g/dL Final  . Albumin 05/22/2016 4.2  3.5 - 5.2 g/dL Final  . Calcium 05/22/2016 9.6  8.4 - 10.5 mg/dL Final  . GFR 05/22/2016 60.69  >60.00 mL/min Final  . Microalb, Ur 05/22/2016 0.7  0.0 - 1.9 mg/dL Final  . Creatinine,U 05/22/2016 130.6  mg/dL Final  . Microalb Creat Ratio 05/22/2016 0.5  0.0 - 30.0 mg/g Final  . Cholesterol 05/22/2016 139  0 - 200 mg/dL Final  . Triglycerides 05/22/2016 59.0  0.0 - 149.0 mg/dL Final  . HDL 05/22/2016 63.20  >39.00 mg/dL Final  . VLDL 05/22/2016 11.8  0.0 - 40.0 mg/dL Final  . LDL Cholesterol 05/22/2016 64  0 - 99 mg/dL Final  . Total CHOL/HDL Ratio 05/22/2016 2   Final  . NonHDL 05/22/2016 75.79   Final

## 2016-05-25 ENCOUNTER — Ambulatory Visit (INDEPENDENT_AMBULATORY_CARE_PROVIDER_SITE_OTHER): Payer: BLUE CROSS/BLUE SHIELD | Admitting: Endocrinology

## 2016-05-25 ENCOUNTER — Encounter: Payer: Self-pay | Admitting: Endocrinology

## 2016-05-25 VITALS — BP 112/68 | HR 80 | Ht 65.0 in | Wt 163.0 lb

## 2016-05-25 DIAGNOSIS — E119 Type 2 diabetes mellitus without complications: Secondary | ICD-10-CM | POA: Diagnosis not present

## 2016-05-25 MED ORDER — SAXAGLIPTIN-METFORMIN ER 5-1000 MG PO TB24: ORAL_TABLET | ORAL | 4 refills | Status: DC

## 2016-05-25 NOTE — Patient Instructions (Signed)
Walk daily 

## 2016-07-08 ENCOUNTER — Other Ambulatory Visit: Payer: Self-pay | Admitting: Endocrinology

## 2016-07-21 ENCOUNTER — Other Ambulatory Visit: Payer: Self-pay | Admitting: Endocrinology

## 2016-08-28 ENCOUNTER — Other Ambulatory Visit: Payer: Self-pay | Admitting: Endocrinology

## 2016-09-14 DIAGNOSIS — E119 Type 2 diabetes mellitus without complications: Secondary | ICD-10-CM | POA: Diagnosis not present

## 2016-09-14 DIAGNOSIS — T7840XS Allergy, unspecified, sequela: Secondary | ICD-10-CM | POA: Diagnosis not present

## 2016-09-14 DIAGNOSIS — I10 Essential (primary) hypertension: Secondary | ICD-10-CM | POA: Diagnosis not present

## 2016-09-19 ENCOUNTER — Other Ambulatory Visit (INDEPENDENT_AMBULATORY_CARE_PROVIDER_SITE_OTHER): Payer: BLUE CROSS/BLUE SHIELD

## 2016-09-19 DIAGNOSIS — E119 Type 2 diabetes mellitus without complications: Secondary | ICD-10-CM | POA: Diagnosis not present

## 2016-09-19 LAB — HEMOGLOBIN A1C: Hgb A1c MFr Bld: 7.3 % — ABNORMAL HIGH (ref 4.6–6.5)

## 2016-09-19 LAB — BASIC METABOLIC PANEL
BUN: 17 mg/dL (ref 6–23)
CO2: 31 mEq/L (ref 19–32)
Calcium: 10.3 mg/dL (ref 8.4–10.5)
Chloride: 100 mEq/L (ref 96–112)
Creatinine, Ser: 1.15 mg/dL (ref 0.40–1.20)
GFR: 60.63 mL/min (ref >60.00)
Glucose, Bld: 110 mg/dL — ABNORMAL HIGH (ref 70–99)
Potassium: 4.7 mEq/L (ref 3.5–5.1)
Sodium: 137 mEq/L (ref 135–145)

## 2016-09-21 NOTE — Progress Notes (Signed)
Patient ID: Allison Matthews, female   DOB: 01-26-1951, 66 y.o.   MRN: [id number]   Reason for Appointment:  follow-up   History of Present Illness   Diagnosis: Type 2 DIABETES MELITUS, date of diagnosis:  2005    She was initially treated with insulin and subsequently was transitioned to [place] XR with excellent control Previously had upper normal A1c results and had been fairly compliant with diet and exercise Since early 2014 her A1c levels had been relatively higher Because of her higher A1c the Kombiglyze been increased to 2 tablets  in 03/2013  Recent history:   Oral hypoglycemic drugs: Kombiglyze XR 08/998, 1 tablet daily         For some time she is on Kombiglyze XR as monotherapy Her A1c previously was consistently under 7% and now it is again relatively higher at 7.3  Current management, blood sugar patterns and problems identified:  She was prescribed day 08/998 Kombiglyze XR since she does not like to take 2 tablets of the smaller dose of Onglyza  However she thinks her pharmacy did not change her prescription until about 3 weeks ago and not clear why she did not ask for the new prescription  Her blood sugars are looking about the same as before but her A1c is higher  She is still not trying to find the time to exercise or walk despite repeated reminders, she says that she has not had the time in the evenings to do any exercise  Her weight is about the same  She is mostly trying to eat healthy meals  However she is not planning her meals in the evening and some time 8 cereal.  Also still eating oatmeal in the morning without protein  Although she is checking her sugars fairly regularly these are mostly in the mornings and has been as high as 164  Has a few readings in the afternoon and only one reading at night which are not high except 186 after lunch last month   Side effects from medications: None  Monitors blood glucose:  every other day .     Glucometer: Contour  Contour       Blood Glucose readings from download:  Mean values apply above for all meters except median for One Touch  PRE-MEAL Fasting Lunch Dinner Bedtime Overall  Glucose range:  91-1 64    82, 114    Mean/median: 131     125    POST-MEAL PC Breakfast PC Lunch PC Dinner  Glucose range:  125-186    Mean/median:       Hypoglycemia:  none        Meals: 3 meals per day. Oatmeal in am, evening meal maybe a sandwich/cereal    Physical activity: exercise: walking steps            Dietician visit: Most recent: 2009             Wt Readings from Last 3 Encounters:  09/22/16 163 lb 12.8 oz (74.3 kg)  05/25/16 163 lb (73.9 kg)  02/23/16 169 lb (76.7 kg)   Diabetes Labs:  Lab Results  Component Value Date   HGBA1C 7.3 (H) 09/19/2016   HGBA1C 7.0 (H) 05/22/2016   HGBA1C 7.4 (H) 02/18/2016   Lab Results  Component Value Date   MICROALBUR 0.7 05/22/2016   LDLCALC 64 05/22/2016   CREATININE 1.15 09/19/2016    Other problems: See review of systems  Allergies as of 09/22/2016  No Known Allergies     Medication List       Accurate as of 09/22/16  8:16 AM. Always use your most recent med list.          BAYER CONTOUR NEXT MONITOR w/Device Kit Use to check blood sugar 2 times per day dx code E11.65   BAYER CONTOUR NEXT TEST test strip Generic drug:  glucose blood USE AS INSTRUCTED TO CHECK BLOOD SUGAR 2 TIMES PER DAY DX CODEE11.65   BAYER MICROLET LANCETS lancets Use as instructed to check blood sugar 2 times per day dx code E11.65   colchicine 0.6 MG tablet Reported on 08/18/2015   fluticasone 0.05 % cream Commonly known as:  CUTIVATE Reported on 08/18/2015   KOMBIGLYZE XR 08-998 MG Tb24 Generic drug:  Saxagliptin-Metformin Take 1 tablet by mouth daily.   levothyroxine 50 MCG tablet Commonly known as:  SYNTHROID, LEVOTHROID 50 mcg.   NONFORMULARY OR COMPOUNDED [place] compound:  Antiinflammatory cream - Diclofenac 3%,  Baclofen 2%, Cyclobenzaprine 2%, Lidocaine 2%, dispense 120gram, apply 1-2 grams to affected area 3-4 times daily, +2refills.   NONFORMULARY OR COMPOUNDED ITEM [employer] compound:  Combo Pain #1 - Baclofen 3%, Gabapentin *%, Aripiprazole 0.5%, Ketoralac 8%, Lidocaine 5%, dispense 240 grams, appy 1-2 pumps to focal area 3-4 times daily, +3refills.   rosuvastatin 20 MG tablet Commonly known as:  CRESTOR TAKE 1 TABLET BY MOUTH DAILY.   valsartan-hydrochlorothiazide 160-25 MG tablet Commonly known as:  DIOVAN-HCT       Allergies: No Known Allergies  No past medical history on file.  Past Surgical History:  Procedure Laterality Date  . ABDOMINAL HYSTERECTOMY      Family History  Problem Relation Age of Onset  . Diabetes Mother     Social History:  reports that she has never smoked. She has never used smokeless tobacco. Her alcohol and drug histories are not on file.  Review of Systems:  She has had diabetic retinopathy treated with laser and follows with Dr. Zadie Rhine,  Stable, last Exam 2/18   HYPERTENSION:  she has had fairly good control of her blood pressure with Diovan HCT, half tablet daily  History of mild renal insufficiency: Creatinine is Normal   Lab Results  Component Value Date   CREATININE 1.15 09/19/2016    HYPERLIPIDEMIA: The lipid abnormality consists of elevated LDL.  She has Been taking her simvastatin regularly    Lab Results  Component Value Date   CHOL 139 05/22/2016   HDL 63.20 05/22/2016   LDLCALC 64 05/22/2016   LDLDIRECT 144.0 02/18/2016   TRIG 59.0 05/22/2016   CHOLHDL 2 05/22/2016    Mild hypothyroidism: Adequately controlled with 50 g levothyroxine,  followed by PCP Also  Lab Results  Component Value Date   FREET4 0.89 10/09/2013   TSH 2.12 02/18/2016   TSH 0.85 02/15/2015   TSH 1.60 02/06/2014   Diabetic foot exam in 2/18 showed normal monofilament sensation in the toes and plantar surfaces, no skin lesions or ulcers on the  feet and normal pedal pulses      Examination:   BP 130/82   Pulse 69   Ht 5' 5" (1.651 m)   Wt 163 lb 12.8 oz (74.3 kg)   SpO2 97%   BMI 27.26 kg/m   Body mass index is 27.26 kg/m.     ASSESSMENT/ PLAN:   Diabetes type 2  See history of present illness for detailed discussion of current diabetes management, blood sugar patterns and problems  identified  A1c is slightly higher at  7.3 She thinks that her sugars are improving with switching to the higher dose of the Onglyza in her Kombiglyze XR but this is not evident on her home monitoring as yet Since she wants to try the new dose for longer time and overall try to do better with lifestyle changes will not change her medication She agrees to try and walk at lunchtime She needs to add protein to each meal consistently Check more readings after supper or other meals  Hyperlipidemia:  LDL  excellent with her taking simvastatin regularly and will recheck on next visit  Blood pressure controlled with  Diovan HCT   Thyroid: TSH to be checked on the next visit  Prevnar given   [facility] 09/22/2016, 8:16 AM   Lab on 09/19/2016  Component Date Value Ref Range Status  . Hgb A1c MFr Bld 09/19/2016 7.3* 4.6 - 6.5 % Final   Glycemic Control Guidelines for People with Diabetes:Non Diabetic:  <6%Goal of Therapy: <7%Additional Action Suggested:  >8%   . Sodium 09/19/2016 137  135 - 145 mEq/L Final  . Potassium 09/19/2016 4.7  3.5 - 5.1 mEq/L Final  . Chloride 09/19/2016 100  96 - 112 mEq/L Final  . CO2 09/19/2016 31  19 - 32 mEq/L Final  . Glucose, Bld 09/19/2016 110* 70 - 99 mg/dL Final  . BUN 09/19/2016 17  6 - 23 mg/dL Final  . Creatinine, Ser 09/19/2016 1.15  0.40 - 1.20 mg/dL Final  . Calcium 09/19/2016 10.3  8.4 - 10.5 mg/dL Final  . GFR 09/19/2016 60.63  >60.00 mL/min Final

## 2016-09-22 ENCOUNTER — Encounter: Payer: Self-pay | Admitting: Endocrinology

## 2016-09-22 ENCOUNTER — Ambulatory Visit (INDEPENDENT_AMBULATORY_CARE_PROVIDER_SITE_OTHER): Payer: BLUE CROSS/BLUE SHIELD | Admitting: Endocrinology

## 2016-09-22 VITALS — BP 130/82 | HR 69 | Ht 65.0 in | Wt 163.8 lb

## 2016-09-22 DIAGNOSIS — Z23 Encounter for immunization: Secondary | ICD-10-CM | POA: Diagnosis not present

## 2016-09-22 DIAGNOSIS — E1165 Type 2 diabetes mellitus with hyperglycemia: Secondary | ICD-10-CM

## 2016-09-22 DIAGNOSIS — E063 Autoimmune thyroiditis: Secondary | ICD-10-CM

## 2016-09-22 NOTE — Patient Instructions (Signed)
Check blood sugars on waking up  2-3/7  Also check blood sugars about 2 hours after a meal and do this after different meals by rotation  Recommended blood sugar levels on waking up is 90-130 and about 2 hours after meal is 130-160  Please bring your blood sugar monitor to each visit, thank you  Walk at lunch

## 2016-11-21 ENCOUNTER — Telehealth: Payer: Self-pay | Admitting: Podiatry

## 2016-11-21 ENCOUNTER — Encounter: Payer: Self-pay | Admitting: Podiatry

## 2016-11-21 ENCOUNTER — Ambulatory Visit (INDEPENDENT_AMBULATORY_CARE_PROVIDER_SITE_OTHER): Payer: BLUE CROSS/BLUE SHIELD | Admitting: Podiatry

## 2016-11-21 DIAGNOSIS — M7662 Achilles tendinitis, left leg: Secondary | ICD-10-CM | POA: Diagnosis not present

## 2016-11-21 DIAGNOSIS — M7732 Calcaneal spur, left foot: Secondary | ICD-10-CM

## 2016-11-21 MED ORDER — MELOXICAM 7.5 MG PO TABS: 7.5000 mg | ORAL_TABLET | Freq: Every day | ORAL | 0 refills | Status: DC

## 2016-11-21 NOTE — Telephone Encounter (Signed)
I put a short medium cam walker up front for when the pt comes in.

## 2016-11-21 NOTE — Progress Notes (Signed)
Subjective: [name] presents to the office today for concerns of pain to the back of the left heel. She also points the Achilles tendon where she gets pain he weekend but she denies an recent injury or trauma. She has noticed a small amount of swelling to the area. She has had no recent treatment since this started.  Denies any systemic complaints such as fevers, chills, nausea, vomiting. No acute changes since last appointment, and no other complaints at this time.   Objective: AAO x3, NAD DP/PT pulses palpable bilaterally, CRT less than 3 seconds Along the posterior aspect of the left heel along the insertion of the Achilles tendon there is tenderness to palpation and there is a small retrocalcanetosis palpable. There is also tenderness along the mid-substance of the achilles tendon. Thompson test is negative, achilles tendon intact. There is no erythema or increase in warmth but there is swelling along the tendon. No area of pinpoint tenderness and no pain with lateral compression of the calcaneous.  No open lesions or pre-ulcerative lesions.  No pain with calf compression, swelling, warmth, erythema  Assessment: Achilles tendonitis; retrocalcaneal heel spur  Plan: -All treatment options discussed with the patient including all alternatives, risks, complications.  -At this time given her pain and swelling, I have recommended a CAM boot. She did try an ankle brace and tall CAM boot but this hurt the foot more. Dispensed short CAM walker.  -Ice -Limit activity -Prescribed mobic. Discussed side effects of the medication and directed to stop if any are to occur and call the office.  -RTC 2 weeks or sooner if needed.  -Patient encouraged to call the office with any questions, concerns, change in symptoms.   Ovid CurdMatthew Leelah Hanna, DPM

## 2016-11-21 NOTE — Telephone Encounter (Signed)
Misty StanleyLisa- can you please call her? Thanks.

## 2016-11-21 NOTE — Telephone Encounter (Signed)
Yes, please have her come back for the boot.

## 2016-11-21 NOTE — Telephone Encounter (Signed)
I was just seen by Dr. Ardelle AntonWagoner and he tried me in a boot but then put me in an ankle brace instead. The brace is causing me severe pain. I wanted to know if I could come back to the [facility] and get the boot.

## 2016-11-21 NOTE — Telephone Encounter (Signed)
Thank you Shanda BumpsJessica. Misty StanleyLisa

## 2016-11-21 NOTE — Telephone Encounter (Signed)
Called patient and the patient will be coming by [place] to pick up a short medium air fracture walker and returning the tri-lock medium and was closer for the patient to come to  today. Misty StanleyLisa

## 2016-11-21 NOTE — Patient Instructions (Signed)
Achilles Tendinitis  Achilles tendinitis is inflammation of the tough, cord-like band that attaches the lower leg muscles to the heel bone (Achilles tendon). This is usually caused by overusing the tendon and the ankle joint.  Achilles tendinitis usually gets better over time with treatment and caring for yourself at home. It can take weeks or months to heal completely.  What are the causes?  This condition may be caused by:  · A sudden increase in exercise or activity, such as running.  · Doing the same exercises or activities (such as jumping) over and over.  · Not warming up calf muscles before exercising.  · Exercising in shoes that are worn out or not made for exercise.  · Having arthritis or a bone growth (spur) on the back of the heel bone. This can rub against the tendon and hurt it.  · Age-related wear and tear. Tendons become less flexible with age and more likely to be injured.    What are the signs or symptoms?  Common symptoms of this condition include:  · Pain in the Achilles tendon or in the back of the leg, just above the heel. The pain usually gets worse with exercise.  · Stiffness or soreness in the back of the leg, especially in the morning.  · Swelling of the skin over the Achilles tendon.  · Thickening of the tendon.  · Bone spurs at the bottom of the Achilles tendon, near the heel.  · Trouble standing on tiptoe.    How is this diagnosed?  This condition is diagnosed based on your symptoms and a physical exam. You may have tests, including:  · X-rays.  · MRI.    How is this treated?  The goal of treatment is to relieve symptoms and help your injury heal. Treatment may include:  · Decreasing or stopping activities that caused the tendinitis. This may mean switching to low-impact exercises like biking or swimming.  · Icing the injured area.  · Doing physical therapy, including strengthening and stretching exercises.  · NSAIDs to help relieve pain and swelling.  · Using supportive shoes, wraps,  heel lifts, or a walking boot (air cast).  · Surgery. This may be done if your symptoms do not improve after 6 months.  · Using high-energy shock wave impulses to stimulate the healing process (extracorporeal shock wave therapy). This is rare.  · Injection of medicines to help relieve inflammation (corticosteroids). This is rare.    Follow these instructions at home:  If you have an air cast:   · Wear the cast as told by your health care provider. Remove it only as told by your health care provider.  · Loosen the cast if your toes tingle, become numb, or turn cold and blue.  Activity   · Gradually return to your normal activities once your health care provider approves. Do not do activities that cause pain.  ? Consider doing low-impact exercises, like cycling or swimming.  · If you have an air cast, ask your health care provider when it is safe for you to drive.  · If physical therapy was prescribed, do exercises as told by your health care provider or physical therapist.  Managing pain, stiffness, and swelling   · Raise (elevate) your foot above the level of your heart while you are sitting or lying down.  · Move your toes often to avoid stiffness and to lessen swelling.  · If directed, put ice on the injured area:  ?   Put ice in a plastic bag.  ? Place a towel between your skin and the bag.  ? Leave the ice on for 20 minutes, 2-3 times a day  General instructions   · If directed, wrap your foot with an elastic bandage or other wrap. This can help keep your tendon from moving too much while it heals. Your health care provider will show you how to wrap your foot correctly.  · Wear supportive shoes or heel lifts only as told by your health care provider.  · Take over-the-counter and prescription medicines only as told by your health care provider.  · Keep all follow-up visits as told by your health care provider. This is important.  Contact a health care provider if:  · You have symptoms that gets worse.  · You have  pain that does not get better with medicine.  · You develop new, unexplained symptoms.  · You develop warmth and swelling in your foot.  · You have a fever.  Get help right away if:  · You have a sudden popping sound or sensation in your Achilles tendon followed by severe pain.  · You cannot move your toes or foot.  · You cannot put any weight on your foot.  Summary  · Achilles tendinitis is inflammation of the tough, cord-like band that attaches the lower leg muscles to the heel bone (Achilles tendon).  · This condition is usually caused by overusing the tendon and the ankle joint. It can also be caused by arthritis or normal aging.  · The most common symptoms of this condition include pain, swelling, or stiffness in the Achilles tendon or in the back of the leg.  · This condition is usually treated with rest, NSAIDs, and physical therapy.  This information is not intended to replace advice given to you by your health care provider. Make sure you discuss any questions you have with your health care provider.  Document Released: 01/04/2005 Document Revised: 02/14/2016 Document Reviewed: 02/14/2016  Elsevier Interactive Patient Education © 2017 [employer]

## 2016-12-05 ENCOUNTER — Encounter: Payer: Self-pay | Admitting: Podiatry

## 2016-12-05 ENCOUNTER — Ambulatory Visit (INDEPENDENT_AMBULATORY_CARE_PROVIDER_SITE_OTHER): Payer: BLUE CROSS/BLUE SHIELD | Admitting: Podiatry

## 2016-12-05 DIAGNOSIS — M7662 Achilles tendinitis, left leg: Secondary | ICD-10-CM | POA: Diagnosis not present

## 2016-12-05 NOTE — Progress Notes (Signed)
Subjective: Allison Matthews presents to the office today for follow-up evaluation of Achilles tendinitis, heel spur to the left heel. She states that she is doing "so much better". She states that she still gets an achy sensations but the study. She is having previously has resolved. She did try carotid were regular shoe but the pressure to the back of the heel was causing irritation and pain that she is stated in the boot. She has been using a topical anti-inflammatory cream that ordered for her previously. No recent injury or trauma. No swelling that she has noticed. Denies any systemic complaints such as fevers, chills, nausea, vomiting. No acute changes since last appointment, and no other complaints at this time.   Objective: AAO x3, NAD DP/PT pulses palpable bilaterally, CRT less than 3 seconds Is minimal tenderness palpation along the Achilles tendon on the left side. Thompson test is negative and the tendon appears to be intact. Majority tenderness is upon dorsiflexion of the ankle and there is equinus present. There is no pain with lateral compression of calcaneus there is no other areas of tenderness identified at this time. No open lesions or pre-ulcerative lesions.  No pain with calf compression, swelling, warmth, erythema  Assessment: Achilles tendinitis left side  Plan: -All treatment options discussed with the patient including all alternatives, risks, complications.  -At this point on a rehabilitation exercises and strengthening exercises for the Achilles tendon which were discussed with her. I also gave her prescription for physical [facility] for [facility]. She was started on her own but she's had a problem next week she's going to start. -Continue topical antibiotic anti-inflammatory -Offloading pad was dispensed as she transitions to a shoe to help take pressure off the back of the heel. -Discussed shoe modifications and orthotics. She has OTC inserts that she is  going to try first.  -RTC 3-4 weeks or sooner if needed.  -Patient encouraged to call the office with any questions, concerns, change in symptoms.   Jillyn Ledger, DPM

## 2016-12-18 ENCOUNTER — Other Ambulatory Visit: Payer: Self-pay | Admitting: Podiatry

## 2016-12-25 ENCOUNTER — Other Ambulatory Visit: Payer: BLUE CROSS/BLUE SHIELD

## 2016-12-27 ENCOUNTER — Other Ambulatory Visit (INDEPENDENT_AMBULATORY_CARE_PROVIDER_SITE_OTHER): Payer: BLUE CROSS/BLUE SHIELD

## 2016-12-27 DIAGNOSIS — E1165 Type 2 diabetes mellitus with hyperglycemia: Secondary | ICD-10-CM | POA: Diagnosis not present

## 2016-12-27 DIAGNOSIS — E063 Autoimmune thyroiditis: Secondary | ICD-10-CM | POA: Diagnosis not present

## 2016-12-27 LAB — COMPREHENSIVE METABOLIC PANEL
ALT: 13 U/L (ref 0–35)
AST: 17 U/L (ref 0–37)
Albumin: 4.3 g/dL (ref 3.5–5.2)
Alkaline Phosphatase: 80 U/L (ref 39–117)
BUN: 22 mg/dL (ref 6–23)
CO2: 29 mEq/L (ref 19–32)
Calcium: 10.4 mg/dL (ref 8.4–10.5)
Chloride: 99 mEq/L (ref 96–112)
Creatinine, Ser: 1.21 mg/dL — ABNORMAL HIGH (ref 0.40–1.20)
GFR: 57.13 mL/min — ABNORMAL LOW (ref >60.00)
Glucose, Bld: 106 mg/dL — ABNORMAL HIGH (ref 70–99)
Potassium: 4.1 mEq/L (ref 3.5–5.1)
Sodium: 138 mEq/L (ref 135–145)
Total Bilirubin: 0.5 mg/dL (ref 0.2–1.2)
Total Protein: 8 g/dL (ref 6.0–8.3)

## 2016-12-27 LAB — LIPID PANEL
Cholesterol: 138 mg/dL (ref 0–200)
HDL: 76 mg/dL (ref >39.00)
LDL Cholesterol: 45 mg/dL (ref 0–99)
NonHDL: 61.69
Total CHOL/HDL Ratio: 2
Triglycerides: 84 mg/dL (ref 0.0–149.0)
VLDL: 16.8 mg/dL (ref 0.0–40.0)

## 2016-12-27 LAB — TSH: TSH: 2.22 u[IU]/mL (ref 0.35–4.50)

## 2016-12-27 LAB — HEMOGLOBIN A1C: Hgb A1c MFr Bld: 7 % — ABNORMAL HIGH (ref 4.6–6.5)

## 2016-12-28 ENCOUNTER — Ambulatory Visit: Payer: BLUE CROSS/BLUE SHIELD | Admitting: Endocrinology

## 2016-12-28 NOTE — Progress Notes (Signed)
Patient ID: Allison Matthews, female   DOB: [birth year], 66 y.o.   MRN: [id number]   Reason for Appointment:  follow-up   History of Present Illness   Diagnosis: Type 2 DIABETES MELITUS, date of diagnosis:  2005    She was initially treated with insulin and subsequently was transitioned to [place] with excellent control Previously had upper normal A1c results and had been fairly compliant with diet and exercise Since early 2014 her A1c levels had been relatively higher Because of her higher A1c the Kombiglyze been increased to 2 tablets  in 03/2013  Recent history:   Oral hypoglycemic drugs: Kombiglyze XR 08/998, 1 tablet daily         For some time she is on Kombiglyze XR as monotherapy  Her A1c previously was consistently under 7% and now it is 7% compared to 7.3  Current management, blood sugar patterns and problems identified:  She has been on the 08/998 Kombiglyze XR dosage since she  difficulty with 2 tablets of the 2.08/998 regimen, this was changed reading before her last visit  Her blood sugars appear to be somewhat better overall  FASTING blood sugars averaging 120, previously about 131  Still not doing many readings after meals but has some readings midday and afternoon which are fairly good  She also has started walking more regularly than before  Her weight has come down 4 pounds   She is mostly trying to eat healthy meals with trying to plan her meals better and avoiding as much cereal in the morning    Side effects from medications: None  Monitors blood glucose:  every other day .    Glucometer: Contour        Blood Glucose readings from download as above :         Meals: 3 meals per day. Oatmeal in am, evening meal maybe a sandwich Physical activity: exercise: walking steps, Some walking outside             Dietician visit: Most recent: 2009             Wt Readings from Last 3 Encounters:  12/29/16 159 lb 3.2 oz (72.2 kg)  09/22/16  163 lb 12.8 oz (74.3 kg)  05/25/16 163 lb (73.9 kg)   Diabetes Labs:  Lab Results  Component Value Date   HGBA1C 7.0 (H) 12/27/2016   HGBA1C 7.3 (H) 09/19/2016   HGBA1C 7.0 (H) 05/22/2016   Lab Results  Component Value Date   MICROALBUR 0.7 05/22/2016   LDLCALC 45 12/27/2016   CREATININE 1.21 (H) 12/27/2016    Other problems: See review of systems  Allergies as of 12/29/2016   No Known Allergies     Medication List       Accurate as of 12/29/16  8:39 AM. Always use your most recent med list.          BAYER CONTOUR NEXT MONITOR w/Device Kit Use to check blood sugar 2 times per day dx code E11.65   BAYER CONTOUR NEXT TEST test strip Generic drug:  glucose blood USE AS INSTRUCTED TO CHECK BLOOD SUGAR 2 TIMES PER DAY DX CODEE11.65   BAYER MICROLET LANCETS lancets Use as instructed to check blood sugar 2 times per day dx code E11.65   colchicine 0.6 MG tablet Reported on 08/18/2015   fluticasone 0.05 % cream Commonly known as:  CUTIVATE Reported on 08/18/2015   KOMBIGLYZE XR 08-998 MG Tb24 Generic drug:  Saxagliptin-Metformin Take 1 tablet  by mouth daily.   levothyroxine 50 MCG tablet Commonly known as:  SYNTHROID, LEVOTHROID 50 mcg.   meloxicam 7.5 MG tablet Commonly known as:  MOBIC TAKE 1 TABLET BY MOUTH EVERY DAY   NONFORMULARY OR COMPOUNDED [place] compound:  Antiinflammatory cream - Diclofenac 3%, Baclofen 2%, Cyclobenzaprine 2%, Lidocaine 2%, dispense 120gram, apply 1-2 grams to affected area 3-4 times daily, +2refills.   NONFORMULARY OR COMPOUNDED ITEM [employer] compound:  Combo Pain #1 - Baclofen 3%, Gabapentin *%, Aripiprazole 0.5%, Ketoralac 8%, Lidocaine 5%, dispense 240 grams, appy 1-2 pumps to focal area 3-4 times daily, +3refills.   rosuvastatin 20 MG tablet Commonly known as:  CRESTOR TAKE 1 TABLET BY MOUTH DAILY.   valsartan-hydrochlorothiazide 160-25 MG tablet Commonly known as:  DIOVAN-HCT       Allergies: No Known  Allergies  No past medical history on file.  Past Surgical History:  Procedure Laterality Date  . ABDOMINAL HYSTERECTOMY      Family History  Problem Relation Age of Onset  . Diabetes Mother     Social History:  reports that she has never smoked. She has never used smokeless tobacco. Her alcohol and drug histories are not on file.  Review of Systems:  She has had diabetic retinopathy treated with laser and follows with Dr. Zadie Rhine,  Stable, last Exam 2/18   HYPERTENSION:  she has had fairly good control of her blood pressure with Diovan HCT, half tablet daily  History of mild renal insufficiency: Creatinine is Consistently near Normal   Lab Results  Component Value Date   CREATININE 1.21 (H) 12/27/2016    HYPERLIPIDEMIA: The lipid abnormality consists of elevated LDL.  She has Been taking her simvastatin regularly  LDL is somewhat better than the last time   Lab Results  Component Value Date   CHOL 138 12/27/2016   HDL 76.00 12/27/2016   LDLCALC 45 12/27/2016   LDLDIRECT 144.0 02/18/2016   TRIG 84.0 12/27/2016   CHOLHDL 2 12/27/2016    Mild hypothyroidism: Adequately controlled with 50 g levothyroxine   Lab Results  Component Value Date   FREET4 0.89 10/09/2013   TSH 2.22 12/27/2016   TSH 2.12 02/18/2016   TSH 0.85 02/15/2015   Diabetic foot exam in 2/18 showed normal monofilament sensation in the toes and plantar surfaces, no skin lesions or ulcers on the feet and normal pedal pulses      Examination:   BP 112/68   Pulse 81   Ht _0  (1.651 m)   Wt 159 lb 3.2 oz (72.2 kg)   SpO2 97%   BMI 26.49 kg/m   Body mass index is 26.49 kg/m.     ASSESSMENT/ PLAN:   Diabetes type 2  See history of present illness for detailed discussion of current diabetes management, blood sugar patterns and problems identified  A1c is slightly better at 7% This may be from her trying to be more active and walking when she can Still taking relatively simple  regimen for her diabetes management with only Kombiglyze XR 08/998 Most of her blood sugars are looking fairly good although checking somewhat sporadically She will continue the same regimen  Hyperlipidemia:  LDL  excellent with her taking simvastatin regularly, improved on this visit    Blood pressure controlled with  Diovan HCT   This is described by her PCP and may potentially need a lower dose if blood pressure is consistently low normal  Thyroid: TSH normal and she will continue 50 g of  levothyroxine  She has had the flu vaccine at work, not clear if this was a high dose  Cyrus Ramsburg 12/29/2016, 8:39 AM   Lab on 12/27/2016  Component Date Value Ref Range Status  . Hgb A1c MFr Bld 12/27/2016 7.0* 4.6 - 6.5 % Final   Glycemic Control Guidelines for People with Diabetes:Non Diabetic:  <6%Goal of Therapy: <7%Additional Action Suggested:  >8%   . Sodium 12/27/2016 138  135 - 145 mEq/L Final  . Potassium 12/27/2016 4.1  3.5 - 5.1 mEq/L Final  . Chloride 12/27/2016 99  96 - 112 mEq/L Final  . CO2 12/27/2016 29  19 - 32 mEq/L Final  . Glucose, Bld 12/27/2016 106* 70 - 99 mg/dL Final  . BUN 12/27/2016 22  6 - 23 mg/dL Final  . Creatinine, Ser 12/27/2016 1.21* 0.40 - 1.20 mg/dL Final  . Total Bilirubin 12/27/2016 0.5  0.2 - 1.2 mg/dL Final  . Alkaline Phosphatase 12/27/2016 80  39 - 117 U/L Final  . AST 12/27/2016 17  0 - 37 U/L Final  . ALT 12/27/2016 13  0 - 35 U/L Final  . Total Protein 12/27/2016 8.0  6.0 - 8.3 g/dL Final  . Albumin 12/27/2016 4.3  3.5 - 5.2 g/dL Final  . Calcium 12/27/2016 10.4  8.4 - 10.5 mg/dL Final  . GFR 12/27/2016 57.13* >60.00 mL/min Final  . Cholesterol 12/27/2016 138  0 - 200 mg/dL Final   ATP III Classification       Desirable:  < 200 mg/dL               Borderline High:  200 - 239 mg/dL          High:  > = 240 mg/dL  . Triglycerides 12/27/2016 84.0  0.0 - 149.0 mg/dL Final   Normal:  <150 mg/dLBorderline High:  150 - 199 mg/dL  . HDL 12/27/2016 76.00   >39.00 mg/dL Final  . VLDL 12/27/2016 16.8  0.0 - 40.0 mg/dL Final  . LDL Cholesterol 12/27/2016 45  0 - 99 mg/dL Final  . Total CHOL/HDL Ratio 12/27/2016 2   Final                  Men          Women1/2 Average Risk     3.4          3.3Average Risk          5.0          4.42X Average Risk          9.6          7.13X Average Risk          15.0          11.0                      . NonHDL 12/27/2016 61.69   Final   NOTE:  Non-HDL goal should be 30 mg/dL higher than patient's LDL goal (i.e. LDL goal of < 70 mg/dL, would have non-HDL goal of < 100 mg/dL)  . TSH 12/27/2016 2.22  0.35 - 4.50 uIU/mL Final

## 2016-12-29 ENCOUNTER — Encounter: Payer: Self-pay | Admitting: Endocrinology

## 2016-12-29 ENCOUNTER — Ambulatory Visit (INDEPENDENT_AMBULATORY_CARE_PROVIDER_SITE_OTHER): Payer: BLUE CROSS/BLUE SHIELD | Admitting: Endocrinology

## 2016-12-29 VITALS — BP 112/68 | HR 81 | Ht 65.0 in | Wt 159.2 lb

## 2016-12-29 DIAGNOSIS — E1165 Type 2 diabetes mellitus with hyperglycemia: Secondary | ICD-10-CM

## 2016-12-29 NOTE — Patient Instructions (Signed)
Check blood sugars on waking up  2-3/7  Also check blood sugars about 2 hours after a meal and do this after different meals by rotation  Recommended blood sugar levels on waking up is 90-130 and about 2 hours after meal is 130-160  Please bring your blood sugar monitor to each visit, thank you   

## 2017-01-08 DIAGNOSIS — H348112 Central retinal vein occlusion, right eye, stable: Secondary | ICD-10-CM | POA: Diagnosis not present

## 2017-01-08 DIAGNOSIS — H35351 Cystoid macular degeneration, right eye: Secondary | ICD-10-CM | POA: Diagnosis not present

## 2017-01-08 DIAGNOSIS — E119 Type 2 diabetes mellitus without complications: Secondary | ICD-10-CM | POA: Diagnosis not present

## 2017-01-08 DIAGNOSIS — H2513 Age-related nuclear cataract, bilateral: Secondary | ICD-10-CM | POA: Diagnosis not present

## 2017-01-15 DIAGNOSIS — E119 Type 2 diabetes mellitus without complications: Secondary | ICD-10-CM | POA: Diagnosis not present

## 2017-01-15 DIAGNOSIS — E785 Hyperlipidemia, unspecified: Secondary | ICD-10-CM | POA: Diagnosis not present

## 2017-01-15 DIAGNOSIS — I10 Essential (primary) hypertension: Secondary | ICD-10-CM | POA: Diagnosis not present

## 2017-01-24 ENCOUNTER — Other Ambulatory Visit: Payer: Self-pay | Admitting: Endocrinology

## 2017-02-09 ENCOUNTER — Other Ambulatory Visit: Payer: Self-pay | Admitting: Endocrinology

## 2017-03-07 DIAGNOSIS — M25532 Pain in left wrist: Secondary | ICD-10-CM | POA: Diagnosis not present

## 2017-03-16 DIAGNOSIS — M25561 Pain in right knee: Secondary | ICD-10-CM | POA: Diagnosis not present

## 2017-04-25 ENCOUNTER — Other Ambulatory Visit (INDEPENDENT_AMBULATORY_CARE_PROVIDER_SITE_OTHER): Payer: BLUE CROSS/BLUE SHIELD

## 2017-04-25 DIAGNOSIS — E1165 Type 2 diabetes mellitus with hyperglycemia: Secondary | ICD-10-CM

## 2017-04-25 LAB — COMPREHENSIVE METABOLIC PANEL
ALT: 15 U/L (ref 0–35)
AST: 22 U/L (ref 0–37)
Albumin: 4.2 g/dL (ref 3.5–5.2)
Alkaline Phosphatase: 80 U/L (ref 39–117)
BUN: 18 mg/dL (ref 6–23)
CO2: 30 mEq/L (ref 19–32)
Calcium: 9.9 mg/dL (ref 8.4–10.5)
Chloride: 100 mEq/L (ref 96–112)
Creatinine, Ser: 1.12 mg/dL (ref 0.40–1.20)
GFR: 62.39 mL/min (ref >60.00)
Glucose, Bld: 107 mg/dL — ABNORMAL HIGH (ref 70–99)
Potassium: 4.3 mEq/L (ref 3.5–5.1)
Sodium: 138 mEq/L (ref 135–145)
Total Bilirubin: 0.4 mg/dL (ref 0.2–1.2)
Total Protein: 7.6 g/dL (ref 6.0–8.3)

## 2017-04-25 LAB — MICROALBUMIN / CREATININE URINE RATIO
Creatinine,U: 97.5 mg/dL
Microalb Creat Ratio: 0.7 mg/g (ref 0.0–30.0)
Microalb, Ur: <0.7 mg/dL (ref 0.0–1.9)

## 2017-04-25 LAB — HEMOGLOBIN A1C: Hgb A1c MFr Bld: 7 % — ABNORMAL HIGH (ref 4.6–6.5)

## 2017-04-27 ENCOUNTER — Other Ambulatory Visit: Payer: BLUE CROSS/BLUE SHIELD

## 2017-04-30 ENCOUNTER — Encounter: Payer: Self-pay | Admitting: Endocrinology

## 2017-04-30 ENCOUNTER — Ambulatory Visit: Payer: BLUE CROSS/BLUE SHIELD | Admitting: Endocrinology

## 2017-04-30 VITALS — BP 140/60 | HR 70 | Ht 65.0 in | Wt 165.0 lb

## 2017-04-30 DIAGNOSIS — E063 Autoimmune thyroiditis: Secondary | ICD-10-CM | POA: Diagnosis not present

## 2017-04-30 DIAGNOSIS — E119 Type 2 diabetes mellitus without complications: Secondary | ICD-10-CM | POA: Diagnosis not present

## 2017-04-30 NOTE — Progress Notes (Signed)
Patient ID: Allison Matthews, female   DOB: [birth year], 67 y.o.   MRN: [id number]   Reason for Appointment:  follow-up   History of Present Illness   Diagnosis: Type 2 DIABETES MELITUS, date of diagnosis:  2005    She was initially treated with insulin and subsequently was transitioned to [place] XR with excellent control Previously had upper normal A1c results and had been fairly compliant with diet and exercise Since early 2014 her A1c levels had been relatively higher  Recent history:   Oral hypoglycemic drugs: Kombiglyze XR 08/998, 1 tablet daily         Her A1c is again 7% compared to 7.3 in 09/2016  Current management, blood sugar patterns and problems identified:  She has been on the 08/998 Kombiglyze XR dosage since she had difficulty with 2 tablets of the 2.08/998 regimen  She takes this at lunchtime usually  Her blood sugars at home have been fairly good overall  She has only one significantly high reading of 162 after lunch which is after main meal recently  FASTING blood sugars averaging about 110 and relatively better  Still not doing enough readings after lunch meals and usually with a light evening meal her blood sugars do not go up  Because of various joint pain she has not done much walking  However her weight is back up about 6 pounds     Side effects from medications: None  Monitors blood glucose:  every other day .    Glucometer: Contour        Blood Glucose readings from download   Show fasting average about 110, nonfasting range 84-168 with overall AVERAGE 123 from 10 readings         Meals: 3 meals per day. Oatmeal in am, evening meal maybe a sandwich Physical activity: exercise: None now, previously was walking steps and doing Some walking outside             Dietician visit: Most recent: 2009             Wt Readings from Last 3 Encounters:  04/30/17 165 lb (74.8 kg)  12/29/16 159 lb 3.2 oz (72.2 kg)  09/22/16 163 lb 12.8  oz (74.3 kg)   Diabetes Labs:  Lab Results  Component Value Date   HGBA1C 7.0 (H) 04/25/2017   HGBA1C 7.0 (H) 12/27/2016   HGBA1C 7.3 (H) 09/19/2016   Lab Results  Component Value Date   MICROALBUR <0.7 04/25/2017   LDLCALC 45 12/27/2016   CREATININE 1.12 04/25/2017    Other problems: See review of systems  Allergies as of 04/30/2017   No Known Allergies     Medication List        Accurate as of 04/30/17  4:03 PM. Always use your most recent med list.          BAYER CONTOUR NEXT MONITOR w/Device Kit Use to check blood sugar 2 times per day dx code E11.65   BAYER CONTOUR NEXT TEST test strip Generic drug:  glucose blood USE AS INSTRUCTED TO CHECK BLOOD SUGAR 2 TIMES PER DAY DX CODEE11.65   BAYER MICROLET LANCETS lancets Use as instructed to check blood sugar 2 times per day dx code E11.65   colchicine 0.6 MG tablet Reported on 08/18/2015   fluticasone 0.05 % cream Commonly known as:  CUTIVATE Reported on 08/18/2015   KOMBIGLYZE XR 08-998 MG Tb24 Generic drug:  Saxagliptin-Metformin Take 1 tablet by mouth daily.   KOMBIGLYZE XR 08-998  MG Tb24 Generic drug:  Saxagliptin-Metformin TAKE 1 TABLET EVERY DAY   levothyroxine 50 MCG tablet Commonly known as:  SYNTHROID, LEVOTHROID 50 mcg.   meloxicam 7.5 MG tablet Commonly known as:  MOBIC TAKE 1 TABLET BY MOUTH EVERY DAY   NONFORMULARY OR COMPOUNDED [place] compound:  Antiinflammatory cream - Diclofenac 3%, Baclofen 2%, Cyclobenzaprine 2%, Lidocaine 2%, dispense 120gram, apply 1-2 grams to affected area 3-4 times daily, +2refills.   NONFORMULARY OR COMPOUNDED ITEM [employer] compound:  Combo Pain #1 - Baclofen 3%, Gabapentin *%, Aripiprazole 0.5%, Ketoralac 8%, Lidocaine 5%, dispense 240 grams, appy 1-2 pumps to focal area 3-4 times daily, +3refills.   rosuvastatin 20 MG tablet Commonly known as:  CRESTOR TAKE 1 TABLET BY MOUTH DAILY.   valsartan-hydrochlorothiazide 160-25 MG tablet Commonly  known as:  DIOVAN-HCT       Allergies: No Known Allergies  No past medical history on file.  Past Surgical History:  Procedure Laterality Date  . ABDOMINAL HYSTERECTOMY      Family History  Problem Relation Age of Onset  . Diabetes Mother     Social History:  reports that  has never smoked. she has never used smokeless tobacco. Her alcohol and drug histories are not on file.  Review of Systems:  She has had diabetic retinopathy treated with laser and follows with Dr. Zadie Rhine,  Stable, last Exam 2/18   HYPERTENSION:  she has had fairly good control of her blood pressure with Diovan HCT, half tablet daily  History of mild renal insufficiency: Creatinine is Normal now    Lab Results  Component Value Date   CREATININE 1.12 04/25/2017    HYPERLIPIDEMIA: The lipid abnormality consists of elevated LDL.  She has good control of LDL with aching simvastatin regularly as of 12/2016  Lab Results  Component Value Date   CHOL 138 12/27/2016   HDL 76.00 12/27/2016   LDLCALC 45 12/27/2016   LDLDIRECT 144.0 02/18/2016   TRIG 84.0 12/27/2016   CHOLHDL 2 12/27/2016    Mild hypothyroidism: Adequately controlled with 50 g levothyroxine   Lab Results  Component Value Date   FREET4 0.89 10/09/2013   TSH 2.22 12/27/2016   TSH 2.12 02/18/2016   TSH 0.85 02/15/2015   Diabetic foot exam in 2/18 showed normal monofilament sensation in the toes and plantar surfaces, no skin lesions or ulcers on the feet and normal pedal pulses      Examination:   BP 140/60   Pulse 70   Ht _0  (1.651 m)   Wt 165 lb (74.8 kg)   SpO2 99%   BMI 27.46 kg/m   Body mass index is 27.46 kg/m.     ASSESSMENT/ PLAN:   Diabetes type 2  See history of present illness for detailed discussion of current diabetes management, blood sugar patterns and problems identified  A1c is about 7% although in 2016 had been 6.7 She has gained weight recently and has not been exercising as much  also  Currently has only a few blood sugars for evaluation and may be having some high postprandial readings especially after lunch when she does not check enough  Joint pains: She will discuss with PCP, this is a recent problem And unlikely to be related to any of her medications   Blood pressure controlled with  Diovan HCT       Elayne Snare 04/30/2017, 4:03 PM   Lab on 04/25/2017  Component Date Value Ref Range Status  . Microalb, Ur 04/25/2017 <0.7  0.0 - 1.9 mg/dL Final  . Creatinine,U 04/25/2017 97.5  mg/dL Final  . Microalb Creat Ratio 04/25/2017 0.7  0.0 - 30.0 mg/g Final  . Sodium 04/25/2017 138  135 - 145 mEq/L Final  . Potassium 04/25/2017 4.3  3.5 - 5.1 mEq/L Final  . Chloride 04/25/2017 100  96 - 112 mEq/L Final  . CO2 04/25/2017 30  19 - 32 mEq/L Final  . Glucose, Bld 04/25/2017 107* 70 - 99 mg/dL Final  . BUN 04/25/2017 18  6 - 23 mg/dL Final  . Creatinine, Ser 04/25/2017 1.12  0.40 - 1.20 mg/dL Final  . Total Bilirubin 04/25/2017 0.4  0.2 - 1.2 mg/dL Final  . Alkaline Phosphatase 04/25/2017 80  39 - 117 U/L Final  . AST 04/25/2017 22  0 - 37 U/L Final  . ALT 04/25/2017 15  0 - 35 U/L Final  . Total Protein 04/25/2017 7.6  6.0 - 8.3 g/dL Final  . Albumin 04/25/2017 4.2  3.5 - 5.2 g/dL Final  . Calcium 04/25/2017 9.9  8.4 - 10.5 mg/dL Final  . GFR 04/25/2017 62.39  >60.00 mL/min Final  . Hgb A1c MFr Bld 04/25/2017 7.0* 4.6 - 6.5 % Final   Glycemic Control Guidelines for People with Diabetes:Non Diabetic:  <6%Goal of Therapy: <7%Additional Action Suggested:  >8%

## 2017-04-30 NOTE — Patient Instructions (Signed)
Check blood sugars on waking up 1-2/7 days    Also check blood sugars about 2 hours after a meal and do this after different meals by rotation  Recommended blood sugar levels on waking up is 90-130 and about 2 hours after meal is 130-160  Please bring your blood sugar monitor to each visit, thank you

## 2017-05-15 DIAGNOSIS — E785 Hyperlipidemia, unspecified: Secondary | ICD-10-CM | POA: Diagnosis not present

## 2017-05-15 DIAGNOSIS — I10 Essential (primary) hypertension: Secondary | ICD-10-CM | POA: Diagnosis not present

## 2017-05-15 DIAGNOSIS — E119 Type 2 diabetes mellitus without complications: Secondary | ICD-10-CM | POA: Diagnosis not present

## 2017-05-17 DIAGNOSIS — I10 Essential (primary) hypertension: Secondary | ICD-10-CM | POA: Diagnosis not present

## 2017-05-17 DIAGNOSIS — E785 Hyperlipidemia, unspecified: Secondary | ICD-10-CM | POA: Diagnosis not present

## 2017-05-17 DIAGNOSIS — E119 Type 2 diabetes mellitus without complications: Secondary | ICD-10-CM | POA: Diagnosis not present

## 2017-05-23 DIAGNOSIS — Z01419 Encounter for gynecological examination (general) (routine) without abnormal findings: Secondary | ICD-10-CM | POA: Diagnosis not present

## 2017-05-23 DIAGNOSIS — Z1231 Encounter for screening mammogram for malignant neoplasm of breast: Secondary | ICD-10-CM | POA: Diagnosis not present

## 2017-05-23 DIAGNOSIS — Z6828 Body mass index (BMI) 28.0-28.9, adult: Secondary | ICD-10-CM | POA: Diagnosis not present

## 2017-07-12 IMAGING — CR DG KNEE 1-2V*L*
2 series · 2 of 2 positions shown · non-contrast
Comparison: None.

CLINICAL DATA: Left knee tenderness for 2 days.

EXAM:
LEFT KNEE - 1-2 VIEW

[t knee ap left]
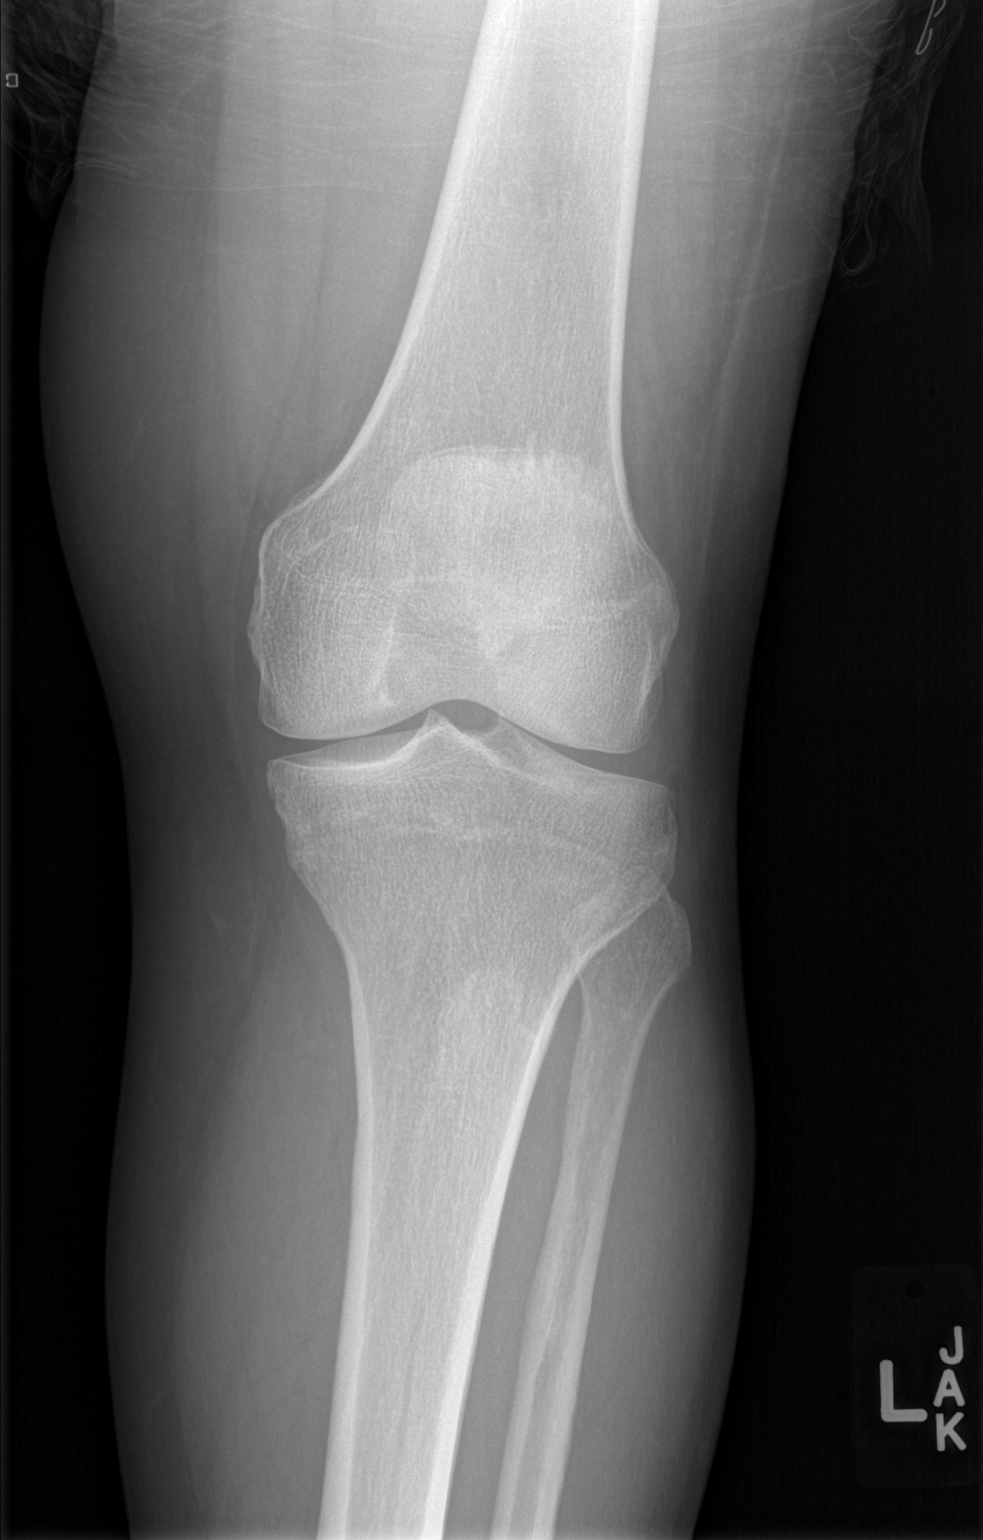

[t knee lat left]
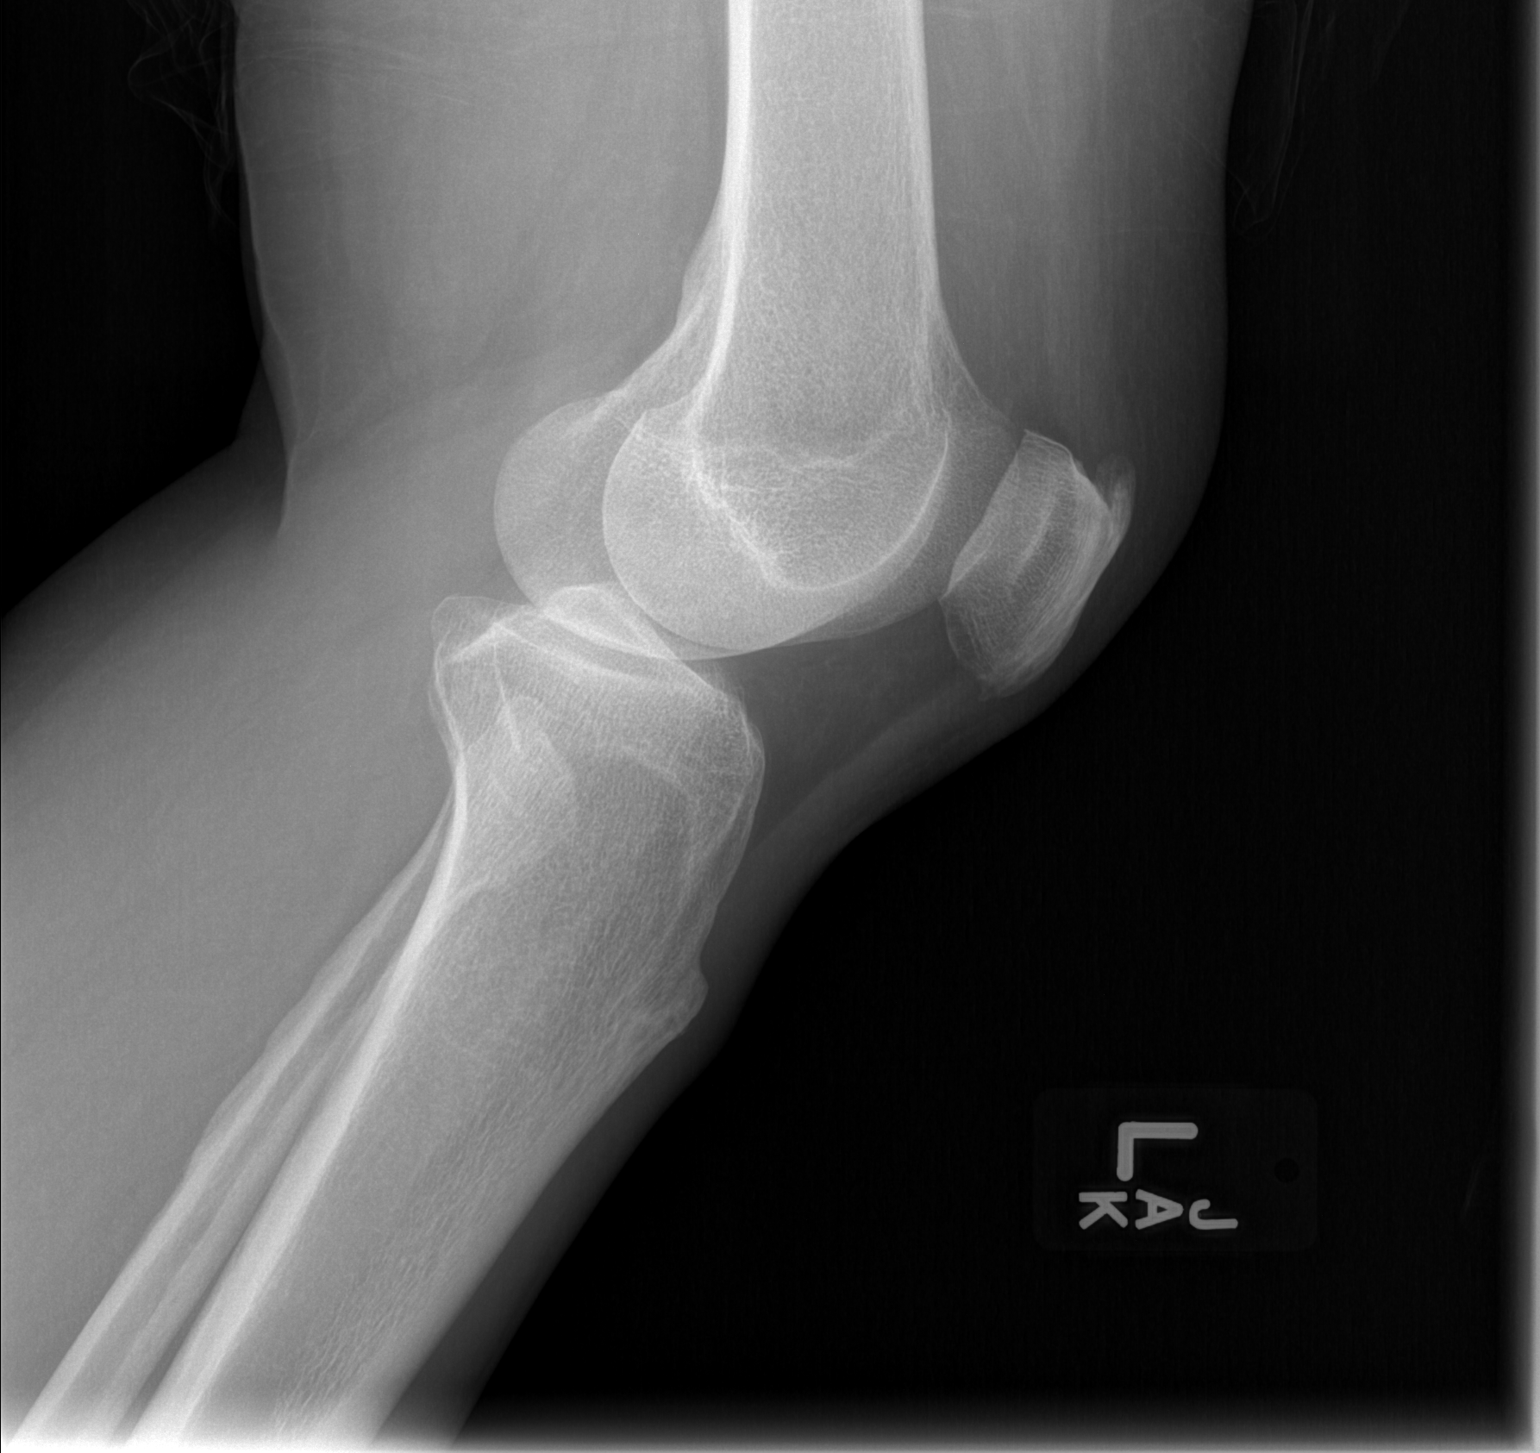

[2 of 2 positions shown; findings below may reference images not displayed]

FINDINGS: No evidence of fracture, dislocation, or joint effusion. Minimal
osteophytosis of the tibial spines is noted. No significant joint
space narrowing. There are superior and inferior patellar
enthesophytes. Soft tissues are unremarkable.
IMPRESSION: No evidence of fracture or dislocation of the left knee.

Minimal osteophytosis of the tibial spines.

## 2017-08-24 ENCOUNTER — Other Ambulatory Visit: Payer: BLUE CROSS/BLUE SHIELD

## 2017-08-28 ENCOUNTER — Ambulatory Visit: Payer: BLUE CROSS/BLUE SHIELD | Admitting: Endocrinology

## 2017-09-05 DIAGNOSIS — F4320 Adjustment disorder, unspecified: Secondary | ICD-10-CM | POA: Diagnosis not present

## 2017-09-05 DIAGNOSIS — L259 Unspecified contact dermatitis, unspecified cause: Secondary | ICD-10-CM | POA: Diagnosis not present

## 2017-09-11 DIAGNOSIS — E119 Type 2 diabetes mellitus without complications: Secondary | ICD-10-CM | POA: Diagnosis not present

## 2017-09-11 DIAGNOSIS — I10 Essential (primary) hypertension: Secondary | ICD-10-CM | POA: Diagnosis not present

## 2017-09-11 DIAGNOSIS — E785 Hyperlipidemia, unspecified: Secondary | ICD-10-CM | POA: Diagnosis not present

## 2017-09-13 DIAGNOSIS — I10 Essential (primary) hypertension: Secondary | ICD-10-CM | POA: Diagnosis not present

## 2017-10-07 ENCOUNTER — Other Ambulatory Visit: Payer: Self-pay | Admitting: Endocrinology

## 2017-10-08 NOTE — Telephone Encounter (Signed)
Pt last seen on 04/30/17 with no upcoming appointments. Okay to refill?

## 2017-10-08 NOTE — Telephone Encounter (Signed)
Needs 30-day prescription only with need to make follow-up appointment with labs

## 2017-10-24 DIAGNOSIS — Z0000 Encounter for general adult medical examination without abnormal findings: Secondary | ICD-10-CM | POA: Diagnosis not present

## 2017-10-24 DIAGNOSIS — E119 Type 2 diabetes mellitus without complications: Secondary | ICD-10-CM | POA: Diagnosis not present

## 2017-10-24 DIAGNOSIS — I10 Essential (primary) hypertension: Secondary | ICD-10-CM | POA: Diagnosis not present

## 2017-10-24 DIAGNOSIS — E11311 Type 2 diabetes mellitus with unspecified diabetic retinopathy with macular edema: Secondary | ICD-10-CM | POA: Diagnosis not present

## 2017-10-24 DIAGNOSIS — M130 Polyarthritis, unspecified: Secondary | ICD-10-CM | POA: Diagnosis not present

## 2017-11-08 ENCOUNTER — Other Ambulatory Visit: Payer: Self-pay | Admitting: Endocrinology

## 2017-11-27 ENCOUNTER — Other Ambulatory Visit: Payer: Self-pay

## 2017-11-27 MED ORDER — SAXAGLIPTIN-METFORMIN ER 5-1000 MG PO TB24: 1.0000 | ORAL_TABLET | Freq: Every day | ORAL | 0 refills | Status: DC

## 2017-12-29 ENCOUNTER — Other Ambulatory Visit: Payer: Self-pay | Admitting: Endocrinology

## 2018-01-02 DIAGNOSIS — G4733 Obstructive sleep apnea (adult) (pediatric): Secondary | ICD-10-CM | POA: Diagnosis not present

## 2018-01-08 DIAGNOSIS — H2513 Age-related nuclear cataract, bilateral: Secondary | ICD-10-CM | POA: Diagnosis not present

## 2018-01-08 DIAGNOSIS — H35351 Cystoid macular degeneration, right eye: Secondary | ICD-10-CM | POA: Diagnosis not present

## 2018-01-08 DIAGNOSIS — H348112 Central retinal vein occlusion, right eye, stable: Secondary | ICD-10-CM | POA: Diagnosis not present

## 2018-01-08 DIAGNOSIS — E119 Type 2 diabetes mellitus without complications: Secondary | ICD-10-CM | POA: Diagnosis not present

## 2018-01-09 ENCOUNTER — Other Ambulatory Visit: Payer: Self-pay | Admitting: Endocrinology

## 2018-01-09 DIAGNOSIS — E119 Type 2 diabetes mellitus without complications: Secondary | ICD-10-CM | POA: Diagnosis not present

## 2018-01-09 DIAGNOSIS — E785 Hyperlipidemia, unspecified: Secondary | ICD-10-CM | POA: Diagnosis not present

## 2018-01-09 DIAGNOSIS — I10 Essential (primary) hypertension: Secondary | ICD-10-CM | POA: Diagnosis not present

## 2018-01-09 DIAGNOSIS — M130 Polyarthritis, unspecified: Secondary | ICD-10-CM | POA: Diagnosis not present

## 2018-01-09 MED ORDER — KOMBIGLYZE XR 5-1000 MG PO TB24: 1.0000 | ORAL_TABLET | Freq: Every day | ORAL | 4 refills | Status: DC

## 2018-01-09 NOTE — Telephone Encounter (Signed)
Rx Kombiglyze XR 08-998 mg disp-30, R-4 sent to [facility]

## 2018-01-14 DIAGNOSIS — E785 Hyperlipidemia, unspecified: Secondary | ICD-10-CM | POA: Diagnosis not present

## 2018-01-14 DIAGNOSIS — D509 Iron deficiency anemia, unspecified: Secondary | ICD-10-CM | POA: Diagnosis not present

## 2018-01-14 DIAGNOSIS — I10 Essential (primary) hypertension: Secondary | ICD-10-CM | POA: Diagnosis not present

## 2018-01-14 DIAGNOSIS — E119 Type 2 diabetes mellitus without complications: Secondary | ICD-10-CM | POA: Diagnosis not present

## 2018-01-15 DIAGNOSIS — Z800 Family history of malignant neoplasm of digestive organs: Secondary | ICD-10-CM | POA: Diagnosis not present

## 2018-01-15 DIAGNOSIS — D509 Iron deficiency anemia, unspecified: Secondary | ICD-10-CM | POA: Diagnosis not present

## 2018-01-15 DIAGNOSIS — R634 Abnormal weight loss: Secondary | ICD-10-CM | POA: Diagnosis not present

## 2018-01-15 DIAGNOSIS — Z1211 Encounter for screening for malignant neoplasm of colon: Secondary | ICD-10-CM | POA: Diagnosis not present

## 2018-02-04 DIAGNOSIS — K2970 Gastritis, unspecified, without bleeding: Secondary | ICD-10-CM | POA: Diagnosis not present

## 2018-02-04 DIAGNOSIS — Z800 Family history of malignant neoplasm of digestive organs: Secondary | ICD-10-CM | POA: Diagnosis not present

## 2018-02-04 DIAGNOSIS — D509 Iron deficiency anemia, unspecified: Secondary | ICD-10-CM | POA: Diagnosis not present

## 2018-02-04 DIAGNOSIS — K319 Disease of stomach and duodenum, unspecified: Secondary | ICD-10-CM | POA: Diagnosis not present

## 2018-02-04 DIAGNOSIS — R635 Abnormal weight gain: Secondary | ICD-10-CM | POA: Diagnosis not present

## 2018-02-04 DIAGNOSIS — R634 Abnormal weight loss: Secondary | ICD-10-CM | POA: Diagnosis not present

## 2018-02-04 DIAGNOSIS — Z1211 Encounter for screening for malignant neoplasm of colon: Secondary | ICD-10-CM | POA: Diagnosis not present

## 2018-02-14 DIAGNOSIS — E119 Type 2 diabetes mellitus without complications: Secondary | ICD-10-CM | POA: Diagnosis not present

## 2018-02-14 DIAGNOSIS — I10 Essential (primary) hypertension: Secondary | ICD-10-CM | POA: Diagnosis not present

## 2018-02-14 DIAGNOSIS — Z6827 Body mass index (BMI) 27.0-27.9, adult: Secondary | ICD-10-CM | POA: Diagnosis not present

## 2018-02-14 DIAGNOSIS — M1000 Idiopathic gout, unspecified site: Secondary | ICD-10-CM | POA: Diagnosis not present

## 2018-02-21 DIAGNOSIS — R1033 Periumbilical pain: Secondary | ICD-10-CM | POA: Diagnosis not present

## 2018-02-21 DIAGNOSIS — D509 Iron deficiency anemia, unspecified: Secondary | ICD-10-CM | POA: Diagnosis not present

## 2018-02-21 DIAGNOSIS — Z800 Family history of malignant neoplasm of digestive organs: Secondary | ICD-10-CM | POA: Diagnosis not present

## 2018-02-21 DIAGNOSIS — K5904 Chronic idiopathic constipation: Secondary | ICD-10-CM | POA: Diagnosis not present

## 2018-03-04 ENCOUNTER — Encounter (HOSPITAL_COMMUNITY)
Admission: RE | Disposition: A | Discharge status: Home / Self Care | Payer: Self-pay | Source: Ambulatory Visit | Attending: Gastroenterology

## 2018-03-04 ENCOUNTER — Ambulatory Visit (HOSPITAL_COMMUNITY)
Admission: RE | Admit: 2018-03-04 | Discharge: 2018-03-04 | Disposition: A | Discharge status: Home / Self Care | Payer: BLUE CROSS/BLUE SHIELD | Source: Ambulatory Visit | Attending: Gastroenterology | Admitting: Gastroenterology

## 2018-03-04 DIAGNOSIS — D649 Anemia, unspecified: Secondary | ICD-10-CM | POA: Insufficient documentation

## 2018-03-04 HISTORY — PX: GIVENS CAPSULE STUDY: SHX5432

## 2018-03-04 SURGERY — IMAGING PROCEDURE, GI TRACT, INTRALUMINAL, VIA CAPSULE

## 2018-03-04 SURGICAL SUPPLY — 1 items: TOWEL COTTON PACK 4EA (MISCELLANEOUS) ×4 IMPLANT

## 2018-03-04 NOTE — Progress Notes (Signed)
Patient admitted to Endoscopy for outpatient capsule study. Patient swallowed capsule @ 0800 with no issues. Patient is aware to return capsule equipment tomorrow.

## 2018-03-05 ENCOUNTER — Encounter (HOSPITAL_COMMUNITY): Payer: Self-pay | Admitting: Gastroenterology

## 2018-03-08 DIAGNOSIS — D509 Iron deficiency anemia, unspecified: Secondary | ICD-10-CM | POA: Diagnosis not present

## 2018-04-30 DIAGNOSIS — G4733 Obstructive sleep apnea (adult) (pediatric): Secondary | ICD-10-CM | POA: Diagnosis not present

## 2018-06-13 DIAGNOSIS — E119 Type 2 diabetes mellitus without complications: Secondary | ICD-10-CM | POA: Diagnosis not present

## 2018-06-13 DIAGNOSIS — I10 Essential (primary) hypertension: Secondary | ICD-10-CM | POA: Diagnosis not present

## 2018-06-17 DIAGNOSIS — E1169 Type 2 diabetes mellitus with other specified complication: Secondary | ICD-10-CM | POA: Diagnosis not present

## 2018-06-17 DIAGNOSIS — I10 Essential (primary) hypertension: Secondary | ICD-10-CM | POA: Diagnosis not present

## 2018-06-17 DIAGNOSIS — R21 Rash and other nonspecific skin eruption: Secondary | ICD-10-CM | POA: Diagnosis not present

## 2018-06-17 DIAGNOSIS — E785 Hyperlipidemia, unspecified: Secondary | ICD-10-CM | POA: Diagnosis not present

## 2018-06-18 DIAGNOSIS — Z6828 Body mass index (BMI) 28.0-28.9, adult: Secondary | ICD-10-CM | POA: Diagnosis not present

## 2018-06-18 DIAGNOSIS — Z1231 Encounter for screening mammogram for malignant neoplasm of breast: Secondary | ICD-10-CM | POA: Diagnosis not present

## 2018-06-18 DIAGNOSIS — Z01419 Encounter for gynecological examination (general) (routine) without abnormal findings: Secondary | ICD-10-CM | POA: Diagnosis not present

## 2018-07-11 ENCOUNTER — Other Ambulatory Visit: Payer: Self-pay | Admitting: Endocrinology

## 2018-07-18 DIAGNOSIS — R21 Rash and other nonspecific skin eruption: Secondary | ICD-10-CM | POA: Diagnosis not present

## 2018-07-18 DIAGNOSIS — E785 Hyperlipidemia, unspecified: Secondary | ICD-10-CM | POA: Diagnosis not present

## 2018-07-18 DIAGNOSIS — E1169 Type 2 diabetes mellitus with other specified complication: Secondary | ICD-10-CM | POA: Diagnosis not present

## 2018-07-25 DIAGNOSIS — R21 Rash and other nonspecific skin eruption: Secondary | ICD-10-CM | POA: Diagnosis not present

## 2018-08-22 ENCOUNTER — Other Ambulatory Visit: Payer: Self-pay | Admitting: Endocrinology

## 2018-09-09 DIAGNOSIS — T7840XA Allergy, unspecified, initial encounter: Secondary | ICD-10-CM | POA: Diagnosis not present

## 2018-09-13 DIAGNOSIS — T7840XD Allergy, unspecified, subsequent encounter: Secondary | ICD-10-CM | POA: Diagnosis not present

## 2018-09-23 ENCOUNTER — Other Ambulatory Visit: Payer: Self-pay | Admitting: Endocrinology

## 2018-09-30 DIAGNOSIS — R87615 Unsatisfactory cytologic smear of cervix: Secondary | ICD-10-CM | POA: Diagnosis not present

## 2018-09-30 DIAGNOSIS — G4733 Obstructive sleep apnea (adult) (pediatric): Secondary | ICD-10-CM | POA: Diagnosis not present

## 2018-10-10 DIAGNOSIS — E785 Hyperlipidemia, unspecified: Secondary | ICD-10-CM | POA: Diagnosis not present

## 2018-10-10 DIAGNOSIS — E119 Type 2 diabetes mellitus without complications: Secondary | ICD-10-CM | POA: Diagnosis not present

## 2018-10-10 DIAGNOSIS — I10 Essential (primary) hypertension: Secondary | ICD-10-CM | POA: Diagnosis not present

## 2018-10-10 DIAGNOSIS — E1169 Type 2 diabetes mellitus with other specified complication: Secondary | ICD-10-CM | POA: Diagnosis not present

## 2018-10-17 DIAGNOSIS — M130 Polyarthritis, unspecified: Secondary | ICD-10-CM | POA: Diagnosis not present

## 2018-10-17 DIAGNOSIS — L259 Unspecified contact dermatitis, unspecified cause: Secondary | ICD-10-CM | POA: Diagnosis not present

## 2018-10-17 DIAGNOSIS — I10 Essential (primary) hypertension: Secondary | ICD-10-CM | POA: Diagnosis not present

## 2018-10-17 DIAGNOSIS — E1169 Type 2 diabetes mellitus with other specified complication: Secondary | ICD-10-CM | POA: Diagnosis not present

## 2018-10-21 DIAGNOSIS — L299 Pruritus, unspecified: Secondary | ICD-10-CM | POA: Diagnosis not present

## 2018-10-21 DIAGNOSIS — R600 Localized edema: Secondary | ICD-10-CM | POA: Diagnosis not present

## 2018-10-21 DIAGNOSIS — R21 Rash and other nonspecific skin eruption: Secondary | ICD-10-CM | POA: Diagnosis not present

## 2018-10-24 DIAGNOSIS — Z0131 Encounter for examination of blood pressure with abnormal findings: Secondary | ICD-10-CM | POA: Diagnosis not present

## 2018-10-31 DIAGNOSIS — H02846 Edema of left eye, unspecified eyelid: Secondary | ICD-10-CM | POA: Diagnosis not present

## 2018-10-31 DIAGNOSIS — H35351 Cystoid macular degeneration, right eye: Secondary | ICD-10-CM | POA: Diagnosis not present

## 2018-10-31 DIAGNOSIS — H348112 Central retinal vein occlusion, right eye, stable: Secondary | ICD-10-CM | POA: Diagnosis not present

## 2018-10-31 DIAGNOSIS — E119 Type 2 diabetes mellitus without complications: Secondary | ICD-10-CM | POA: Diagnosis not present

## 2018-11-06 DIAGNOSIS — L2089 Other atopic dermatitis: Secondary | ICD-10-CM | POA: Diagnosis not present

## 2018-11-06 DIAGNOSIS — I10 Essential (primary) hypertension: Secondary | ICD-10-CM | POA: Diagnosis not present

## 2018-11-06 DIAGNOSIS — E1169 Type 2 diabetes mellitus with other specified complication: Secondary | ICD-10-CM | POA: Diagnosis not present

## 2018-11-21 DIAGNOSIS — E119 Type 2 diabetes mellitus without complications: Secondary | ICD-10-CM | POA: Diagnosis not present

## 2018-11-27 DIAGNOSIS — E1169 Type 2 diabetes mellitus with other specified complication: Secondary | ICD-10-CM | POA: Diagnosis not present

## 2018-11-27 DIAGNOSIS — T7840XA Allergy, unspecified, initial encounter: Secondary | ICD-10-CM | POA: Diagnosis not present

## 2018-12-02 DIAGNOSIS — L259 Unspecified contact dermatitis, unspecified cause: Secondary | ICD-10-CM | POA: Diagnosis not present

## 2018-12-04 DIAGNOSIS — R600 Localized edema: Secondary | ICD-10-CM | POA: Diagnosis not present

## 2018-12-04 DIAGNOSIS — L259 Unspecified contact dermatitis, unspecified cause: Secondary | ICD-10-CM | POA: Diagnosis not present

## 2018-12-05 DIAGNOSIS — L309 Dermatitis, unspecified: Secondary | ICD-10-CM | POA: Diagnosis not present

## 2018-12-21 DIAGNOSIS — E785 Hyperlipidemia, unspecified: Secondary | ICD-10-CM | POA: Diagnosis not present

## 2018-12-21 DIAGNOSIS — R21 Rash and other nonspecific skin eruption: Secondary | ICD-10-CM | POA: Diagnosis not present

## 2018-12-21 DIAGNOSIS — E1169 Type 2 diabetes mellitus with other specified complication: Secondary | ICD-10-CM | POA: Diagnosis not present

## 2019-01-02 ENCOUNTER — Ambulatory Visit (INDEPENDENT_AMBULATORY_CARE_PROVIDER_SITE_OTHER): Payer: BC Managed Care – PPO | Admitting: Allergy and Immunology

## 2019-01-02 ENCOUNTER — Other Ambulatory Visit: Payer: Self-pay

## 2019-01-02 ENCOUNTER — Encounter: Payer: Self-pay | Admitting: Allergy and Immunology

## 2019-01-02 VITALS — BP 144/78 | HR 72 | Temp 98.0°F | Resp 20 | Ht 63.31 in | Wt 166.4 lb

## 2019-01-02 DIAGNOSIS — R238 Other skin changes: Secondary | ICD-10-CM | POA: Diagnosis not present

## 2019-01-02 DIAGNOSIS — T783XXD Angioneurotic edema, subsequent encounter: Secondary | ICD-10-CM | POA: Diagnosis not present

## 2019-01-02 DIAGNOSIS — T783XXA Angioneurotic edema, initial encounter: Secondary | ICD-10-CM | POA: Insufficient documentation

## 2019-01-02 DIAGNOSIS — T7840XA Allergy, unspecified, initial encounter: Secondary | ICD-10-CM | POA: Insufficient documentation

## 2019-01-02 DIAGNOSIS — T7840XD Allergy, unspecified, subsequent encounter: Secondary | ICD-10-CM | POA: Diagnosis not present

## 2019-01-02 MED ORDER — DESONIDE 0.05 % EX OINT: TOPICAL_OINTMENT | CUTANEOUS | 0 refills | Status: AC

## 2019-01-02 MED ORDER — MOMETASONE FUROATE 0.1 % EX OINT: TOPICAL_OINTMENT | Freq: Every day | CUTANEOUS | 3 refills | Status: AC

## 2019-01-02 NOTE — Assessment & Plan Note (Signed)
Unclear etiology.  Laboratory order form has been provided for plasma total porphyrins.  A prescription has been provided for mometasone 0.1% ointment sparingly to affected areas daily as needed.  This medication is not to be used on the face or neck.  A prescription has been provided for desonide 0.05% ointment sparingly to affected areas on the face or neck.  If fresh lesions emerge, proceed to dermatologist for biopsy.  If labs are negative, further evaluation by rheumatologist, Dr. Gavin Pound, may be warranted.

## 2019-01-02 NOTE — Assessment & Plan Note (Signed)
Unclear etiology.  Food allergen skin testing was negative today despite a positive histamine control.  The patient is not taking an ACE inhibitor. NSAIDs may exacerbate angioedema but in this case are not the underlying etiology as demonstrated by the fact that the patient has experienced angioedema in the absence of NSAIDs. There are no concomitant symptoms concerning for anaphylaxis or constitutional symptoms worrisome for an underlying malignancy.  Will order labs to rule out hereditary angioedema, acquired angioedema, urticaria associated angioedema, and other potential etiologies.   The following labs have been ordered: Tryptase, C4, C1 esterase inhibitor (quantitative and functional), C1q, factor XII, CBC, CMP, and galactose-alpha-1,3-galactose IgE level.  The patient will be notified with further recommendations after lab results have returned.  Instructions have been provided and discussed for H1/H2 receptor blockade with step-wise increase/decrease to find lowest effective dose.  Should significant symptoms recur or change in character, a  journal is to be kept recording any foods eaten, beverages consumed, medications taken, activities performed, and environmental conditions within a 6 hour period prior to the onset of symptoms. For any symptoms concerning for anaphylaxis, 911 is to be called immediately.

## 2019-01-02 NOTE — Progress Notes (Signed)
New Patient Note  RE: Allison Matthews MRN: [id number] DOB: 03/09/1951 Date of Office Visit: 01/02/2019  Referring provider: Lucianne Lei, MD Primary care provider: Lucianne Lei, MD  Chief Complaint: Angioedema and Rash   History of present illness: Allison Matthews is a 68 y.o. female seen today in consultation requested by Lucianne Lei, MD.  She is accompanied today by her daughter who assists with the history.  In March 2019 she began to develop a rash "some type of breakout."  The rash only involves the sun exposed areas of her distal upper extremities, ankles, and her neck.  The rash is described as pruritic and "red and blistery like."  She saw a dermatologist who ruled out allergic contact dermatitis with negative patch testing.  The dermatologist did not perform a biopsy because there were no fresh lesions at the time.  The rash has been episodic but clearly resolves with prednisone packs which she received in March, April, and May 2020.  The rash would return typically within 7 to 10 days of being off of the prednisone.  In July 2020 she developed right eyelid swelling which has persisted since that time.  She reports that she woke up one morning and "boom, there."  Her primary care physician took her off of all of her medications, however the eyelid swelling persisted, so medications were restarted.  She had not been on an ACE inhibitor.  She denies concomitant urticaria, cardiopulmonary symptoms, or GI symptoms.  She denies recurrent fevers and unexpected weight loss.  She does experience occasional mild night sweats, however this is not unusual for her.  She denies conjunctivitis and rhinitis.  She has no history of symptoms consistent with asthma or eczema.  Assessment and plan: Angioedema Unclear etiology.  Food allergen skin testing was negative today despite a positive histamine control.  The patient is not taking an ACE inhibitor. NSAIDs may exacerbate angioedema  but in this case are not the underlying etiology as demonstrated by the fact that the patient has experienced angioedema in the absence of NSAIDs. There are no concomitant symptoms concerning for anaphylaxis or constitutional symptoms worrisome for an underlying malignancy.  Will order labs to rule out hereditary angioedema, acquired angioedema, urticaria associated angioedema, and other potential etiologies.   The following labs have been ordered: Tryptase, C4, C1 esterase inhibitor (quantitative and functional), C1q, factor XII, CBC, CMP, and galactose-alpha-1,3-galactose IgE level.  The patient will be notified with further recommendations after lab results have returned.  Instructions have been provided and discussed for H1/H2 receptor blockade with step-wise increase/decrease to find lowest effective dose.  Should significant symptoms recur or change in character, a  journal is to be kept recording any foods eaten, beverages consumed, medications taken, activities performed, and environmental conditions within a 6 hour period prior to the onset of symptoms. For any symptoms concerning for anaphylaxis, 911 is to be called immediately.  Rash, vesicular Unclear etiology.  Laboratory order form has been provided for plasma total porphyrins.  A prescription has been provided for mometasone 0.1% ointment sparingly to affected areas daily as needed.  This medication is not to be used on the face or neck.  A prescription has been provided for desonide 0.05% ointment sparingly to affected areas on the face or neck.  If fresh lesions emerge, proceed to dermatologist for biopsy.  If labs are negative, further evaluation by rheumatologist, Dr. Gavin Pound, may be warranted.   Meds ordered this encounter  Medications  .  desonide (DESOWEN) 0.05 % ointment    Sig: Apply sparingly to affected areas on the face or neck    Dispense:  15 g    Refill:  0  . mometasone (ELOCON) 0.1 % ointment     Sig: Apply topically daily. Apply sparingly  to affected areas as needed below face and neck    Dispense:  45 g    Refill:  3    Diagnostics: Environmental skin testing: Negative despite a positive histamine control. Food allergen skin testing: Negative despite a positive histamine control.    Physical examination: Blood pressure (!) 144/78, pulse 72, temperature 98 F (36.7 C), temperature source Oral, resp. rate 20, height 5' 3.31" (1.608 m), weight 166 lb 6.4 oz (75.5 kg), SpO2 97 %.  General: Alert, interactive, in no acute distress. HEENT: TMs pearly gray, turbinates minimally edematous without discharge, post-pharynx erythematous. Bilateral periorbital edema. Neck: Supple without lymphadenopathy. Lungs: Clear to auscultation without wheezing, rhonchi or rales. CV: Normal S1, S2 without murmurs. Abdomen: Nondistended, nontender. Skin: Bilateral periorbital edema. Thickened, hyperpigmented patch on left forearm. Extremities:  No clubbing, cyanosis or edema. Neuro:   Grossly intact.  Review of systems:  Review of systems negative except as noted in HPI / PMHx or noted below: Review of Systems  Constitutional: Negative.   HENT: Negative.   Eyes: Negative.   Respiratory: Negative.   Cardiovascular: Negative.   Gastrointestinal: Negative.   Genitourinary: Negative.   Musculoskeletal: Negative.   Skin: Negative.   Neurological: Negative.   Endo/Heme/Allergies: Negative.   Psychiatric/Behavioral: Negative.     Past medical history:  Past Medical History:  Diagnosis Date  . Diabetes ([place]) 2005  . High blood pressure 1999    Past surgical history:  Past Surgical History:  Procedure Laterality Date  . ABDOMINAL HYSTERECTOMY    . GIVENS CAPSULE STUDY N/A 03/04/2018   Procedure: GIVENS CAPSULE STUDY;  Surgeon: Juanita Craver, MD;  Location: [facility] ENDOSCOPY;  Service: Endoscopy;  Laterality: N/A;    Family history: Family History  Problem Relation Age of Onset  .  Diabetes Mother   . Asthma Grandson   . Eczema Grandson   . Asthma Nephew   . Eczema Daughter     Social history: Social History   Socioeconomic History  . Marital status: Divorced    Spouse name: Not on file  . Number of children: Not on file  . Years of education: Not on file  . Highest education level: Not on file  Occupational History  . Not on file  Social Needs  . Financial resource strain: Not on file  . Food insecurity    Worry: Not on file    Inability: Not on file  . Transportation needs    Medical: Not on file    Non-medical: Not on file  Tobacco Use  . Smoking status: Never Smoker  . Smokeless tobacco: Never Used  Substance and Sexual Activity  . Alcohol use: Not on file  . Drug use: Not on file  . Sexual activity: Not on file  Lifestyle  . Physical activity    Days per week: Not on file    Minutes per session: Not on file  . Stress: Not on file  Relationships  . Social Herbalist on phone: Not on file    Gets together: Not on file    Attends religious service: Not on file    Active member of club or organization: Not on file  Attends meetings of clubs or organizations: Not on file    Relationship status: Not on file  . Intimate partner violence    Fear of current or ex partner: Not on file    Emotionally abused: Not on file    Physically abused: Not on file    Forced sexual activity: Not on file  Other Topics Concern  . Not on file  Social History Narrative  . Not on file   Environmental History: The patient lives in a 69 year old house with carpeting in the bedroom, gas heat, and central air.  There is mold/water damage in the home.  There are no pets in the home.  She is a non-smoker.  Allergies as of 01/02/2019   No Known Allergies     Medication List       Accurate as of January 02, 2019  1:33 PM. If you have any questions, ask your nurse or doctor.        STOP taking these medications   colchicine 0.6 MG tablet  Stopped by: Edmonia Lynch, MD   fluticasone 0.05 % cream Commonly known as: CUTIVATE Stopped by: Edmonia Lynch, MD   Kombiglyze XR 08-998 MG Tb24 Generic drug: Saxagliptin-Metformin Stopped by: Edmonia Lynch, MD   meloxicam 7.5 MG tablet Commonly known as: MOBIC Stopped by: Edmonia Lynch, MD   NONFORMULARY OR COMPOUNDED ITEM Stopped by: Edmonia Lynch, MD   NONFORMULARY OR COMPOUNDED ITEM Stopped by: Edmonia Lynch, MD   rosuvastatin 20 MG tablet Commonly known as: CRESTOR Stopped by: Edmonia Lynch, MD   valsartan-hydrochlorothiazide 160-25 MG tablet Commonly known as: DIOVAN-HCT Stopped by: Edmonia Lynch, MD     TAKE these medications   Bayer Contour Next Monitor w/Device Kit Use to check blood sugar 2 times per day dx code E11.65   Bayer Contour Next Test test strip Generic drug: glucose blood USE AS INSTRUCTED TO CHECK BLOOD SUGAR 2 TIMES PER DAY DX CODEE11.65   Bayer Microlet Lancets lancets Use as instructed to check blood sugar 2 times per day dx code E11.65   cetirizine 10 MG tablet Commonly known as: ZYRTEC Take 10 mg by mouth daily.   desonide 0.05 % ointment Commonly known as: DESOWEN Apply sparingly to affected areas on the face or neck Started by: Edmonia Lynch, MD   EPINEPHrine 0.3 mg/0.3 mL Soaj injection Commonly known as: EPI-PEN Inject into the muscle.   HumaLOG KwikPen 100 UNIT/ML KwikPen Generic drug: insulin lispro Inject into the skin.   hydrochlorothiazide 25 MG tablet Commonly known as: HYDRODIURIL Take 25 mg by mouth daily.   insulin aspart 100 UNIT/ML FlexPen Commonly known as: NOVOLOG Inject into the skin.   levothyroxine 50 MCG tablet Commonly known as: SYNTHROID 50 mcg daily.   loratadine 10 MG tablet Commonly known as: CLARITIN Take 10 mg by mouth 2 (two) times daily.   mometasone 0.1 % ointment Commonly known as: ELOCON Apply topically daily. Apply sparingly  to affected areas as needed  below face and neck Started by: Edmonia Lynch, MD   triamcinolone cream 0.1 % Commonly known as: KENALOG APPLY NECK DOWN 1 2 TIMES A DAY AS NEEDED FOR ITCHY RASH       Known medication allergies: No Known Allergies  I appreciate the opportunity to take part in Allison Matthews's care. Please do not hesitate to contact me with questions.  Sincerely,   R. Edgar Frisk, MD

## 2019-01-02 NOTE — Patient Instructions (Addendum)
Angioedema Unclear etiology.  Food allergen skin testing was negative today despite a positive histamine control.  The patient is not taking an ACE inhibitor. NSAIDs may exacerbate angioedema but in this case are not the underlying etiology as demonstrated by the fact that the patient has experienced angioedema in the absence of NSAIDs. There are no concomitant symptoms concerning for anaphylaxis or constitutional symptoms worrisome for an underlying malignancy.  Will order labs to rule out hereditary angioedema, acquired angioedema, urticaria associated angioedema, and other potential etiologies.   The following labs have been ordered: Tryptase, C4, C1 esterase inhibitor (quantitative and functional), C1q, factor XII, CBC, CMP, and galactose-alpha-1,3-galactose IgE level.  The patient will be notified with further recommendations after lab results have returned.  Instructions have been provided and discussed for H1/H2 receptor blockade with step-wise increase/decrease to find lowest effective dose.  Should significant symptoms recur or change in character, a  journal is to be kept recording any foods eaten, beverages consumed, medications taken, activities performed, and environmental conditions within a 6 hour period prior to the onset of symptoms. For any symptoms concerning for anaphylaxis, 911 is to be called immediately.  Rash, vesicular Unclear etiology.  Laboratory order form has been provided for plasma total porphyrins.  A prescription has been provided for mometasone 0.1% ointment sparingly to affected areas daily as needed.  This medication is not to be used on the face or neck.  A prescription has been provided for desonide 0.05% ointment sparingly to affected areas on the face or neck.  If fresh lesions emerge, proceed to dermatologist for biopsy.  If labs are negative, further evaluation by rheumatologist, Dr. Gavin Pound, may be warranted.   When lab results have returned  the patient will be called with further recommendations.

## 2019-01-10 ENCOUNTER — Telehealth: Payer: Self-pay | Admitting: Allergy and Immunology

## 2019-01-10 LAB — CBC WITH DIFFERENTIAL/PLATELET
Basophils Absolute: 0 10*3/uL (ref 0.0–0.2)
Basos: 1 %
EOS (ABSOLUTE): 0.1 10*3/uL (ref 0.0–0.4)
Eos: 1 %
Hematocrit: 37.7 % (ref 34.0–46.6)
Hemoglobin: 12.5 g/dL (ref 11.1–15.9)
Immature Grans (Abs): 0 10*3/uL (ref 0.0–0.1)
Immature Granulocytes: 0 %
Lymphocytes Absolute: 2.5 10*3/uL (ref 0.7–3.1)
Lymphs: 49 %
MCH: 27.8 pg (ref 26.6–33.0)
MCHC: 33.2 g/dL (ref 31.5–35.7)
MCV: 84 fL (ref 79–97)
Monocytes Absolute: 0.5 10*3/uL (ref 0.1–0.9)
Monocytes: 9 %
Neutrophils Absolute: 2 10*3/uL (ref 1.4–7.0)
Neutrophils: 40 %
Platelets: 233 10*3/uL (ref 150–450)
RBC: 4.49 x10E6/uL (ref 3.77–5.28)
RDW: 12.5 % (ref 11.7–15.4)
WBC: 5.1 10*3/uL (ref 3.4–10.8)

## 2019-01-10 LAB — C4 COMPLEMENT: Complement C4, Serum: 29 mg/dL (ref 14–44)

## 2019-01-10 LAB — COMPREHENSIVE METABOLIC PANEL
ALT: 18 IU/L (ref 0–32)
AST: 25 IU/L (ref 0–40)
Albumin/Globulin Ratio: 1.6 (ref 1.2–2.2)
Albumin: 4.6 g/dL (ref 3.8–4.8)
Alkaline Phosphatase: 103 IU/L (ref 39–117)
BUN/Creatinine Ratio: 11 — ABNORMAL LOW (ref 12–28)
BUN: 12 mg/dL (ref 8–27)
Bilirubin Total: 0.6 mg/dL (ref 0.0–1.2)
CO2: 26 mmol/L (ref 20–29)
Calcium: 10.2 mg/dL (ref 8.7–10.3)
Chloride: 98 mmol/L (ref 96–106)
Creatinine, Ser: 1.08 mg/dL — ABNORMAL HIGH (ref 0.57–1.00)
GFR calc Af Amer: 61 mL/min/{1.73_m2} (ref >59)
GFR calc non Af Amer: 53 mL/min/{1.73_m2} — ABNORMAL LOW (ref >59)
Globulin, Total: 2.8 g/dL (ref 1.5–4.5)
Glucose: 153 mg/dL — ABNORMAL HIGH (ref 65–99)
Potassium: 4.1 mmol/L (ref 3.5–5.2)
Sodium: 140 mmol/L (ref 134–144)
Total Protein: 7.4 g/dL (ref 6.0–8.5)

## 2019-01-10 LAB — ALPHA-GAL PANEL
Alpha Gal IgE*: <0.1 kU/L (ref <0.10)
Beef (Bos spp) IgE: <0.1 kU/L (ref <0.35)
Class Interpretation: 0
Class Interpretation: 0
Class Interpretation: 0
Lamb/Mutton (Ovis spp) IgE: <0.1 kU/L (ref <0.35)
Pork (Sus spp) IgE: <0.1 kU/L (ref <0.35)

## 2019-01-10 LAB — TRYPTASE: Tryptase: 4.8 ug/L (ref 2.2–13.2)

## 2019-01-10 LAB — C1 ESTERASE INHIBITOR: C1INH SerPl-mCnc: 34 mg/dL (ref 21–39)

## 2019-01-10 LAB — PORPHYRINS, TOTAL PLASMA: Porphyrins: <0.1 ug/dL (ref 0.0–1.0)

## 2019-01-10 LAB — C1 ESTERASE INHIBITOR, FUNCTIONAL: C1INH Functional/C1INH Total MFr SerPl: >100 %mean normal

## 2019-01-10 LAB — FACTOR 12 ASSAY: Factor XII Activity: 92 % (ref 50–150)

## 2019-01-10 LAB — COMPLEMENT COMPONENT C1Q: Complement C1Q: 14.7 mg/dL (ref 10.3–20.5)

## 2019-01-10 NOTE — Telephone Encounter (Signed)
Results are back but dr bobbitt has not read them yet.  Lm for pt to call our office so we can let her know dr bobbitt has not seen results and is out of the office today and will be back tuesday

## 2019-01-10 NOTE — Telephone Encounter (Signed)
PT called for test results.

## 2019-01-13 NOTE — Telephone Encounter (Signed)
Pt informed Dr. Verlin Fester will have a chance to review results tomorrow.

## 2019-01-14 NOTE — Telephone Encounter (Signed)
See lab notes and call pt.  Thanks.

## 2019-01-29 ENCOUNTER — Encounter: Payer: Self-pay | Admitting: *Deleted

## 2019-01-29 NOTE — Telephone Encounter (Signed)
Pt returned call, results given.  Pt has an appointment with dermatology next week.

## 2019-02-03 DIAGNOSIS — R600 Localized edema: Secondary | ICD-10-CM | POA: Diagnosis not present

## 2019-02-03 DIAGNOSIS — R21 Rash and other nonspecific skin eruption: Secondary | ICD-10-CM | POA: Diagnosis not present

## 2019-02-03 DIAGNOSIS — L299 Pruritus, unspecified: Secondary | ICD-10-CM | POA: Diagnosis not present

## 2019-02-05 ENCOUNTER — Other Ambulatory Visit: Payer: Self-pay | Admitting: Endocrinology

## 2019-02-05 DIAGNOSIS — E119 Type 2 diabetes mellitus without complications: Secondary | ICD-10-CM

## 2019-02-05 DIAGNOSIS — E063 Autoimmune thyroiditis: Secondary | ICD-10-CM

## 2019-02-05 DIAGNOSIS — E782 Mixed hyperlipidemia: Secondary | ICD-10-CM

## 2019-02-06 ENCOUNTER — Other Ambulatory Visit (INDEPENDENT_AMBULATORY_CARE_PROVIDER_SITE_OTHER): Payer: BC Managed Care – PPO

## 2019-02-06 ENCOUNTER — Other Ambulatory Visit: Payer: Self-pay

## 2019-02-06 DIAGNOSIS — E063 Autoimmune thyroiditis: Secondary | ICD-10-CM

## 2019-02-06 DIAGNOSIS — E119 Type 2 diabetes mellitus without complications: Secondary | ICD-10-CM

## 2019-02-06 DIAGNOSIS — E782 Mixed hyperlipidemia: Secondary | ICD-10-CM | POA: Diagnosis not present

## 2019-02-06 LAB — COMPREHENSIVE METABOLIC PANEL
ALT: 17 U/L (ref 0–35)
AST: 24 U/L (ref 0–37)
Albumin: 4.4 g/dL (ref 3.5–5.2)
Alkaline Phosphatase: 85 U/L (ref 39–117)
BUN: 13 mg/dL (ref 6–23)
CO2: 30 mEq/L (ref 19–32)
Calcium: 10 mg/dL (ref 8.4–10.5)
Chloride: 93 mEq/L — ABNORMAL LOW (ref 96–112)
Creatinine, Ser: 0.98 mg/dL (ref 0.40–1.20)
GFR: 68.12 mL/min (ref >60.00)
Glucose, Bld: 143 mg/dL — ABNORMAL HIGH (ref 70–99)
Potassium: 3.7 mEq/L (ref 3.5–5.1)
Sodium: 130 mEq/L — ABNORMAL LOW (ref 135–145)
Total Bilirubin: 0.7 mg/dL (ref 0.2–1.2)
Total Protein: 7.5 g/dL (ref 6.0–8.3)

## 2019-02-06 LAB — LIPID PANEL
Cholesterol: 189 mg/dL (ref 0–200)
HDL: 75.8 mg/dL (ref >39.00)
LDL Cholesterol: 101 mg/dL — ABNORMAL HIGH (ref 0–99)
NonHDL: 113.05
Total CHOL/HDL Ratio: 2
Triglycerides: 61 mg/dL (ref 0.0–149.0)
VLDL: 12.2 mg/dL (ref 0.0–40.0)

## 2019-02-06 LAB — HEMOGLOBIN A1C: Hgb A1c MFr Bld: 8.8 % — ABNORMAL HIGH (ref 4.6–6.5)

## 2019-02-06 LAB — MICROALBUMIN / CREATININE URINE RATIO
Creatinine,U: 37.2 mg/dL
Microalb Creat Ratio: 3.1 mg/g (ref 0.0–30.0)
Microalb, Ur: 1.1 mg/dL (ref 0.0–1.9)

## 2019-02-07 LAB — TSH: TSH: 3.09 u[IU]/mL (ref 0.35–4.50)

## 2019-02-10 ENCOUNTER — Encounter: Payer: Self-pay | Admitting: Endocrinology

## 2019-02-10 ENCOUNTER — Ambulatory Visit: Payer: BC Managed Care – PPO | Admitting: Endocrinology

## 2019-02-10 ENCOUNTER — Other Ambulatory Visit: Payer: Self-pay

## 2019-02-10 VITALS — BP 150/90 | HR 84 | Ht 63.31 in | Wt 171.6 lb

## 2019-02-10 DIAGNOSIS — E063 Autoimmune thyroiditis: Secondary | ICD-10-CM

## 2019-02-10 DIAGNOSIS — E782 Mixed hyperlipidemia: Secondary | ICD-10-CM

## 2019-02-10 DIAGNOSIS — I10 Essential (primary) hypertension: Secondary | ICD-10-CM

## 2019-02-10 DIAGNOSIS — E1165 Type 2 diabetes mellitus with hyperglycemia: Secondary | ICD-10-CM

## 2019-02-10 MED ORDER — INVOKAMET XR 50-1000 MG PO TB24: 2.0000 | ORAL_TABLET | Freq: Every day | ORAL | 1 refills | Status: DC

## 2019-02-10 MED ORDER — CONTOUR NEXT TEST VI STRP: ORAL_STRIP | 12 refills | Status: DC

## 2019-02-10 NOTE — Progress Notes (Signed)
Patient ID: Allison Matthews, female   DOB: 1950/07/10, 68 y.o.   MRN: [id number]   Reason for Appointment:  follow-up   History of Present Illness   Diagnosis: Type 2 DIABETES MELITUS, date of diagnosis:  2005    She was initially treated with insulin and subsequently was transitioned to [place] XR with excellent control Previously had upper normal A1c results and had been fairly compliant with diet and exercise Since early 2014 her A1c levels had been relatively higher  Recent history:   Oral hypoglycemic drugs: None  Her A1c is 8.8%, previously 7%  She has not been seen in follow-up since 04/2017  Current management, blood sugar patterns and problems identified:  She previously had been well controlled on 08/998 [place] XR  However she did not make any follow-up appointment since her visit in 04/2017  Apparently in March she ran out of [place] and did not come back for follow-up  She was followed by her PCP and because of problems with angioedema she was not given any diabetes medications orally  She also had some steroids in June causing her sugars to be higher  Apparently a month or 2 ago she was given a basal insulin and Humalog and took as much as 22 units of basal insulin until she ran out of samples about 2 weeks ago  At that time her blood sugars were in the 200s and occasionally 300 range  She was taking Humalog on a sliding scale  She is using a generic monitor and not keeping a record, the date and time on her meter is not accurate also  In the last few days her blood sugars are appearing to be relatively better although she still has occasional readings over 200 at breakfast on 6 PM  Lab glucose was 143 late morning fasting  She does try to walk fairly regularly at least every other day     Side effects from medications: None  Monitors blood glucose:  every other day .    Glucometer:  Generic, was on Contour        Blood  Glucose readings from patient recall  Recently fasting 190, 156, 230, previously as high as 323  228 at 6 pm, no readings after dinner         Meals: 3 meals per day. Oatmeal in am, evening meal maybe a sandwich             Dietician visit: Most recent: 2009             Wt Readings from Last 3 Encounters:  02/10/19 171 lb 9.6 oz (77.8 kg)  01/02/19 166 lb 6.4 oz (75.5 kg)  03/04/18 164 lb 14.5 oz (74.8 kg)   Diabetes Labs:  Lab Results  Component Value Date   HGBA1C 8.8 (H) 02/06/2019   HGBA1C 7.0 (H) 04/25/2017   HGBA1C 7.0 (H) 12/27/2016   Lab Results  Component Value Date   MICROALBUR 1.1 02/06/2019   LDLCALC 101 (H) 02/06/2019   CREATININE 0.98 02/06/2019    Other problems: See review of systems  Allergies as of 02/10/2019   No Known Allergies     Medication List       Accurate as of February 10, 2019  4:44 PM. If you have any questions, ask your nurse or doctor.        STOP taking these medications   insulin aspart 100 UNIT/ML FlexPen Commonly known as: NOVOLOG Stopped by: Elayne Snare, MD  TAKE these medications   Bayer Contour Next Monitor w/Device Kit Use to check blood sugar 2 times per day dx code E11.65   Bayer Contour Next Test test strip Generic drug: glucose blood USE AS INSTRUCTED TO CHECK BLOOD SUGAR 2 TIMES PER DAY DX CODEE11.65   Bayer Microlet Lancets lancets Use as instructed to check blood sugar 2 times per day dx code E11.65   cetirizine 10 MG tablet Commonly known as: ZYRTEC Take 10 mg by mouth daily.   desonide 0.05 % ointment Commonly known as: DESOWEN Apply sparingly to affected areas on the face or neck   EPINEPHrine 0.3 mg/0.3 mL Soaj injection Commonly known as: EPI-PEN Inject into the muscle.   HumaLOG KwikPen 100 UNIT/ML KwikPen Generic drug: insulin lispro Inject into the skin. Less than 150=0 151-224=8 units 225-299= 11 units 300-374=15 units 375-450= 18 units 450 or above= 23 units    hydrochlorothiazide 25 MG tablet Commonly known as: HYDRODIURIL Take 25 mg by mouth daily.   levothyroxine 50 MCG tablet Commonly known as: SYNTHROID 50 mcg daily.   loratadine 10 MG tablet Commonly known as: CLARITIN Take 10 mg by mouth 2 (two) times daily.   mometasone 0.1 % ointment Commonly known as: ELOCON Apply topically daily. Apply sparingly  to affected areas as needed below face and neck   triamcinolone cream 0.1 % Commonly known as: KENALOG APPLY NECK DOWN 1 2 TIMES A DAY AS NEEDED FOR ITCHY RASH       Allergies: No Known Allergies  Past Medical History:  Diagnosis Date  . Diabetes ([place]) 2005  . High blood pressure 1999    Past Surgical History:  Procedure Laterality Date  . ABDOMINAL HYSTERECTOMY    . GIVENS CAPSULE STUDY N/A 03/04/2018   Procedure: GIVENS CAPSULE STUDY;  Surgeon: Juanita Craver, MD;  Location: [facility] ENDOSCOPY;  Service: Endoscopy;  Laterality: N/A;    Family History  Problem Relation Age of Onset  . Diabetes Mother   . Asthma Grandson   . Eczema Grandson   . Asthma Nephew   . Eczema Daughter     Social History:  reports that she has never smoked. She has never used smokeless tobacco. No history on file for alcohol and drug.  Review of Systems:  She has had diabetic retinopathy treated with laser and follows with Dr. Zadie Rhine,  Stable, last Exam 2/18   HYPERTENSION:  she had fairly good control of her blood pressure with Diovan HCT, half tablet daily, this was stopped in 7/20 Recently she has been on HCTZ 25 mg daily but she just ran out of this  Recent home BP 130/69  History of mild renal insufficiency: Creatinine is recently normal   Lab Results  Component Value Date   CREATININE 0.98 02/06/2019    HYPERLIPIDEMIA: The lipid abnormality consists of elevated LDL.  She has good control of LDL previously with simvastatin but her PCP is now giving her only Zetia  Lab Results  Component Value Date   CHOL 189 02/06/2019   HDL  75.80 02/06/2019   LDLCALC 101 (H) 02/06/2019   LDLDIRECT 144.0 02/18/2016   TRIG 61.0 02/06/2019   CHOLHDL 2 02/06/2019    Mild hypothyroidism: Consistently controlled with 50 g levothyroxine as before   Lab Results  Component Value Date   FREET4 0.89 10/09/2013   TSH 3.09 02/06/2019   TSH 2.22 12/27/2016   TSH 2.12 02/18/2016   Diabetic foot exam in 2/18 showed normal monofilament sensation in the toes and  plantar surfaces, no skin lesions or ulcers on the feet and normal pedal pulses      Examination:   BP (!) 150/90 (BP Location: Left Arm, Patient Position: Sitting, Cuff Size: Normal)   Pulse 84   Ht 5' 3.31" (1.608 m)   Wt 171 lb 9.6 oz (77.8 kg)   SpO2 98%   BMI 30.10 kg/m   Body mass index is 30.1 kg/m.     ASSESSMENT/ PLAN:   Diabetes type 2  See history of present illness for detailed discussion of current diabetes management, blood sugar patterns and problems identified  Her A1c is 8.8  She has not been on any consistent treatment Although she had problems with angioedema and has no diagnosis made in the last 2 weeks her blood sugars are surprisingly not very high as before without any treatment She has monitored blood sugars sporadically and with the generic monitor that may only not be accurate  Discussed that rather than going back to [place] she will benefit from an SGLT2 drug with long-term benefits as well as improved blood pressure control and weight loss This also would be simpler for her since she would only need 1 prescription Not clear if she previously had difficulties with taking larger doses of Metformin by itself  Discussed action of SGLT 2 drugs on lowering glucose by decreasing kidney absorption of glucose, benefits of weight loss and lower blood pressure, possible side effects including candidiasis and dosage regimen   She will start with Invokamet XR 50/1000, 1 tablet daily for 5 days and then 2 tablets daily  She needs to start  back on her Contour meter and prescription given for test strips for this Needs to check blood sugars consistently either morning or after meals If she has any difficulty with this or change in renal function may consider Rybelsus and Metformin separately  Blood pressure previously controlled with  Diovan HCT for several years and she did not have any allergic reactions to this Also her angioedema is not any better with stopping this over 3 months ago  More regular follow-up She will come back in 4 weeks  Hyperlipidemia: Did not think she had any allergic reaction to simvastatin and likely needs to go back to this instead of Zetia for better cardiovascular risk reduction and better LDL control  Hypothyroidism: Adequately controlled with 50 mcg again  Elayne Snare 02/10/2019, 4:44 PM   Lab on 02/06/2019  Component Date Value Ref Range Status  . TSH 02/06/2019 3.09  0.35 - 4.50 uIU/mL Final  . Cholesterol 02/06/2019 189  0 - 200 mg/dL Final   ATP III Classification       Desirable:  < 200 mg/dL               Borderline High:  200 - 239 mg/dL          High:  > = 240 mg/dL  . Triglycerides 02/06/2019 61.0  0.0 - 149.0 mg/dL Final   Normal:  <150 mg/dLBorderline High:  150 - 199 mg/dL  . HDL 02/06/2019 75.80  >39.00 mg/dL Final  . VLDL 02/06/2019 12.2  0.0 - 40.0 mg/dL Final  . LDL Cholesterol 02/06/2019 101* 0 - 99 mg/dL Final  . Total CHOL/HDL Ratio 02/06/2019 2   Final                  Men          Women1/2 Average Risk     3.4  3.3Average Risk          5.0          4.42X Average Risk          9.6          7.13X Average Risk          15.0          11.0                      . NonHDL 02/06/2019 113.05   Final   NOTE:  Non-HDL goal should be 30 mg/dL higher than patient's LDL goal (i.e. LDL goal of < 70 mg/dL, would have non-HDL goal of < 100 mg/dL)  . Microalb, Ur 02/06/2019 1.1  0.0 - 1.9 mg/dL Final  . Creatinine,U 02/06/2019 37.2  mg/dL Final  . Microalb Creat Ratio 02/06/2019  3.1  0.0 - 30.0 mg/g Final  . Sodium 02/06/2019 130* 135 - 145 mEq/L Final  . Potassium 02/06/2019 3.7  3.5 - 5.1 mEq/L Final  . Chloride 02/06/2019 93* 96 - 112 mEq/L Final  . CO2 02/06/2019 30  19 - 32 mEq/L Final  . Glucose, Bld 02/06/2019 143* 70 - 99 mg/dL Final  . BUN 02/06/2019 13  6 - 23 mg/dL Final  . Creatinine, Ser 02/06/2019 0.98  0.40 - 1.20 mg/dL Final  . Total Bilirubin 02/06/2019 0.7  0.2 - 1.2 mg/dL Final  . Alkaline Phosphatase 02/06/2019 85  39 - 117 U/L Final  . AST 02/06/2019 24  0 - 37 U/L Final  . ALT 02/06/2019 17  0 - 35 U/L Final  . Total Protein 02/06/2019 7.5  6.0 - 8.3 g/dL Final  . Albumin 02/06/2019 4.4  3.5 - 5.2 g/dL Final  . Calcium 02/06/2019 10.0  8.4 - 10.5 mg/dL Final  . GFR 02/06/2019 68.12  >60.00 mL/min Final  . Hgb A1c MFr Bld 02/06/2019 8.8* 4.6 - 6.5 % Final   Glycemic Control Guidelines for People with Diabetes:Non Diabetic:  <6%Goal of Therapy: <7%Additional Action Suggested:  >8%     Counseling time on subjects discussed in assessment and plan sections is over 50% of today's 25 minute visit

## 2019-02-10 NOTE — Patient Instructions (Signed)
Check blood sugars on waking up 3-4 days a week  Also check blood sugars about 2 hours after meals and do this after different meals by rotation  Recommended blood sugar levels on waking up are 90-130 and about 2 hours after meal is 130-160  Please bring your blood sugar monitor to each visit, thank you  Invokamet 1 daily for 5 days then 2 daily  Valsartan 1/2 daily

## 2019-02-22 DIAGNOSIS — I10 Essential (primary) hypertension: Secondary | ICD-10-CM | POA: Diagnosis not present

## 2019-02-22 DIAGNOSIS — M130 Polyarthritis, unspecified: Secondary | ICD-10-CM | POA: Diagnosis not present

## 2019-02-22 DIAGNOSIS — E1169 Type 2 diabetes mellitus with other specified complication: Secondary | ICD-10-CM | POA: Diagnosis not present

## 2019-02-22 DIAGNOSIS — L2089 Other atopic dermatitis: Secondary | ICD-10-CM | POA: Diagnosis not present

## 2019-03-13 DIAGNOSIS — H40013 Open angle with borderline findings, low risk, bilateral: Secondary | ICD-10-CM | POA: Diagnosis not present

## 2019-04-05 DIAGNOSIS — G4733 Obstructive sleep apnea (adult) (pediatric): Secondary | ICD-10-CM | POA: Diagnosis not present

## 2019-04-22 DIAGNOSIS — E1169 Type 2 diabetes mellitus with other specified complication: Secondary | ICD-10-CM | POA: Diagnosis not present

## 2019-04-22 DIAGNOSIS — M130 Polyarthritis, unspecified: Secondary | ICD-10-CM | POA: Diagnosis not present

## 2019-04-22 DIAGNOSIS — E785 Hyperlipidemia, unspecified: Secondary | ICD-10-CM | POA: Diagnosis not present

## 2019-04-22 DIAGNOSIS — I10 Essential (primary) hypertension: Secondary | ICD-10-CM | POA: Diagnosis not present

## 2019-04-22 DIAGNOSIS — E039 Hypothyroidism, unspecified: Secondary | ICD-10-CM | POA: Diagnosis not present

## 2019-04-24 DIAGNOSIS — I10 Essential (primary) hypertension: Secondary | ICD-10-CM | POA: Diagnosis not present

## 2019-05-29 ENCOUNTER — Ambulatory Visit: Payer: BC Managed Care – PPO

## 2019-06-02 ENCOUNTER — Other Ambulatory Visit: Payer: Self-pay

## 2019-06-02 ENCOUNTER — Ambulatory Visit: Payer: Self-pay | Attending: Family

## 2019-06-02 DIAGNOSIS — Z23 Encounter for immunization: Secondary | ICD-10-CM | POA: Insufficient documentation

## 2019-06-02 NOTE — Progress Notes (Signed)
   Covid-19 Vaccination Clinic  Name:  Allison Matthews    MRN: [id number] DOB: 09-11-1950  06/02/2019  [name] was observed post Covid-19 immunization for 15 minutes without incidence. She was provided with Vaccine Information Sheet and instruction to access the V-Safe system.   [name] was instructed to call 911 with any severe reactions post vaccine: Marland Kitchen Difficulty breathing  . Swelling of your face and throat  . A fast heartbeat  . A bad rash all over your body  . Dizziness and weakness    Immunizations Administered    Name Date Dose VIS Date Route   Moderna COVID-19 Vaccine 06/02/2019  3:45 PM 0.5 mL 03/11/2019 Intramuscular   Manufacturer: Moderna   Lot: [id number]   NDC: 37793-968-86

## 2019-06-23 DIAGNOSIS — Z6827 Body mass index (BMI) 27.0-27.9, adult: Secondary | ICD-10-CM | POA: Diagnosis not present

## 2019-06-23 DIAGNOSIS — Z01419 Encounter for gynecological examination (general) (routine) without abnormal findings: Secondary | ICD-10-CM | POA: Diagnosis not present

## 2019-07-15 ENCOUNTER — Ambulatory Visit: Payer: Self-pay | Attending: Family

## 2019-07-15 DIAGNOSIS — Z23 Encounter for immunization: Secondary | ICD-10-CM

## 2019-07-15 NOTE — Progress Notes (Signed)
   Covid-19 Vaccination Clinic  Name:  Korena Nass    MRN: [id number] DOB: 1950-05-10  07/15/2019  Ms. Louderback was observed post Covid-19 immunization for 15 minutes without incident. She was provided with Vaccine Information Sheet and instruction to access the V-Safe system.   Ms. Clere was instructed to call 911 with any severe reactions post vaccine: Marland Kitchen Difficulty breathing  . Swelling of face and throat  . A fast heartbeat  . A bad rash all over body  . Dizziness and weakness   Immunizations Administered    Name Date Dose VIS Date Route   Moderna COVID-19 Vaccine 07/15/2019 11:53 AM 0.5 mL 03/11/2019 Intramuscular   Manufacturer: Moderna   Lot: [id number]   NDC: 86767-209-47

## 2019-07-28 ENCOUNTER — Other Ambulatory Visit: Payer: Self-pay

## 2019-07-28 ENCOUNTER — Encounter (INDEPENDENT_AMBULATORY_CARE_PROVIDER_SITE_OTHER): Payer: Self-pay | Admitting: Ophthalmology

## 2019-07-28 ENCOUNTER — Ambulatory Visit (INDEPENDENT_AMBULATORY_CARE_PROVIDER_SITE_OTHER): Payer: BC Managed Care – PPO | Admitting: Ophthalmology

## 2019-07-28 DIAGNOSIS — H348112 Central retinal vein occlusion, right eye, stable: Secondary | ICD-10-CM

## 2019-07-28 DIAGNOSIS — E119 Type 2 diabetes mellitus without complications: Secondary | ICD-10-CM

## 2019-07-28 DIAGNOSIS — H2513 Age-related nuclear cataract, bilateral: Secondary | ICD-10-CM | POA: Insufficient documentation

## 2019-07-28 DIAGNOSIS — H02846 Edema of left eye, unspecified eyelid: Secondary | ICD-10-CM | POA: Insufficient documentation

## 2019-07-28 NOTE — Assessment & Plan Note (Signed)

## 2019-07-28 NOTE — Assessment & Plan Note (Signed)
The nature of central retinal vein occlusion was discussed with the patient including the division of types into nonischemic ischemic. The potential sequelae of ischemic central retinal vein occlusion, including macular edema, neovascularization, rubeosis iridis, and neovascular glaucoma, were discussed, and the need for frequent follow-up.  The nature of macular edema and central retinal vein occlusion was discussed. The following options were considered:  1.Observation for a period to look for spontaneous improvement, is no linger the primary therapy. One-third worsen, one-third stay unchanged, and one-third improves.  2. Anti-VEGF Therapy. ( Lucentis, Avastin or Eylea ) injected  in intravitreal fashion, initially monthly then tailored to clinical response.  3. Intravitreal steroid usage, Kenalog, or Ozurdex, usually a second line therapy or in combination with anti-Vegf therapy noted above.  4. Panretinal laser photocoagulation to cause regression of iris neovascularization, or treat retinal  non-perfusion.  5. Surgical Management may include vitrectomy with incisions of peripheral veins to trigger retino choroidal anastomosis formation. This topic presented and discussed at [facility] 2011.  

## 2019-07-28 NOTE — Progress Notes (Signed)
07/28/2019     CHIEF COMPLAINT Patient presents for Retina Follow Up   HISTORY OF PRESENT ILLNESS: Allison Matthews is a 69 y.o. female who presents to the clinic today for:   HPI    Retina Follow Up    Patient presents with  CRVO/BRVO.  In right eye.  Severity is moderate.  Since onset it is stable.  I, the attending physician,  performed the HPI with the patient and updated documentation appropriately.          Comments    9 Month f\u OU. OCT  Pt states OD vision is blurry but has not noticed any changes. Denies floaters.  BGL: did not check A1C: 7.4       Last edited by Tilda Franco on 07/28/2019  2:22 PM. (History)      Referring physician: Lucianne Lei, MD [address] [place],  [place] [id number]  HISTORICAL INFORMATION:   Selected notes from the MEDICAL RECORD NUMBER    Lab Results  Component Value Date   HGBA1C 8.8 (H) 02/06/2019     CURRENT MEDICATIONS: No current outpatient medications on file. (Ophthalmic Drugs)   No current facility-administered medications for this visit. (Ophthalmic Drugs)   Current Outpatient Medications (Other)  Medication Sig  . BAYER MICROLET LANCETS lancets Use as instructed to check blood sugar 2 times per day dx code E11.65  . Blood Glucose Monitoring Suppl (BAYER CONTOUR NEXT MONITOR) w/Device KIT Use to check blood sugar 2 times per day dx code E11.65  . Canagliflozin-metFORMIN HCl ER (INVOKAMET XR) 50-1000 MG TB24 Take 2 tablets by mouth daily.  . cetirizine (ZYRTEC) 10 MG tablet Take 10 mg by mouth daily.  Marland Kitchen desonide (DESOWEN) 0.05 % ointment Apply sparingly to affected areas on the face or neck  . ezetimibe (ZETIA) 10 MG tablet Take 10 mg by mouth daily.  Marland Kitchen glucose blood (CONTOUR NEXT TEST) test strip Use as instructed  . hydrochlorothiazide (HYDRODIURIL) 25 MG tablet Take 25 mg by mouth daily.  Marland Kitchen levothyroxine (SYNTHROID, LEVOTHROID) 50 MCG tablet 50 mcg daily.   Marland Kitchen loratadine (CLARITIN) 10 MG tablet  Take 10 mg by mouth 2 (two) times daily.  . mometasone (ELOCON) 0.1 % ointment Apply topically daily. Apply sparingly  to affected areas as needed below face and neck  . valsartan-hydrochlorothiazide (DIOVAN-HCT) 160-25 MG tablet Take 1 tablet by mouth daily.  Marland Kitchen EPINEPHrine 0.3 mg/0.3 mL IJ SOAJ injection Inject into the muscle.  . insulin lispro (HUMALOG KWIKPEN) 100 UNIT/ML KwikPen Inject into the skin. Less than 150=0 151-224=8 units 225-299= 11 units 300-374=15 units 375-450= 18 units 450 or above= 23 units  . triamcinolone cream (KENALOG) 0.1 % APPLY NECK DOWN 1 2 TIMES A DAY AS NEEDED FOR ITCHY RASH   No current facility-administered medications for this visit. (Other)      REVIEW OF SYSTEMS: ROS    Positive for: Endocrine   Last edited by Tilda Franco on 07/28/2019  2:22 PM. (History)       ALLERGIES No Known Allergies  PAST MEDICAL HISTORY Past Medical History:  Diagnosis Date  . Diabetes ([place]) 2005  . High blood pressure 1999   Past Surgical History:  Procedure Laterality Date  . ABDOMINAL HYSTERECTOMY    . GIVENS CAPSULE STUDY N/A 03/04/2018   Procedure: GIVENS CAPSULE STUDY;  Surgeon: Juanita Craver, MD;  Location[facility] ENDOSCOPY;  Service: Endoscopy;  Laterality: N/A;    FAMILY HISTORY Family History  Problem  Relation Age of Onset  . Diabetes Mother   . Asthma Grandson   . Eczema Grandson   . Asthma Nephew   . Eczema Daughter     SOCIAL HISTORY Social History   Tobacco Use  . Smoking status: Never Smoker  . Smokeless tobacco: Never Used  Substance Use Topics  . Alcohol use: Not on file  . Drug use: Not on file         OPHTHALMIC EXAM:  Base Eye Exam    Visual Acuity (Snellen - Linear)      Right Left   Dist cc 20/200 -1 20/20   Dist ph cc NI    Correction: Glasses       Tonometry (Tonopen, 2:31 PM)      Right Left   Pressure 15 16       Pupils      Pupils Dark Light Shape React APD   Right PERRL 4 3 Round Brisk +1   Left  PERRL 4 3 Round Brisk None       Visual Fields (Counting fingers)      Left Right   Restrictions  Partial outer superior temporal, superior nasal deficiencies       Neuro/Psych    Oriented x3: Yes   Mood/Affect: Normal       Dilation    Both eyes: 1.0% Mydriacyl, 2.5% Phenylephrine @ 2:31 PM        Slit Lamp and Fundus Exam    External Exam      Right Left   External Normal Normal       Slit Lamp Exam      Right Left   Lids/Lashes Normal Normal   Conjunctiva/Sclera White and quiet White and quiet   Cornea Clear Clear   Anterior Chamber Deep and quiet Deep and quiet   Iris Round and reactive Round and reactive   Lens 3+ Nuclear sclerosis 2+ Nuclear sclerosis   Anterior Vitreous Normal Normal       Fundus Exam      Right Left   Posterior Vitreous Normal Normal   Disc collaterals Normal   C/D Ratio 0.6 0.5   Macula Cystoid macular edema, Macular thickening Normal   Vessels Old central retinal vein occlusion, Normal   Periphery  no neovascularization, Laser scars Normal          IMAGING AND PROCEDURES  Imaging and Procedures for 07/28/19           ASSESSMENT/PLAN:  No problem-specific Assessment & Plan notes found for this encounter.      ICD-10-CM   1. Stable central retinal vein occlusion of right eye  H34.8112 OCT, Retina - OU - Both Eyes  2. Edema of left eyelid  H02.846   3. Diabetes mellitus without complication (HCC)  M84.1     1.  2.  3.  Ophthalmic Meds Ordered this visit:  No orders of the defined types were placed in this encounter.      No follow-ups on file.  There are no Patient Instructions on file for this visit.   Explained the diagnoses, plan, and follow up with the patient and they expressed understanding.  Patient expressed understanding of the importance of proper follow up care.   Clent Demark Brayla Pat M.D. Diseases & Surgery of the Retina and Vitreous Retina & Diabetic [place] 07/28/19     Abbreviations: M  myopia (nearsighted); A astigmatism; H hyperopia (farsighted); P presbyopia; Mrx spectacle prescription;  CTL contact lenses; OD  right eye; OS left eye; OU both eyes  XT exotropia; ET esotropia; PEK punctate epithelial keratitis; PEE punctate epithelial erosions; DES dry eye syndrome; MGD meibomian gland dysfunction; ATs artificial tears; PFAT's preservative free artificial tears; [place] nuclear sclerotic cataract; PSC posterior subcapsular cataract; ERM epi-retinal membrane; PVD posterior vitreous detachment; RD retinal detachment; DM diabetes mellitus; DR diabetic retinopathy; NPDR non-proliferative diabetic retinopathy; PDR proliferative diabetic retinopathy; CSME clinically significant macular edema; DME diabetic macular edema; dbh dot blot hemorrhages; CWS cotton wool spot; POAG primary open angle glaucoma; C/D cup-to-disc ratio; HVF humphrey visual field; GVF goldmann visual field; OCT optical coherence tomography; IOP intraocular pressure; BRVO Branch retinal vein occlusion; CRVO central retinal vein occlusion; CRAO central retinal artery occlusion; BRAO branch retinal artery occlusion; RT retinal tear; SB scleral buckle; PPV pars plana vitrectomy; VH Vitreous hemorrhage; PRP panretinal laser photocoagulation; IVK intravitreal kenalog; VMT vitreomacular traction; MH Macular hole;  NVD neovascularization of the disc; NVE neovascularization elsewhere; AREDS age related eye disease study; ARMD age related macular degeneration; POAG primary open angle glaucoma; EBMD epithelial/anterior basement membrane dystrophy; ACIOL anterior chamber intraocular lens; IOL intraocular lens; PCIOL posterior chamber intraocular lens; Phaco/IOL phacoemulsification with intraocular lens placement; [place] photorefractive keratectomy; LASIK laser assisted in situ keratomileusis; HTN hypertension; DM diabetes mellitus; COPD chronic obstructive pulmonary disease

## 2019-08-23 DIAGNOSIS — E785 Hyperlipidemia, unspecified: Secondary | ICD-10-CM | POA: Diagnosis not present

## 2019-08-23 DIAGNOSIS — E119 Type 2 diabetes mellitus without complications: Secondary | ICD-10-CM | POA: Diagnosis not present

## 2019-08-23 DIAGNOSIS — I10 Essential (primary) hypertension: Secondary | ICD-10-CM | POA: Diagnosis not present

## 2019-08-23 DIAGNOSIS — E1169 Type 2 diabetes mellitus with other specified complication: Secondary | ICD-10-CM | POA: Diagnosis not present

## 2019-09-04 DIAGNOSIS — Z1231 Encounter for screening mammogram for malignant neoplasm of breast: Secondary | ICD-10-CM | POA: Diagnosis not present

## 2019-09-18 ENCOUNTER — Encounter: Payer: Self-pay | Admitting: Ophthalmology

## 2019-11-07 ENCOUNTER — Other Ambulatory Visit: Payer: Self-pay | Admitting: Endocrinology

## 2019-11-07 ENCOUNTER — Other Ambulatory Visit: Payer: Self-pay

## 2019-11-07 ENCOUNTER — Other Ambulatory Visit (INDEPENDENT_AMBULATORY_CARE_PROVIDER_SITE_OTHER): Payer: Self-pay

## 2019-11-07 DIAGNOSIS — E1165 Type 2 diabetes mellitus with hyperglycemia: Secondary | ICD-10-CM

## 2019-11-07 LAB — BASIC METABOLIC PANEL
BUN: 18 mg/dL (ref 6–23)
CO2: 28 mEq/L (ref 19–32)
Calcium: 10 mg/dL (ref 8.4–10.5)
Chloride: 98 mEq/L (ref 96–112)
Creatinine, Ser: 1.2 mg/dL (ref 0.40–1.20)
GFR: 53.8 mL/min — ABNORMAL LOW (ref >60.00)
Glucose, Bld: 141 mg/dL — ABNORMAL HIGH (ref 70–99)
Potassium: 4.2 mEq/L (ref 3.5–5.1)
Sodium: 132 mEq/L — ABNORMAL LOW (ref 135–145)

## 2019-11-07 LAB — HEMOGLOBIN A1C: Hgb A1c MFr Bld: 9.3 % — ABNORMAL HIGH (ref 4.6–6.5)

## 2019-11-08 LAB — FRUCTOSAMINE: Fructosamine: 444 umol/L — ABNORMAL HIGH (ref 0–285)

## 2019-11-11 ENCOUNTER — Ambulatory Visit (INDEPENDENT_AMBULATORY_CARE_PROVIDER_SITE_OTHER): Payer: 59 | Admitting: Endocrinology

## 2019-11-11 ENCOUNTER — Encounter: Payer: Self-pay | Admitting: Endocrinology

## 2019-11-11 ENCOUNTER — Other Ambulatory Visit: Payer: Self-pay

## 2019-11-11 VITALS — BP 110/70 | HR 78 | Ht 63.0 in | Wt 163.6 lb

## 2019-11-11 DIAGNOSIS — E1165 Type 2 diabetes mellitus with hyperglycemia: Secondary | ICD-10-CM | POA: Diagnosis not present

## 2019-11-11 DIAGNOSIS — I10 Essential (primary) hypertension: Secondary | ICD-10-CM | POA: Diagnosis not present

## 2019-11-11 MED ORDER — INVOKAMET XR 50-1000 MG PO TB24: 2.0000 | ORAL_TABLET | Freq: Every day | ORAL | 1 refills | Status: DC

## 2019-11-11 MED ORDER — ROSUVASTATIN CALCIUM 5 MG PO TABS
5.0000 mg | ORAL_TABLET | Freq: Every day | ORAL | 0 refills | Status: DC
Start: 2019-11-11 — End: 2020-02-05

## 2019-11-11 NOTE — Patient Instructions (Addendum)
Check blood sugars on waking up 3 days a week  Also check blood sugars about 2 hours after meals and do this after different meals by rotation  Recommended blood sugar levels on waking up are 90-130 and about 2 hours after meal is 130-160  Please bring your blood sugar monitor to each visit, thank you  Stop HCTZ

## 2019-11-11 NOTE — Progress Notes (Signed)
Patient ID: Allison Matthews, female   DOB: [birth year], 69 y.o.   MRN: [id number]   Reason for Appointment:  follow-up   History of Present Illness   Diagnosis: Type 2 DIABETES MELITUS, date of diagnosis:  2005    She was initially treated with insulin and subsequently was transitioned to [place] XR with excellent control Previously had upper normal A1c results and had been fairly compliant with diet and exercise Since early 2014 her A1c levels had been relatively higher  Recent history:   Oral hypoglycemic drugs: None  Her A1c is 9.3  She has not been seen in follow-up since 11/20  Current management, blood sugar patterns and problems identified:  She was given Invokamet in 11/20 because of poor control and long-term benefit compared to [facility]  However even though her blood sugars were excellent with this medication she stopped this when she ran out about 6 months ago  She says she was trying to change her diet with healthy eating and cutting back on own evening meals  Also trying to walk about 10 miles a week usually  She has lost 8 pounds  However she feels her energy level is low  Mostly checking blood sugars in the mornings and evenings, some of the evening readings may be after meals and usually not checking after lunch which is her main meal  Morning sugars are relatively better at times     Side effects from medications: None  Monitors blood glucose:  every other day .    Glucometer:  Generic, was on Contour        Blood Glucose readings from download as follows   PRE-MEAL Fasting Lunch Dinner Bedtime Overall  Glucose range:  142-231   113    Mean/median:  180     186   POST-MEAL PC Breakfast PC Lunch PC Dinner  Glucose range:    144-266  Mean/median:    197     Previously:  Fasting 190, 156, 230, previously as high as 323  228 at 6 pm, no readings after dinner             Dietician visit: Most recent: 2009              Wt Readings from Last 3 Encounters:  11/11/19 163 lb 9.6 oz (74.2 kg)  02/10/19 171 lb 9.6 oz (77.8 kg)  01/02/19 166 lb 6.4 oz (75.5 kg)   Diabetes Labs:  Lab Results  Component Value Date   HGBA1C 9.3 (H) 11/07/2019   HGBA1C 8.8 (H) 02/06/2019   HGBA1C 7.0 (H) 04/25/2017   Lab Results  Component Value Date   MICROALBUR 1.1 02/06/2019   LDLCALC 101 (H) 02/06/2019   CREATININE 1.20 11/07/2019    Other problems: See review of systems  Allergies as of 11/11/2019   No Known Allergies     Medication List       Accurate as of November 11, 2019  1:57 PM. If you have any questions, ask your nurse or doctor.        Bayer Contour Next Monitor w/Device Kit Use to check blood sugar 2 times per day dx code E11.65   Bayer Microlet Lancets lancets Use as instructed to check blood sugar 2 times per day dx code E11.65   cetirizine 10 MG tablet Commonly known as: ZYRTEC Take 10 mg by mouth daily.   Contour Next Test test strip Generic drug: glucose blood Use as instructed   desonide 0.05 %  ointment Commonly known as: DESOWEN Apply sparingly to affected areas on the face or neck   EPINEPHrine 0.3 mg/0.3 mL Soaj injection Commonly known as: EPI-PEN Inject into the muscle.   ezetimibe 10 MG tablet Commonly known as: ZETIA Take 10 mg by mouth daily.   HumaLOG KwikPen 100 UNIT/ML KwikPen Generic drug: insulin lispro Inject into the skin. Less than 150=0 151-224=8 units 225-299= 11 units 300-374=15 units 375-450= 18 units 450 or above= 23 units   hydrochlorothiazide 25 MG tablet Commonly known as: HYDRODIURIL Take 25 mg by mouth daily.   Invokamet XR 50-1000 MG Tb24 Generic drug: Canagliflozin-metFORMIN HCl ER Take 2 tablets by mouth daily.   levothyroxine 50 MCG tablet Commonly known as: SYNTHROID 50 mcg daily.   loratadine 10 MG tablet Commonly known as: CLARITIN Take 10 mg by mouth 2 (two) times daily.   mometasone 0.1 % ointment Commonly known as:  ELOCON Apply topically daily. Apply sparingly  to affected areas as needed below face and neck   triamcinolone cream 0.1 % Commonly known as: KENALOG APPLY NECK DOWN 1 2 TIMES A DAY AS NEEDED FOR ITCHY RASH   valsartan-hydrochlorothiazide 160-25 MG tablet Commonly known as: DIOVAN-HCT Take 1 tablet by mouth daily.       Allergies: No Known Allergies  Past Medical History:  Diagnosis Date  . Diabetes ([place]) 2005  . High blood pressure 1999    Past Surgical History:  Procedure Laterality Date  . ABDOMINAL HYSTERECTOMY    . GIVENS CAPSULE STUDY N/A 03/04/2018   Procedure: GIVENS CAPSULE STUDY;  Surgeon: Juanita Craver, MD;  Location: [facility] ENDOSCOPY;  Service: Endoscopy;  Laterality: N/A;    Family History  Problem Relation Age of Onset  . Diabetes Mother   . Asthma Grandson   . Eczema Grandson   . Asthma Nephew   . Eczema Daughter     Social History:  reports that she has never smoked. She has never used smokeless tobacco. No history on file for alcohol use and drug use.  Review of Systems:  She has had diabetic retinopathy treated with laser and follows with Dr. Zadie Rhine  HYPERTENSION:  she had fairly good control of her blood pressure with Diovan HCT, half tablet daily, this was reportedly stopped in 7/20 She thinks she is taking only 12.5 mg HCTZ  BP Readings from Last 3 Encounters:  11/11/19 110/70  02/10/19 (!) 150/90  01/02/19 (!) 144/78    Recent home BP   History of mild renal insufficiency: Creatinine is recently normal   Lab Results  Component Value Date   CREATININE 1.20 11/07/2019    HYPERLIPIDEMIA: The lipid abnormality consists of elevated LDL.  She has good control of LDL previously with Crestor but her PCP is now giving her only Zetia, unlikely she had any allergic reactions to this  Lab Results  Component Value Date   CHOL 189 02/06/2019   HDL 75.80 02/06/2019   LDLCALC 101 (H) 02/06/2019   LDLDIRECT 144.0 02/18/2016   TRIG 61.0  02/06/2019   CHOLHDL 2 02/06/2019    Mild hypothyroidism: Has been usually controlled with 50 g levothyroxine Last TSH was 4.3 done in 1/21   Lab Results  Component Value Date   FREET4 0.89 10/09/2013   TSH 3.09 02/06/2019   TSH 2.22 12/27/2016   TSH 2.12 02/18/2016   Diabetic foot exam in 2/18 showed normal monofilament sensation in the toes and plantar surfaces, no skin lesions or ulcers on the feet and normal pedal pulses  Examination:   BP 110/70 (BP Location: Left Arm, Patient Position: Sitting, Cuff Size: Normal)   Pulse 78   Ht '5\' 3"'  (1.6 m)   Wt 163 lb 9.6 oz (74.2 kg)   SpO2 99%   BMI 28.98 kg/m   Body mass index is 28.98 kg/m.     ASSESSMENT/ PLAN:   Diabetes type 2  See history of present illness for detailed discussion of current diabetes management, blood sugar patterns and problems identified  Her A1c is 9.3  Blood sugars are significantly higher with no treatment She is subjectively not feeling as well also She has done well with diet and exercise regimen discussed that with her insulin resistance and partial insulin deficiency she will need to be on medication long-term  She will start back on Invokamet XR 50/1000, 2 tablets daily which was working well before  Continue checking blood sugars either after meals or fasting including after lunch  Blood pressure is low normal and since she thinks she is having photosensitivity from HCTZ 12.5 mg dose she can stop this Blood pressure would likely be better with the Invokamet  Still needs to have regular follow-up She will come back in 6 weeks  Hyperlipidemia: Not controlled, needs to go back to Crestor instead of Zetia for better cardiovascular risk reduction and better LDL control. We will start her on 5 mg daily  Hypothyroidism: Adequately controlled with 50 mcg as of 1/21  Elayne Snare 11/11/2019, 1:57 PM

## 2019-12-23 ENCOUNTER — Other Ambulatory Visit (INDEPENDENT_AMBULATORY_CARE_PROVIDER_SITE_OTHER): Payer: 59

## 2019-12-23 ENCOUNTER — Other Ambulatory Visit: Payer: Self-pay

## 2019-12-23 DIAGNOSIS — E1165 Type 2 diabetes mellitus with hyperglycemia: Secondary | ICD-10-CM

## 2019-12-23 LAB — BASIC METABOLIC PANEL
BUN: 26 mg/dL — ABNORMAL HIGH (ref 6–23)
CO2: 28 mEq/L (ref 19–32)
Calcium: 10.3 mg/dL (ref 8.4–10.5)
Chloride: 103 mEq/L (ref 96–112)
Creatinine, Ser: 1.3 mg/dL — ABNORMAL HIGH (ref 0.40–1.20)
GFR: 49.04 mL/min — ABNORMAL LOW (ref >60.00)
Glucose, Bld: 130 mg/dL — ABNORMAL HIGH (ref 70–99)
Potassium: 4.8 mEq/L (ref 3.5–5.1)
Sodium: 139 mEq/L (ref 135–145)

## 2019-12-24 LAB — FRUCTOSAMINE: Fructosamine: 346 umol/L — ABNORMAL HIGH (ref 0–285)

## 2019-12-25 ENCOUNTER — Ambulatory Visit (INDEPENDENT_AMBULATORY_CARE_PROVIDER_SITE_OTHER): Payer: 59 | Admitting: Endocrinology

## 2019-12-25 ENCOUNTER — Encounter: Payer: Self-pay | Admitting: Endocrinology

## 2019-12-25 ENCOUNTER — Other Ambulatory Visit: Payer: Self-pay

## 2019-12-25 VITALS — BP 132/70 | HR 75 | Ht 63.0 in | Wt 164.0 lb

## 2019-12-25 DIAGNOSIS — I10 Essential (primary) hypertension: Secondary | ICD-10-CM

## 2019-12-25 DIAGNOSIS — E1165 Type 2 diabetes mellitus with hyperglycemia: Secondary | ICD-10-CM | POA: Diagnosis not present

## 2019-12-25 DIAGNOSIS — E063 Autoimmune thyroiditis: Secondary | ICD-10-CM | POA: Diagnosis not present

## 2019-12-25 DIAGNOSIS — E782 Mixed hyperlipidemia: Secondary | ICD-10-CM | POA: Diagnosis not present

## 2019-12-25 NOTE — Progress Notes (Addendum)
Patient ID: Allison Matthews, female   DOB: 05/30/1950, 69 y.o.   MRN: [id number]   Reason for Appointment:  follow-up   History of Present Illness   Diagnosis: Type 2 DIABETES MELITUS, date of diagnosis:  2005    She was initially treated with insulin and subsequently was transitioned to [place] XR with excellent control Previously had upper normal A1c results and had been fairly compliant with diet and exercise Since early 2014 her A1c levels had been relatively higher  Recent history:   Oral hypoglycemic drugs: Invokamet XR 50/1000, 2 tablets daily  Her A1c in 7/21 was 9.3  Fructosamine is 346, improved  Current management, blood sugar patterns and problems identified:  She was restarted on her Invokamet in 11/2019, prior to this she had not been taking her medications and had significant hyperglycemia with blood sugars as high as 266  She has taken this regularly in the morning with breakfast  Does not think she has any side effects  Her weight is up a pound but she has been on vacation lately  Blood sugars have improved progressively at home and recently highest blood sugars only 171  Her largest meal is at lunch  Most of her readings in the afternoons are fairly good  Also fasting readings are improving and in the low 100 range recently  She is recently starting to walk regularly also     Side effects from medications: None  Monitors blood glucose:  every other day .    Glucometer:   Contour        Blood Glucose readings from download as follows   PRE-MEAL Fasting Lunch Dinner Bedtime Overall  Glucose range:  101-171  113-124     Mean/median:  124     123   POST-MEAL PC Breakfast PC Lunch PC Dinner  Glucose range:   116-166  85-164  Mean/median:   133  128   Previous readings:  PRE-MEAL Fasting Lunch Dinner Bedtime Overall  Glucose range:  142-231   113    Mean/median:  180     186   POST-MEAL PC Breakfast PC Lunch PC Dinner   Glucose range:    144-266  Mean/median:    197              Dietician visit: Most recent: 2009             Wt Readings from Last 3 Encounters:  12/25/19 164 lb (74.4 kg)  11/11/19 163 lb 9.6 oz (74.2 kg)  02/10/19 171 lb 9.6 oz (77.8 kg)   Diabetes Labs:  Lab Results  Component Value Date   HGBA1C 9.3 (H) 11/07/2019   HGBA1C 8.8 (H) 02/06/2019   HGBA1C 7.0 (H) 04/25/2017   Lab Results  Component Value Date   MICROALBUR 1.1 02/06/2019   LDLCALC 101 (H) 02/06/2019   CREATININE 1.30 (H) 12/23/2019    Other problems: See review of systems  Allergies as of 12/25/2019   No Known Allergies     Medication List       Accurate as of December 25, 2019  4:04 PM. If you have any questions, ask your nurse or doctor.        Bayer Contour Next Monitor w/Device Kit Use to check blood sugar 2 times per day dx code E11.65   Bayer Microlet Lancets lancets Use as instructed to check blood sugar 2 times per day dx code E11.65   cetirizine 10 MG tablet Commonly known as:  ZYRTEC Take 10 mg by mouth daily.   Contour Next Test test strip Generic drug: glucose blood Use as instructed   desonide 0.05 % ointment Commonly known as: DESOWEN Apply sparingly to affected areas on the face or neck   doxycycline 100 MG tablet Commonly known as: VIBRA-TABS Take 1 tablet by mouth 2 (two) times daily.   EPINEPHrine 0.3 mg/0.3 mL Soaj injection Commonly known as: EPI-PEN Inject into the muscle.   etodolac 400 MG tablet Commonly known as: LODINE Take 400 mg by mouth 2 (two) times daily.   ezetimibe 10 MG tablet Commonly known as: ZETIA Take 10 mg by mouth daily.   hydrochlorothiazide 25 MG tablet Commonly known as: HYDRODIURIL Take 25 mg by mouth daily.   Invokamet XR 50-1000 MG Tb24 Generic drug: Canagliflozin-metFORMIN HCl ER Take 2 tablets by mouth daily.   levothyroxine 50 MCG tablet Commonly known as: SYNTHROID 50 mcg daily.   loratadine 10 MG tablet Commonly  known as: CLARITIN Take 10 mg by mouth 2 (two) times daily.   mometasone 0.1 % ointment Commonly known as: ELOCON Apply topically daily. Apply sparingly  to affected areas as needed below face and neck   rosuvastatin 5 MG tablet Commonly known as: Crestor Take 1 tablet (5 mg total) by mouth daily.   triamcinolone cream 0.1 % Commonly known as: KENALOG APPLY NECK DOWN 1 2 TIMES A DAY AS NEEDED FOR ITCHY RASH   valsartan-hydrochlorothiazide 160-25 MG tablet Commonly known as: DIOVAN-HCT Take 1 tablet by mouth daily.       Allergies: No Known Allergies  Past Medical History:  Diagnosis Date  . Diabetes ([place]) 2005  . High blood pressure 1999    Past Surgical History:  Procedure Laterality Date  . ABDOMINAL HYSTERECTOMY    . GIVENS CAPSULE STUDY N/A 03/04/2018   Procedure: GIVENS CAPSULE STUDY;  Surgeon: Juanita Craver, MD;  Location: [facility] ENDOSCOPY;  Service: Endoscopy;  Laterality: N/A;    Family History  Problem Relation Age of Onset  . Diabetes Mother   . Asthma Grandson   . Eczema Grandson   . Asthma Nephew   . Eczema Daughter     Social History:  reports that she has never smoked. She has never used smokeless tobacco. No history on file for alcohol use and drug use.  Review of Systems:  She has had diabetic retinopathy treated with laser and follows with Dr. Zadie Rhine  HYPERTENSION:  she had good control of her blood pressure with Diovan HCT, half tablet daily   BP Readings from Last 3 Encounters:  12/25/19 132/70  11/11/19 110/70  02/10/19 (!) 150/90    History of mild renal insufficiency: Creatinine is slightly higher, recently started on Invokana  Lab Results  Component Value Date   CREATININE 1.30 (H) 12/23/2019   CREATININE 1.20 11/07/2019   CREATININE 0.98 02/06/2019    HYPERLIPIDEMIA: The lipid abnormality consists of elevated LDL.  She has good control of LDL previously with Crestor and she is back on this Will need follow-up levels  Lab  Results  Component Value Date   CHOL 189 02/06/2019   HDL 75.80 02/06/2019   LDLCALC 101 (H) 02/06/2019   LDLDIRECT 144.0 02/18/2016   TRIG 61.0 02/06/2019   CHOLHDL 2 02/06/2019    Mild hypothyroidism: Has been usually controlled with 50 g levothyroxine Last TSH was 4.3 done in 1/21   Lab Results  Component Value Date   FREET4 0.89 10/09/2013   TSH 3.09 02/06/2019   TSH  2.22 12/27/2016   TSH 2.12 02/18/2016   Diabetic foot exam in 9/21 showed normal monofilament sensation in the toes and plantar surfaces, no skin lesions or ulcers on the feet and normal pedal pulses      Examination:   BP 132/70   Pulse 75   Ht '5\' 3"'  (1.6 m)   Wt 164 lb (74.4 kg)   SpO2 98%   BMI 29.05 kg/m   Body mass index is 29.05 kg/m.   No ankle edema  Diabetic Foot Exam - Simple   Simple Foot Form Diabetic Foot exam was performed with the following findings: Yes   Visual Inspection No deformities, no ulcerations, no other skin breakdown bilaterally: Yes Sensation Testing Intact to touch and monofilament testing bilaterally: Yes Pulse Check Posterior Tibialis and Dorsalis pulse intact bilaterally: Yes Comments      ASSESSMENT/ PLAN:   Diabetes type 2  See history of present illness for detailed discussion of current diabetes management, blood sugar patterns and problems identified  Her A1c recently was 9.3 Fructosamine is 346  Blood sugars are progressively improving with restarting Invokamet XR maximum doses which she had taken before No side effects She has continued to be watching her diet as before and trying to walk regularly Subjectively feeling better also She understands the need to use pharmacological treatment to control her hyperglycemia and hopefully avoid insulin down the road  A1c will be checked on the next visit She will keep on checking blood sugars either after meals or fasting including after lunch which is her main meal  Blood pressure is not as low with  stopping HCTZ  She will continue to monitor at home also  Renal insufficiency: This is mild and likely transient with starting Invokana but also from her recently taking etodolac for her chest wall pain  She will try to leave off her etodolac as soon as possible  Hyperlipidemia: Recently on Crestor and will need follow-up on the next visit    Elayne Snare 12/25/2019, 4:04 PM

## 2019-12-26 ENCOUNTER — Other Ambulatory Visit: Payer: Self-pay | Admitting: Endocrinology

## 2019-12-29 NOTE — Telephone Encounter (Signed)
Does she need to take 2 tabs daily in am, as she did with Invokamet?

## 2019-12-29 NOTE — Telephone Encounter (Signed)
Not clear why her [place] is shown as both covered and not covered unless the XR is not covered

## 2019-12-29 NOTE — Telephone Encounter (Signed)
She will take 2 tablets daily if getting the XR

## 2020-02-05 ENCOUNTER — Other Ambulatory Visit: Payer: Self-pay | Admitting: Endocrinology

## 2020-02-10 ENCOUNTER — Ambulatory Visit: Payer: Medicare Other | Attending: Internal Medicine

## 2020-02-10 DIAGNOSIS — Z23 Encounter for immunization: Secondary | ICD-10-CM

## 2020-02-10 NOTE — Progress Notes (Signed)
   Covid-19 Vaccination Clinic  Name:  Brittyn Salaz    MRN: [id number] DOB: 04-22-1950  02/10/2020  Ms. Smyser was observed post Covid-19 immunization for 15 minutes without incident. She was provided with Vaccine Information Sheet and instruction to access the V-Safe system.   Ms. Bump was instructed to call 911 with any severe reactions post vaccine: Marland Kitchen Difficulty breathing  . Swelling of face and throat  . A fast heartbeat  . A bad rash all over body  . Dizziness and weakness

## 2020-02-19 ENCOUNTER — Other Ambulatory Visit: Payer: Self-pay | Admitting: Endocrinology

## 2020-03-29 ENCOUNTER — Other Ambulatory Visit: Payer: Self-pay

## 2020-03-29 ENCOUNTER — Other Ambulatory Visit (INDEPENDENT_AMBULATORY_CARE_PROVIDER_SITE_OTHER): Payer: 59

## 2020-03-29 DIAGNOSIS — E782 Mixed hyperlipidemia: Secondary | ICD-10-CM | POA: Diagnosis not present

## 2020-03-29 DIAGNOSIS — E1165 Type 2 diabetes mellitus with hyperglycemia: Secondary | ICD-10-CM | POA: Diagnosis not present

## 2020-03-29 DIAGNOSIS — I10 Essential (primary) hypertension: Secondary | ICD-10-CM

## 2020-03-29 DIAGNOSIS — E063 Autoimmune thyroiditis: Secondary | ICD-10-CM | POA: Diagnosis not present

## 2020-03-29 LAB — LIPID PANEL
Cholesterol: 156 mg/dL (ref 0–200)
HDL: 67.3 mg/dL (ref >39.00)
LDL Cholesterol: 72 mg/dL (ref 0–99)
NonHDL: 88.37
Total CHOL/HDL Ratio: 2
Triglycerides: 80 mg/dL (ref 0.0–149.0)
VLDL: 16 mg/dL (ref 0.0–40.0)

## 2020-03-29 LAB — COMPREHENSIVE METABOLIC PANEL
ALT: 17 U/L (ref 0–35)
AST: 21 U/L (ref 0–37)
Albumin: 4.5 g/dL (ref 3.5–5.2)
Alkaline Phosphatase: 97 U/L (ref 39–117)
BUN: 25 mg/dL — ABNORMAL HIGH (ref 6–23)
CO2: 29 mEq/L (ref 19–32)
Calcium: 10.3 mg/dL (ref 8.4–10.5)
Chloride: 101 mEq/L (ref 96–112)
Creatinine, Ser: 1.3 mg/dL — ABNORMAL HIGH (ref 0.40–1.20)
GFR: 41.82 mL/min — ABNORMAL LOW (ref >60.00)
Glucose, Bld: 138 mg/dL — ABNORMAL HIGH (ref 70–99)
Potassium: 3.8 mEq/L (ref 3.5–5.1)
Sodium: 138 mEq/L (ref 135–145)
Total Bilirubin: 0.5 mg/dL (ref 0.2–1.2)
Total Protein: 7.8 g/dL (ref 6.0–8.3)

## 2020-03-29 LAB — TSH: TSH: 1.84 u[IU]/mL (ref 0.35–4.50)

## 2020-03-29 LAB — HEMOGLOBIN A1C: Hgb A1c MFr Bld: 7.4 % — ABNORMAL HIGH (ref 4.6–6.5)

## 2020-04-01 ENCOUNTER — Ambulatory Visit: Payer: 59 | Admitting: Endocrinology

## 2020-04-05 ENCOUNTER — Ambulatory Visit (INDEPENDENT_AMBULATORY_CARE_PROVIDER_SITE_OTHER): Payer: 59 | Admitting: Endocrinology

## 2020-04-05 ENCOUNTER — Encounter: Payer: Self-pay | Admitting: Endocrinology

## 2020-04-05 ENCOUNTER — Other Ambulatory Visit: Payer: Self-pay

## 2020-04-05 VITALS — BP 126/74 | HR 68 | Ht 63.0 in | Wt 160.8 lb

## 2020-04-05 DIAGNOSIS — E1165 Type 2 diabetes mellitus with hyperglycemia: Secondary | ICD-10-CM

## 2020-04-05 DIAGNOSIS — E063 Autoimmune thyroiditis: Secondary | ICD-10-CM

## 2020-04-05 DIAGNOSIS — N289 Disorder of kidney and ureter, unspecified: Secondary | ICD-10-CM | POA: Diagnosis not present

## 2020-04-05 DIAGNOSIS — I10 Essential (primary) hypertension: Secondary | ICD-10-CM | POA: Diagnosis not present

## 2020-04-05 NOTE — Progress Notes (Signed)
Patient ID: Allison Matthews, female   DOB: March 05, 1951, 69 y.o.   MRN: [id number]   Reason for Appointment:  follow-up   History of Present Illness   Diagnosis: Type 2 DIABETES MELITUS, date of diagnosis:  2005    She was initially treated with insulin and subsequently was transitioned to [place] XR with excellent control Previously had upper normal A1c results and had been fairly compliant with diet and exercise Since early 2014 her A1c levels had been relatively higher  Recent history:   Oral hypoglycemic drugs: Invokamet XR 50/1000, 2 tablets daily  Her A1c in 7/21 was 9.3 and is now 7.4   Current management, blood sugar patterns and problems identified:  She was restarted on her Invokamet in 11/2019, prior to this she had not been taking her medications and had significant hyperglycemia  She is tolerating this very well and has been regular on her Invokamet as prescribed  She has done some blood sugars at all different times and they appear to be generally fairly good  She tends to have high readings fasting in the morning except for one of the 4 weeks analyzed and lab glucose was 138 fasting  He has done well with 4 pound weight loss  Is trying to walk regularly up to 45 minutes and can do this indoors at work also  Usually watching her diet, largest meal is at lunch     Side effects from medications: None  Monitors blood glucose:  every other day .    Glucometer:   Contour        Blood Glucose readings from download as follows   PRE-MEAL Fasting Lunch Dinner Bedtime Overall  Glucose range:  111-161   105    Mean/median:  133    122   POST-MEAL PC Breakfast PC Lunch PC Dinner  Glucose range:   83  95-150  Mean/median:  128      PREVIOUS data:  PRE-MEAL Fasting Lunch Dinner Bedtime Overall  Glucose range:  101-171  113-124     Mean/median:  124     123   POST-MEAL PC Breakfast PC Lunch PC Dinner  Glucose range:   116-166  85-164   Mean/median:   133  128              Dietician visit: Most recent: 2009             Wt Readings from Last 3 Encounters:  04/05/20 160 lb 12.8 oz (72.9 kg)  12/25/19 164 lb (74.4 kg)  11/11/19 163 lb 9.6 oz (74.2 kg)   Diabetes Labs:  Lab Results  Component Value Date   HGBA1C 7.4 (H) 03/29/2020   HGBA1C 9.3 (H) 11/07/2019   HGBA1C 8.8 (H) 02/06/2019   Lab Results  Component Value Date   MICROALBUR 1.1 02/06/2019   LDLCALC 72 03/29/2020   CREATININE 1.30 (H) 03/29/2020    Other problems: See review of systems  Allergies as of 04/05/2020   No Known Allergies     Medication List       Accurate as of April 05, 2020 11:19 AM. If you have any questions, ask your nurse or doctor.        Bayer Contour Next Monitor w/Device Kit Use to check blood sugar 2 times per day dx code E11.65   Bayer Microlet Lancets lancets Use as instructed to check blood sugar 2 times per day dx code E11.65   Contour Next Test test strip Generic  drug: glucose blood USE AS INSTRUCTED   desonide 0.05 % ointment Commonly known as: DESOWEN Apply sparingly to affected areas on the face or neck   EPINEPHrine 0.3 mg/0.3 mL Soaj injection Commonly known as: EPI-PEN Inject into the muscle.   etodolac 400 MG tablet Commonly known as: LODINE Take 400 mg by mouth 2 (two) times daily.   ezetimibe 10 MG tablet Commonly known as: ZETIA Take 10 mg by mouth daily.   levothyroxine 50 MCG tablet Commonly known as: SYNTHROID 50 mcg daily.   loratadine 10 MG tablet Commonly known as: CLARITIN Take 10 mg by mouth 2 (two) times daily.   mometasone 0.1 % ointment Commonly known as: ELOCON Apply topically daily. Apply sparingly  to affected areas as needed below face and neck   rosuvastatin 5 MG tablet Commonly known as: CRESTOR TAKE 1 TABLET BY MOUTH EVERY DAY   Synjardy 08-998 MG Tabs Generic drug: Empagliflozin-metFORMIN HCl Take 2 tablets by mouth daily.   triamcinolone 0.1  % Commonly known as: KENALOG APPLY NECK DOWN 1 2 TIMES A DAY AS NEEDED FOR ITCHY RASH   valsartan-hydrochlorothiazide 160-25 MG tablet Commonly known as: DIOVAN-HCT Take 1 tablet by mouth daily.       Allergies: No Known Allergies  Past Medical History:  Diagnosis Date  . Diabetes ([place]) 2005  . High blood pressure 1999    Past Surgical History:  Procedure Laterality Date  . ABDOMINAL HYSTERECTOMY    . GIVENS CAPSULE STUDY N/A 03/04/2018   Procedure: GIVENS CAPSULE STUDY;  Surgeon: Juanita Craver, MD;  Location: [facility] ENDOSCOPY;  Service: Endoscopy;  Laterality: N/A;    Family History  Problem Relation Age of Onset  . Diabetes Mother   . Asthma Grandson   . Eczema Grandson   . Asthma Nephew   . Eczema Daughter     Social History:  reports that she has never smoked. She has never used smokeless tobacco. No history on file for alcohol use and drug use.  Review of Systems:  She has had diabetic retinopathy treated with laser and follows with Dr. Zadie Rhine  HYPERTENSION:  she had good control of her blood pressure with Diovan HCT, was recommended half tablet daily However she is not able to cut the 160 mg tablet in half and is taking it every other day Prescribed by PCP  BP Readings from Last 3 Encounters:  04/05/20 126/74  12/25/19 132/70  11/11/19 110/70    History of mild renal insufficiency: Creatinine is slightly higher again even though she has stopped taking etodolac  Lab Results  Component Value Date   CREATININE 1.30 (H) 03/29/2020   CREATININE 1.30 (H) 12/23/2019   CREATININE 1.20 11/07/2019    HYPERLIPIDEMIA: The lipid abnormality consists of elevated LDL.  She has good control of LDL with Crestor 69m and she is consistent on this No side effects  Lab Results  Component Value Date   CHOL 156 03/29/2020   HDL 67.30 03/29/2020   LDLCALC 72 03/29/2020   LDLDIRECT 144.0 02/18/2016   TRIG 80.0 03/29/2020   CHOLHDL 2 03/29/2020    Mild  hypothyroidism: Has been usually controlled with 50 g levothyroxine  Lab Results  Component Value Date   TSH 1.84 03/29/2020   TSH 3.09 02/06/2019   TSH 2.22 12/27/2016   FREET4 0.89 10/09/2013    Diabetic foot exam in 9/21 showed normal monofilament sensation in the toes and plantar surfaces, no skin lesions or ulcers on the feet and normal pedal pulses  Examination:   BP 126/74   Pulse 68   Ht '5\' 3"'  (1.6 m)   Wt 160 lb 12.8 oz (72.9 kg)   SpO2 98%   BMI 28.48 kg/m   Body mass index is 28.48 kg/m.     ASSESSMENT/ PLAN:   Diabetes type 2  See history of present illness for detailed discussion of current diabetes management, blood sugar patterns and problems identified  Her A1c is significantly better at 7.4  Although she has excellent blood sugar regimen of Invokamet XR 2 tablets daily her A1c is still higher than expected and may be falsely high  She has done well with lifestyle changes with regular exercise and modifying her diet Has been regular with her medications and no side effects like yeast infections  No changes recommended To check more readings after lunch which is her main meal and also on waking up To call if blood sugars are consistently high  Blood pressure is well controlled However she should talk to her PCP regarding getting Diovan 80/12.5 instead of taking the higher dose every other day  Renal insufficiency: This is mild and unchanged but not clear why creatinine is higher than usual  Urine microalbumin needs to be checked  Hyperlipidemia: Controlled on Crestor and will continue this indefinitely   Elayne Snare 04/05/2020, 11:19 AM

## 2020-04-23 ENCOUNTER — Other Ambulatory Visit: Payer: Self-pay

## 2020-04-23 ENCOUNTER — Other Ambulatory Visit: Payer: Medicare Other

## 2020-04-23 DIAGNOSIS — Z20822 Contact with and (suspected) exposure to covid-19: Secondary | ICD-10-CM

## 2020-04-25 LAB — NOVEL CORONAVIRUS, NAA: SARS-CoV-2, NAA: NOT DETECTED

## 2020-04-25 LAB — SARS-COV-2, NAA 2 DAY TAT

## 2020-04-28 ENCOUNTER — Ambulatory Visit (INDEPENDENT_AMBULATORY_CARE_PROVIDER_SITE_OTHER): Payer: 59 | Admitting: Ophthalmology

## 2020-04-28 ENCOUNTER — Encounter (INDEPENDENT_AMBULATORY_CARE_PROVIDER_SITE_OTHER): Payer: Self-pay | Admitting: Ophthalmology

## 2020-04-28 ENCOUNTER — Other Ambulatory Visit: Payer: Self-pay

## 2020-04-28 DIAGNOSIS — H35351 Cystoid macular degeneration, right eye: Secondary | ICD-10-CM | POA: Diagnosis not present

## 2020-04-28 DIAGNOSIS — H43811 Vitreous degeneration, right eye: Secondary | ICD-10-CM | POA: Diagnosis not present

## 2020-04-28 DIAGNOSIS — H2513 Age-related nuclear cataract, bilateral: Secondary | ICD-10-CM

## 2020-04-28 DIAGNOSIS — H348112 Central retinal vein occlusion, right eye, stable: Secondary | ICD-10-CM | POA: Diagnosis not present

## 2020-04-28 DIAGNOSIS — E119 Type 2 diabetes mellitus without complications: Secondary | ICD-10-CM

## 2020-04-28 NOTE — Assessment & Plan Note (Signed)
With good acuity and essentially monocular status we'll continue to observe.  However cataract extraction with intraocular lens placement the right eye will be preparatory for left eye once it is needed for visual acuity purposes

## 2020-04-28 NOTE — Assessment & Plan Note (Signed)
Stable, onset from 20 years previous, chronic CME not responsive to antivegF in the past

## 2020-04-28 NOTE — Progress Notes (Signed)
04/28/2020     CHIEF COMPLAINT Patient presents for Retina Follow Up (9 Month f\u OU. OCT/Pt states vision has been stable. C/o OU being more dry than usual. Denies FOL and floaters./ BGL: 126/ A1C: 7)   HISTORY OF PRESENT ILLNESS: Allison Matthews is a 70 y.o. female who presents to the clinic today for:   HPI    Retina Follow Up    Patient presents with  CRVO/BRVO.  In right eye.  Severity is moderate.  Duration of 9 months.  Since onset it is stable.  I, the attending physician,  performed the HPI with the patient and updated documentation appropriately. Additional comments: 9 Month f\u OU. OCT Pt states vision has been stable. C/o OU being more dry than usual. Denies FOL and floaters.  BGL: 126  A1C: 7       Last edited by Tilda Franco on 04/28/2020  2:19 PM. (History)      Referring physician: Lucianne Lei, MD [address] [place],  [place] [address]  HISTORICAL INFORMATION:   Selected notes from the MEDICAL RECORD NUMBER    Lab Results  Component Value Date   HGBA1C 7.4 (H) 03/29/2020     CURRENT MEDICATIONS: No current outpatient medications on file. (Ophthalmic Drugs)   No current facility-administered medications for this visit. (Ophthalmic Drugs)   Current Outpatient Medications (Other)  Medication Sig  . BAYER MICROLET LANCETS lancets Use as instructed to check blood sugar 2 times per day dx code E11.65  . Blood Glucose Monitoring Suppl (BAYER CONTOUR NEXT MONITOR) w/Device KIT Use to check blood sugar 2 times per day dx code E11.65  . CONTOUR NEXT TEST test strip USE AS INSTRUCTED  . desonide (DESOWEN) 0.05 % ointment Apply sparingly to affected areas on the face or neck  . Empagliflozin-metFORMIN HCl (SYNJARDY) 08-998 MG TABS Take 2 tablets by mouth daily.  Marland Kitchen levothyroxine (SYNTHROID, LEVOTHROID) 50 MCG tablet 50 mcg daily.   Marland Kitchen loratadine (CLARITIN) 10 MG tablet Take 10 mg by mouth 2 (two) times daily.  . mometasone (ELOCON) 0.1 %  ointment Apply topically daily. Apply sparingly  to affected areas as needed below face and neck  . rosuvastatin (CRESTOR) 5 MG tablet TAKE 1 TABLET BY MOUTH EVERY DAY  . triamcinolone cream (KENALOG) 0.1 % APPLY NECK DOWN 1 2 TIMES A DAY AS NEEDED FOR ITCHY RASH  . valsartan-hydrochlorothiazide (DIOVAN-HCT) 160-25 MG tablet Take 1 tablet by mouth daily.   No current facility-administered medications for this visit. (Other)      REVIEW OF SYSTEMS: ROS    Positive for: Endocrine   Last edited by Tilda Franco on 04/28/2020  2:19 PM. (History)       ALLERGIES No Known Allergies  PAST MEDICAL HISTORY Past Medical History:  Diagnosis Date  . Diabetes ([place]) 2005  . High blood pressure 1999   Past Surgical History:  Procedure Laterality Date  . ABDOMINAL HYSTERECTOMY    . GIVENS CAPSULE STUDY N/A 03/04/2018   Procedure: GIVENS CAPSULE STUDY;  Surgeon: Juanita Craver, MD;  Location: [facility] ENDOSCOPY;  Service: Endoscopy;  Laterality: N/A;    FAMILY HISTORY Family History  Problem Relation Age of Onset  . Diabetes Mother   . Asthma Grandson   . Eczema Grandson   . Asthma Nephew   . Eczema Daughter     SOCIAL HISTORY Social History   Tobacco Use  . Smoking status: Never Smoker  . Smokeless tobacco: Never Used  Vaping Use  . Vaping Use: Never used         OPHTHALMIC EXAM:  Base Eye Exam    Visual Acuity (Snellen - Linear)      Right Left   Dist cc 20/200 20/20 -1   Dist ph cc NI    Correction: Glasses       Tonometry (Tonopen, 2:23 PM)      Right Left   Pressure 19 15       Pupils      Pupils Dark Light Shape React APD   Right PERRL 4 3 Round Brisk None   Left PERRL 4 3 Round Brisk None       Visual Fields (Counting fingers)      Left Right    Full Full       Neuro/Psych    Oriented x3: Yes   Mood/Affect: Normal       Dilation    Both eyes: 1.0% Mydriacyl, 2.5% Phenylephrine @ 2:23 PM        Slit Lamp and Fundus Exam    External Exam       Right Left   External Normal Normal       Slit Lamp Exam      Right Left   Lids/Lashes Normal Normal   Conjunctiva/Sclera White and quiet White and quiet   Cornea Clear Clear   Anterior Chamber Deep and quiet Deep and quiet   Iris Round and reactive Round and reactive   Lens 3+ Nuclear sclerosis 2+ Nuclear sclerosis   Anterior Vitreous Normal Normal       Fundus Exam      Right Left   Posterior Vitreous Posterior vitreous detachment Normal   Disc collaterals Normal   C/D Ratio 0.6 0.55   Macula Cystoid macular edema, Macular thickening Normal   Vessels Old central retinal vein occlusion, Normal, no DR   Periphery  no neovascularization, Laser scars 360, extreme retinal nonperfusion, stable findings over the last 20 years Normal          IMAGING AND PROCEDURES  Imaging and Procedures for 04/28/20  OCT, Retina - OU - Both Eyes       Right Eye Quality was good. Scan locations included subfoveal. Central Foveal Thickness: 452. Progression has been stable. Findings include abnormal foveal contour, cystoid macular edema.   Left Eye Quality was good. Scan locations included subfoveal. Central Foveal Thickness: 280. Progression has been stable. Findings include normal foveal contour, vitreomacular adhesion .   Notes Incidental posterior vitreous detachment OD  Vitreal macular adhesion OS with no inner retinal changes.                  ASSESSMENT/PLAN:  Cystoid macular edema of right eye Chronic component of CRV O, which has been present for nearly 20 years OD, collateralized stable  Central retinal vein occlusion of right eye Stable, onset from 20 years previous, chronic CME not responsive to antivegF in the past  Nuclear sclerotic cataract of both eyes With good acuity and essentially monocular status we'll continue to observe.  However cataract extraction with intraocular lens placement the right eye will be preparatory for left eye once it is needed  for visual acuity purposes  Diabetes mellitus without complication (HCC) No detectable diabetic retinopathy left eye      ICD-10-CM   1. Stable central retinal vein occlusion of right eye  H34.8112 OCT, Retina - OU - Both Eyes  2. Cystoid macular edema of right eye  H35.351   3. Posterior vitreous detachment of right eye  H43.811   4. Nuclear sclerotic cataract of both eyes  H25.13   5. Diabetes mellitus without complication ([place])  N05.3     1.  OD remained stable, no neovascular complications cataract is progressing in the right eye and will 1 day require cataract traction with intraocular lens placement.  2.  Patient is still doing very well with the left eye with stable cataract.  3.  BRVO OD, not amenable to antivegF attempts to treat years past, stable now for 20 years will continue to observe  Ophthalmic Meds Ordered this visit:  No orders of the defined types were placed in this encounter.      Return in about 6 months (around 10/26/2020) for COLOR FP, OCT, DILATE OU.  There are no Patient Instructions on file for this visit.   Explained the diagnoses, plan, and follow up with the patient and they expressed understanding.  Patient expressed understanding of the importance of proper follow up care.   Clent Demark Quenten Nawaz M.D. Diseases & Surgery of the Retina and Vitreous [facility] [place] 04/28/20     Abbreviations: M myopia (nearsighted); A astigmatism; H hyperopia (farsighted); P presbyopia; Mrx spectacle prescription;  CTL contact lenses; OD right eye; OS left eye; OU both eyes  XT exotropia; ET esotropia; PEK punctate epithelial keratitis; PEE punctate epithelial erosions; DES dry eye syndrome; MGD meibomian gland dysfunction; ATs artificial tears; PFAT's preservative free artificial tears; [place] nuclear sclerotic cataract; PSC posterior subcapsular cataract; ERM epi-retinal membrane; PVD posterior vitreous detachment; RD retinal detachment; DM diabetes mellitus;  DR diabetic retinopathy; NPDR non-proliferative diabetic retinopathy; PDR proliferative diabetic retinopathy; CSME clinically significant macular edema; DME diabetic macular edema; dbh dot blot hemorrhages; CWS cotton wool spot; POAG primary open angle glaucoma; C/D cup-to-disc ratio; HVF humphrey visual field; GVF goldmann visual field; OCT optical coherence tomography; IOP intraocular pressure; BRVO Branch retinal vein occlusion; CRVO central retinal vein occlusion; CRAO central retinal artery occlusion; BRAO branch retinal artery occlusion; RT retinal tear; SB scleral buckle; PPV pars plana vitrectomy; VH Vitreous hemorrhage; PRP panretinal laser photocoagulation; IVK intravitreal kenalog; VMT vitreomacular traction; MH Macular hole;  NVD neovascularization of the disc; NVE neovascularization elsewhere; AREDS age related eye disease study; ARMD age related macular degeneration; POAG primary open angle glaucoma; EBMD epithelial/anterior basement membrane dystrophy; ACIOL anterior chamber intraocular lens; IOL intraocular lens; PCIOL posterior chamber intraocular lens; Phaco/IOL phacoemulsification with intraocular lens placement; [place] photorefractive keratectomy; LASIK laser assisted in situ keratomileusis; HTN hypertension; DM diabetes mellitus; COPD chronic obstructive pulmonary disease

## 2020-04-28 NOTE — Assessment & Plan Note (Signed)
Chronic component of CRV O, which has been present for nearly 20 years OD, collateralized stable

## 2020-04-28 NOTE — Assessment & Plan Note (Signed)
No detectable diabetic retinopathy left eye 

## 2020-05-02 ENCOUNTER — Other Ambulatory Visit: Payer: Self-pay | Admitting: Endocrinology

## 2020-05-12 ENCOUNTER — Other Ambulatory Visit: Payer: Self-pay

## 2020-05-12 ENCOUNTER — Ambulatory Visit (INDEPENDENT_AMBULATORY_CARE_PROVIDER_SITE_OTHER): Payer: Medicare Other

## 2020-05-12 ENCOUNTER — Ambulatory Visit
Admission: EM | Admit: 2020-05-12 | Discharge: 2020-05-12 | Disposition: A | Discharge status: Home / Self Care | Payer: Medicare Other | Attending: Emergency Medicine | Admitting: Emergency Medicine

## 2020-05-12 DIAGNOSIS — S20212A Contusion of left front wall of thorax, initial encounter: Secondary | ICD-10-CM

## 2020-05-12 DIAGNOSIS — R079 Chest pain, unspecified: Secondary | ICD-10-CM

## 2020-05-12 MED ORDER — TIZANIDINE HCL 2 MG PO TABS: ORAL_TABLET | ORAL | 0 refills | Status: AC

## 2020-05-12 NOTE — Discharge Instructions (Addendum)
People tend to feel worse over the next several days, but most people are back to normal in 1 week. A small number of people will have persistent pain for up to six weeks.  Start with 2 mg of Zanaflex.  May take up to 4 mg of Zanaflex 3 times a day.  Use lowest effective dose.  Also take 400 mg of ibuprofen combined with [phone] mg of Tylenol 3-4 times a day as needed.     Go to www.goodrx.com to look up your medications. This will give you a list of where you can find your prescriptions at the most affordable prices. Or ask the pharmacist what the cash price is, or if they have any other discount programs available to help make your medication more affordable. This can be less expensive than what you would pay with insurance.
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
## 2020-07-07 ENCOUNTER — Other Ambulatory Visit: Payer: Self-pay | Admitting: Endocrinology

## 2020-08-02 ENCOUNTER — Other Ambulatory Visit: Payer: 59

## 2020-08-05 ENCOUNTER — Ambulatory Visit: Payer: 59 | Admitting: Endocrinology

## 2020-08-20 ENCOUNTER — Other Ambulatory Visit: Payer: Self-pay

## 2020-08-20 ENCOUNTER — Other Ambulatory Visit (INDEPENDENT_AMBULATORY_CARE_PROVIDER_SITE_OTHER): Payer: 59

## 2020-08-20 DIAGNOSIS — E1165 Type 2 diabetes mellitus with hyperglycemia: Secondary | ICD-10-CM

## 2020-08-20 LAB — BASIC METABOLIC PANEL
BUN: 21 mg/dL (ref 6–23)
CO2: 30 mEq/L (ref 19–32)
Calcium: 10 mg/dL (ref 8.4–10.5)
Chloride: 98 mEq/L (ref 96–112)
Creatinine, Ser: 1.16 mg/dL (ref 0.40–1.20)
GFR: 47.81 mL/min — ABNORMAL LOW (ref >60.00)
Glucose, Bld: 137 mg/dL — ABNORMAL HIGH (ref 70–99)
Potassium: 4.7 mEq/L (ref 3.5–5.1)
Sodium: 137 mEq/L (ref 135–145)

## 2020-08-20 LAB — MICROALBUMIN / CREATININE URINE RATIO
Creatinine,U: 170.7 mg/dL
Microalb Creat Ratio: 1.2 mg/g (ref 0.0–30.0)
Microalb, Ur: 2.1 mg/dL — ABNORMAL HIGH (ref 0.0–1.9)

## 2020-08-20 LAB — HEMOGLOBIN A1C: Hgb A1c MFr Bld: 7.9 % — ABNORMAL HIGH (ref 4.6–6.5)

## 2020-08-24 ENCOUNTER — Encounter: Payer: Self-pay | Admitting: Endocrinology

## 2020-08-24 ENCOUNTER — Other Ambulatory Visit: Payer: Self-pay

## 2020-08-24 ENCOUNTER — Ambulatory Visit (INDEPENDENT_AMBULATORY_CARE_PROVIDER_SITE_OTHER): Payer: 59 | Admitting: Endocrinology

## 2020-08-24 VITALS — BP 106/68 | HR 66 | Ht 63.0 in | Wt 161.4 lb

## 2020-08-24 DIAGNOSIS — E782 Mixed hyperlipidemia: Secondary | ICD-10-CM

## 2020-08-24 DIAGNOSIS — E1165 Type 2 diabetes mellitus with hyperglycemia: Secondary | ICD-10-CM

## 2020-08-24 DIAGNOSIS — I10 Essential (primary) hypertension: Secondary | ICD-10-CM | POA: Diagnosis not present

## 2020-08-24 DIAGNOSIS — E063 Autoimmune thyroiditis: Secondary | ICD-10-CM

## 2020-08-24 MED ORDER — VALSARTAN 80 MG PO TABS: 80.0000 mg | ORAL_TABLET | Freq: Every day | ORAL | 3 refills | Status: DC

## 2020-08-24 MED ORDER — PIOGLITAZONE HCL 15 MG PO TABS: 15.0000 mg | ORAL_TABLET | Freq: Every day | ORAL | 1 refills | Status: DC

## 2020-08-24 NOTE — Progress Notes (Signed)
Patient ID: Allison Matthews, female   DOB: 1951-03-08, 70 y.o.   MRN: [id number]   Reason for Appointment:  follow-up   History of Present Illness   Diagnosis: Type 2 DIABETES MELITUS, date of diagnosis:  2005    She was initially treated with insulin and subsequently was transitioned to [place] XR with excellent control Previously had upper normal A1c results and had been fairly compliant with diet and exercise Since early 2014 her A1c levels had been relatively higher  Recent history:   Oral hypoglycemic drugs: Synjardy 08/998, 1 tablet twice daily  Her A1c is slightly higher at 7.9 compared to 7.4   Current management, blood sugar patterns and problems identified:  She was switched from [place] to [place] because of insurance preference this year  However not clear from her home blood sugar history why her A1c is higher  Also she does not think her blood sugars are over 150 when she checks them  She says she is not exercising now or walking because of sun sensitivity  Also is not consistent with the diet  Her weight is about the same  She is trying to eat smaller portions in the evenings and sometimes only cereal     Side effects from medications: None  Monitors blood glucose:  every other day .    Glucometer:   Contour        Blood Glucose readings from recall:   PRE-MEAL Fasting Lunch Dinner Bedtime Overall  Glucose range: 130s 125     Mean/median:     ?   POST-MEAL PC Breakfast PC Lunch PC Dinner  Glucose range:   Up to 146   Mean/median:        PRE-MEAL Fasting Lunch Dinner Bedtime Overall  Glucose range:  111-161   105    Mean/median:  133    122   POST-MEAL PC Breakfast PC Lunch PC Dinner  Glucose range:   83  95-150  Mean/median:  128                   Dietician visit: Most recent: 2009             Wt Readings from Last 3 Encounters:  08/24/20 161 lb 6.4 oz (73.2 kg)  04/05/20 160 lb 12.8 oz (72.9 kg)  12/25/19  164 lb (74.4 kg)   Diabetes Labs:  Lab Results  Component Value Date   HGBA1C 7.9 (H) 08/20/2020   HGBA1C 7.4 (H) 03/29/2020   HGBA1C 9.3 (H) 11/07/2019   Lab Results  Component Value Date   MICROALBUR 2.1 (H) 08/20/2020   LDLCALC 72 03/29/2020   CREATININE 1.16 08/20/2020    Other problems: See review of systems  Allergies as of 08/24/2020   No Known Allergies     Medication List       Accurate as of Aug 24, 2020  3:46 PM. If you have any questions, ask your nurse or doctor.        Bayer Contour Next Monitor w/Device Kit Use to check blood sugar 2 times per day dx code E11.65   Bayer Microlet Lancets lancets Use as instructed to check blood sugar 2 times per day dx code E11.65   Contour Next Test test strip Generic drug: glucose blood USE AS INSTRUCTED   desonide 0.05 % ointment Commonly known as: DESOWEN Apply sparingly to affected areas on the face or neck   levothyroxine 50 MCG tablet Commonly known as: SYNTHROID 50  mcg daily.   loratadine 10 MG tablet Commonly known as: CLARITIN Take 10 mg by mouth 2 (two) times daily.   mometasone 0.1 % ointment Commonly known as: ELOCON Apply topically daily. Apply sparingly  to affected areas as needed below face and neck   rosuvastatin 5 MG tablet Commonly known as: CRESTOR TAKE 1 TABLET BY MOUTH EVERY DAY   Synjardy 08-998 MG Tabs Generic drug: Empagliflozin-metFORMIN HCl TAKE 2 TABLETS BY MOUTH EVERY DAY   tiZANidine 2 MG tablet Commonly known as: ZANAFLEX Take 1 to 2 tablets every 8 hours as needed for muscle spasms   triamcinolone cream 0.1 % Commonly known as: KENALOG APPLY NECK DOWN 1 2 TIMES A DAY AS NEEDED FOR ITCHY RASH   valsartan-hydrochlorothiazide 160-25 MG tablet Commonly known as: DIOVAN-HCT Take 1 tablet by mouth daily.       Allergies: No Known Allergies  Past Medical History:  Diagnosis Date  . Diabetes ([place]) 2005  . High blood pressure 1999    Past Surgical History:   Procedure Laterality Date  . ABDOMINAL HYSTERECTOMY    . GIVENS CAPSULE STUDY N/A 03/04/2018   Procedure: GIVENS CAPSULE STUDY;  Surgeon: Juanita Craver, MD;  Location: [facility] ENDOSCOPY;  Service: Endoscopy;  Laterality: N/A;    Family History  Problem Relation Age of Onset  . Diabetes Mother   . Asthma Grandson   . Eczema Grandson   . Asthma Nephew   . Eczema Daughter     Social History:  reports that she has never smoked. She has never used smokeless tobacco. She reports previous alcohol use. She reports that she does not use drugs.  Review of Systems:  She has had diabetic retinopathy treated with laser and follows with Dr. Zadie Rhine  HYPERTENSION:  she had good control of her blood pressure with Diovan HCT, was recommended half tablet daily She thinks she gets tired more easily but no lightheadedness Blood pressure at home is also about 182 systolic  Prescribed by PCP  BP Readings from Last 3 Encounters:  08/24/20 106/68  05/12/20 116/77  04/05/20 126/74    History of mild renal insufficiency: Creatinine is slightly better  Previously taking etodolac  Lab Results  Component Value Date   CREATININE 1.16 08/20/2020   CREATININE 1.30 (H) 03/29/2020   CREATININE 1.30 (H) 12/23/2019    HYPERLIPIDEMIA: The lipid abnormality consists of elevated LDL.  She has good control of LDL with Crestor 17m and she is consistent on this No side effects  Lab Results  Component Value Date   CHOL 156 03/29/2020   HDL 67.30 03/29/2020   LDLCALC 72 03/29/2020   LDLDIRECT 144.0 02/18/2016   TRIG 80.0 03/29/2020   CHOLHDL 2 03/29/2020    Mild hypothyroidism: Has been usually controlled with 50 g levothyroxine  Lab Results  Component Value Date   TSH 1.84 03/29/2020   TSH 3.09 02/06/2019   TSH 2.22 12/27/2016   FREET4 0.89 10/09/2013    Diabetic foot exam in 9/21 showed normal monofilament sensation in the toes and plantar surfaces, no skin lesions or ulcers on the feet and  normal pedal pulses      Examination:   BP 106/68   Pulse 66   Ht '5\' 3"'  (1.6 m)   Wt 161 lb 6.4 oz (73.2 kg)   SpO2 95%   BMI 28.59 kg/m   Body mass index is 28.59 kg/m.     ASSESSMENT/ PLAN:   Diabetes type 2  See history of present illness  for detailed discussion of current diabetes management, blood sugar patterns and problems identified  Her A1c is back up to 7.9  Blood sugars are again lower at home compared to A1c reading and not much higher than her last visit May have done better with Invokamet but now insurance prefers [place] given she can tolerate higher doses of Jardiance because of recent episodes of Candida vaginitis She can however do better with starting an exercise program and also likely consistent diet  Recommendations today Start taking Actos 15 mg daily as she is concerned about the cost of brand-name medications when she goes on Medicare Discussed benefits and possible side effects and she will call if she has any tendency to edema Home exercises with video aerobics More consistent monitoring after meals Consistent diet   Blood pressure is low normal on Diovan HCTZ half tablet and also she is getting some sensitivity possibly from HCTZ She will now switch to valsartan 80 mg monotherapy  Renal insufficiency: This is mild and now better No microalbuminuria  Lipids to be checked on next visit   Elayne Snare 08/24/2020, 3:46 PM

## 2020-08-24 NOTE — Patient Instructions (Addendum)
Check blood sugars on waking up 3-4 days a week  Also check blood sugars about 2 hours after meals and do this after different meals by rotation  Recommended blood sugar levels on waking up are 90-130 and about 2 hours after meal is 130-160  Please bring your blood sugar monitor to each visit, thank you  Start U tube videos

## 2020-09-22 ENCOUNTER — Other Ambulatory Visit: Payer: Self-pay | Admitting: Endocrinology

## 2020-09-28 DIAGNOSIS — R21 Rash and other nonspecific skin eruption: Secondary | ICD-10-CM | POA: Diagnosis not present

## 2020-09-28 DIAGNOSIS — L810 Postinflammatory hyperpigmentation: Secondary | ICD-10-CM | POA: Diagnosis not present

## 2020-10-01 DIAGNOSIS — H40051 Ocular hypertension, right eye: Secondary | ICD-10-CM | POA: Diagnosis not present

## 2020-10-01 DIAGNOSIS — H52223 Regular astigmatism, bilateral: Secondary | ICD-10-CM | POA: Diagnosis not present

## 2020-10-01 DIAGNOSIS — H47011 Ischemic optic neuropathy, right eye: Secondary | ICD-10-CM | POA: Diagnosis not present

## 2020-10-01 DIAGNOSIS — H348312 Tributary (branch) retinal vein occlusion, right eye, stable: Secondary | ICD-10-CM | POA: Diagnosis not present

## 2020-10-01 DIAGNOSIS — H5213 Myopia, bilateral: Secondary | ICD-10-CM | POA: Diagnosis not present

## 2020-10-01 DIAGNOSIS — H5340 Unspecified visual field defects: Secondary | ICD-10-CM | POA: Diagnosis not present

## 2020-10-01 DIAGNOSIS — H524 Presbyopia: Secondary | ICD-10-CM | POA: Diagnosis not present

## 2020-10-27 ENCOUNTER — Ambulatory Visit: Payer: Medicare Other | Attending: Internal Medicine

## 2020-10-27 ENCOUNTER — Other Ambulatory Visit: Payer: Self-pay

## 2020-10-27 ENCOUNTER — Encounter (INDEPENDENT_AMBULATORY_CARE_PROVIDER_SITE_OTHER): Payer: Medicare Other | Admitting: Ophthalmology

## 2020-10-27 ENCOUNTER — Other Ambulatory Visit (HOSPITAL_BASED_OUTPATIENT_CLINIC_OR_DEPARTMENT_OTHER): Payer: Self-pay

## 2020-10-27 DIAGNOSIS — Z23 Encounter for immunization: Secondary | ICD-10-CM

## 2020-10-28 ENCOUNTER — Encounter (INDEPENDENT_AMBULATORY_CARE_PROVIDER_SITE_OTHER): Payer: Self-pay | Admitting: Ophthalmology

## 2020-10-28 ENCOUNTER — Ambulatory Visit (INDEPENDENT_AMBULATORY_CARE_PROVIDER_SITE_OTHER): Payer: Medicare Other | Admitting: Ophthalmology

## 2020-10-28 ENCOUNTER — Other Ambulatory Visit: Payer: Self-pay | Admitting: Endocrinology

## 2020-10-28 ENCOUNTER — Other Ambulatory Visit (HOSPITAL_BASED_OUTPATIENT_CLINIC_OR_DEPARTMENT_OTHER): Payer: Self-pay

## 2020-10-28 DIAGNOSIS — H2513 Age-related nuclear cataract, bilateral: Secondary | ICD-10-CM | POA: Diagnosis not present

## 2020-10-28 DIAGNOSIS — H401111 Primary open-angle glaucoma, right eye, mild stage: Secondary | ICD-10-CM

## 2020-10-28 DIAGNOSIS — H348112 Central retinal vein occlusion, right eye, stable: Secondary | ICD-10-CM

## 2020-10-28 DIAGNOSIS — H35351 Cystoid macular degeneration, right eye: Secondary | ICD-10-CM

## 2020-10-28 MED ORDER — COVID-19 MRNA VACC (MODERNA) 100 MCG/0.5ML IM SUSP
INTRAMUSCULAR | 0 refills | Status: DC
  Filled 2020-10-28: qty 0.25, 1d supply, fill #0

## 2020-10-28 MED ORDER — LATANOPROST 0.005 % OP SOLN: 1.0000 [drp] | Freq: Every day | OPHTHALMIC | 12 refills | Status: DC

## 2020-10-28 NOTE — Assessment & Plan Note (Signed)
History of retinal vascular disease, ischemic CRV O OD, the intraocular pressure does not be lower than the side.  We will thus institute therapy to lower the intraocular pressure from the current level of 22

## 2020-10-28 NOTE — Progress Notes (Signed)
10/28/2020     CHIEF COMPLAINT Patient presents for Retina Evaluation (History of ischemic CRV O with neovascularization in the past, with peripheral PRP and chronic CME with macular atrophy OD)   HISTORY OF PRESENT ILLNESS: Allison Matthews is a 70 y.o. female who presents to the clinic today for:   HPI     Retina Evaluation           Laterality: right eye   Onset: 15 years ago   MD Performed: performed the HPI with the patient and updated documentation appropriately   Comments: History of ischemic CRV O with neovascularization in the past, with peripheral PRP and chronic CME with macular atrophy OD       Last edited by Hurman Horn, MD on 10/28/2020  3:57 PM.      Referring physician: Lucianne Lei, MD [address] [place],  [place] [address]  HISTORICAL INFORMATION:   Selected notes from the MEDICAL RECORD NUMBER    Lab Results  Component Value Date   HGBA1C 7.9 (H) 08/20/2020     CURRENT MEDICATIONS: Current Outpatient Medications (Ophthalmic Drugs)  Medication Sig   latanoprost (XALATAN) 0.005 % ophthalmic solution Place 1 drop into the right eye at bedtime.   No current facility-administered medications for this visit. (Ophthalmic Drugs)   Current Outpatient Medications (Other)  Medication Sig   BAYER MICROLET LANCETS lancets Use as instructed to check blood sugar 2 times per day dx code E11.65   Blood Glucose Monitoring Suppl (BAYER CONTOUR NEXT MONITOR) w/Device KIT Use to check blood sugar 2 times per day dx code E11.65   CONTOUR NEXT TEST test strip USE AS INSTRUCTED   COVID-19 mRNA vaccine, Moderna, 100 MCG/0.5ML injection Inject into the muscle.   desonide (DESOWEN) 0.05 % ointment Apply sparingly to affected areas on the face or neck   levothyroxine (SYNTHROID, LEVOTHROID) 50 MCG tablet 50 mcg daily.    loratadine (CLARITIN) 10 MG tablet Take 10 mg by mouth 2 (two) times daily.   mometasone (ELOCON) 0.1 % ointment Apply topically daily.  Apply sparingly  to affected areas as needed below face and neck   pioglitazone (ACTOS) 15 MG tablet TAKE 1 TABLET (15 MG TOTAL) BY MOUTH DAILY.   rosuvastatin (CRESTOR) 5 MG tablet TAKE 1 TABLET BY MOUTH EVERY DAY   SYNJARDY 08-998 MG TABS TAKE 2 TABLETS BY MOUTH EVERY DAY   tiZANidine (ZANAFLEX) 2 MG tablet Take 1 to 2 tablets every 8 hours as needed for muscle spasms (Patient not taking: Reported on 08/24/2020)   triamcinolone cream (KENALOG) 0.1 % APPLY NECK DOWN 1 2 TIMES A DAY AS NEEDED FOR ITCHY RASH   valsartan (DIOVAN) 80 MG tablet Take 1 tablet (80 mg total) by mouth daily.   No current facility-administered medications for this visit. (Other)      REVIEW OF SYSTEMS:    ALLERGIES No Known Allergies  PAST MEDICAL HISTORY Past Medical History:  Diagnosis Date   Diabetes ([place]) 2005   High blood pressure 1999   Past Surgical History:  Procedure Laterality Date   ABDOMINAL HYSTERECTOMY     GIVENS CAPSULE STUDY N/A 03/04/2018   Procedure: GIVENS CAPSULE STUDY;  Surgeon: Juanita Craver, MD;  Location[facility] ENDOSCOPY;  Service: Endoscopy;  Laterality: N/A;    FAMILY HISTORY Family History  Problem Relation Age of Onset   Diabetes Mother    Asthma Grandson    Eczema Grandson    Asthma Nephew  Eczema Daughter     SOCIAL HISTORY Social History   Tobacco Use   Smoking status: Never   Smokeless tobacco: Never  Vaping Use   Vaping Use: Never used  Substance Use Topics   Alcohol use: Not Currently   Drug use: Never         OPHTHALMIC EXAM:  Base Eye Exam     Visual Acuity (ETDRS)       Right Left   Dist cc 20/150 20/15   Dist ph cc NI          Tonometry (Tonopen, 3:54 PM)       Right Left   Pressure 22 12         Pupils       Pupils APD   Right PERRL None   Left PERRL None         Visual Fields       Left Right    Full    Restrictions  Partial outer superior temporal, inferior temporal, superior nasal, inferior nasal deficiencies          Extraocular Movement       Right Left    Full, Ortho Full, Ortho         Neuro/Psych     Oriented x3: Yes   Mood/Affect: Normal         Dilation     Right eye: 1.0% Mydriacyl, 2.5% Phenylephrine @ 3:55 PM           Slit Lamp and Fundus Exam     External Exam       Right Left   External Normal Normal         Slit Lamp Exam       Right Left   Lids/Lashes Normal Normal   Conjunctiva/Sclera White and quiet White and quiet   Cornea Clear Clear   Anterior Chamber Deep and quiet Deep and quiet   Iris Round and reactive Round and reactive   Lens 3+ Nuclear sclerosis 2+ Nuclear sclerosis   Anterior Vitreous Normal Normal         Fundus Exam       Right Left   Posterior Vitreous Posterior vitreous detachment Normal   Disc collaterals Normal   C/D Ratio 0.6 0.55   Macula Cystoid macular edema, no macular thickening, with areas of nonperfusion and temporal window temporal to the macula.  But no N/V Normal   Vessels Old central retinal vein occlusion, Normal, no DR   Periphery  no neovascularization, Laser scars 360, extreme retinal nonperfusion, stable findings over the last 20 years Normal            IMAGING AND PROCEDURES  Imaging and Procedures for 10/28/20  OCT, Retina - OU - Both Eyes       Right Eye Quality was borderline. Scan locations included subfoveal. Central Foveal Thickness: 452. Progression has been stable. Findings include abnormal foveal contour, cystoid macular edema.   Left Eye Quality was good. Scan locations included subfoveal. Central Foveal Thickness: 280. Progression has been stable. Findings include normal foveal contour, vitreomacular adhesion .   Notes Incidental posterior vitreous detachment OD with chronic atrophic macular changes  Vitreal macular adhesion OS with no inner retinal changes.       Color Fundus Photography Optos - OU - Both Eyes       Right Eye Progression has been stable. Disc  findings include increased cup to disc ratio. Macula : edema.   Left Eye Progression  has been stable. Disc findings include normal observations. Macula : normal observations. Vessels : normal observations. Periphery : normal observations.   Notes Old ischemic CRV O with white lines of nonperfusion temporally.  Good peripheral PRP, no neovascularization             ASSESSMENT/PLAN:  Primary open angle glaucoma of right eye, mild stage History of retinal vascular disease, ischemic CRV O OD, the intraocular pressure does not be lower than the side.  We will thus institute therapy to lower the intraocular pressure from the current level of 22  Nuclear sclerotic cataract of both eyes Follow-up Dr. Marica Otter as scheduled     ICD-10-CM   1. Cystoid macular edema of right eye  H35.351 OCT, Retina - OU - Both Eyes    Color Fundus Photography Optos - OU - Both Eyes    2. Stable central retinal vein occlusion of right eye  H34.8112 OCT, Retina - OU - Both Eyes    Color Fundus Photography Optos - OU - Both Eyes    3. Primary open angle glaucoma of right eye, mild stage  H40.1111     4. Nuclear sclerotic cataract of both eyes  H25.13       1.  OD with new onset ocular hypertension, borderline mild stage open-angle glaucoma OS.  With a history of retinal vascular disease, vascular compromise, intraocular pressure does need to be lowered.  We will thus start with Xalatan (latanoprost) 1 drop near bedtime nightly right eye only  2.  Bilateral cataracts follow-up with Dr. Marica Otter as scheduled  3.  Dilate OU next  Ophthalmic Meds Ordered this visit:  Meds ordered this encounter  Medications   latanoprost (XALATAN) 0.005 % ophthalmic solution    Sig: Place 1 drop into the right eye at bedtime.    Dispense:  2.5 mL    Refill:  12       Return in about 6 months (around 04/30/2021) for DILATE OU, COLOR FP, OCT.  There are no Patient Instructions on file for this  visit.   Explained the diagnoses, plan, and follow up with the patient and they expressed understanding.  Patient expressed understanding of the importance of proper follow up care.   Clent Demark Karlye Ihrig M.D. Diseases & Surgery of the Retina and Vitreous [facility] Diabetic [place] 10/28/20     Abbreviations: M myopia (nearsighted); A astigmatism; H hyperopia (farsighted); P presbyopia; Mrx spectacle prescription;  CTL contact lenses; OD right eye; OS left eye; OU both eyes  XT exotropia; ET esotropia; PEK punctate epithelial keratitis; PEE punctate epithelial erosions; DES dry eye syndrome; MGD meibomian gland dysfunction; ATs artificial tears; PFAT's preservative free artificial tears; [place] nuclear sclerotic cataract; PSC posterior subcapsular cataract; ERM epi-retinal membrane; PVD posterior vitreous detachment; RD retinal detachment; DM diabetes mellitus; DR diabetic retinopathy; NPDR non-proliferative diabetic retinopathy; PDR proliferative diabetic retinopathy; CSME clinically significant macular edema; DME diabetic macular edema; dbh dot blot hemorrhages; CWS cotton wool spot; POAG primary open angle glaucoma; C/D cup-to-disc ratio; HVF humphrey visual field; GVF goldmann visual field; OCT optical coherence tomography; IOP intraocular pressure; BRVO Branch retinal vein occlusion; CRVO central retinal vein occlusion; CRAO central retinal artery occlusion; BRAO branch retinal artery occlusion; RT retinal tear; SB scleral buckle; PPV pars plana vitrectomy; VH Vitreous hemorrhage; PRP panretinal laser photocoagulation; IVK intravitreal kenalog; VMT vitreomacular traction; MH Macular hole;  NVD neovascularization of the disc; NVE neovascularization elsewhere; AREDS age related eye disease study; ARMD age related  macular degeneration; POAG primary open angle glaucoma; EBMD epithelial/anterior basement membrane dystrophy; ACIOL anterior chamber intraocular lens; IOL intraocular lens; PCIOL posterior chamber  intraocular lens; Phaco/IOL phacoemulsification with intraocular lens placement; [place] photorefractive keratectomy; LASIK laser assisted in situ keratomileusis; HTN hypertension; DM diabetes mellitus; COPD chronic obstructive pulmonary disease

## 2020-10-28 NOTE — Assessment & Plan Note (Signed)
Follow-up Dr. Blima Ledger as scheduled

## 2020-10-28 NOTE — Progress Notes (Signed)
   Covid-19 Vaccination Clinic  Name:  Allison Matthews    MRN: [id number] DOB: 09/02/1950  10/28/2020  Ms. Dickerman was observed post Covid-19 immunization for 15 minutes without incident. She was provided with Vaccine Information Sheet and instruction to access the V-Safe system.   Ms. Tipping was instructed to call 911 with any severe reactions post vaccine: Difficulty breathing  Swelling of face and throat  A fast heartbeat  A bad rash all over body  Dizziness and weakness   Immunizations Administered     Name Date Dose VIS Date Route   Moderna Covid-19 Booster Vaccine 10/27/2020  3:00 PM 0.25 mL 01/28/2020 Intramuscular   Manufacturer: Moderna   Lot: [id number]   NDC: 38101-751-02

## 2020-11-16 ENCOUNTER — Other Ambulatory Visit (INDEPENDENT_AMBULATORY_CARE_PROVIDER_SITE_OTHER): Payer: Medicare Other

## 2020-11-16 ENCOUNTER — Other Ambulatory Visit: Payer: Self-pay

## 2020-11-16 DIAGNOSIS — E782 Mixed hyperlipidemia: Secondary | ICD-10-CM

## 2020-11-16 DIAGNOSIS — E1165 Type 2 diabetes mellitus with hyperglycemia: Secondary | ICD-10-CM

## 2020-11-16 DIAGNOSIS — E063 Autoimmune thyroiditis: Secondary | ICD-10-CM

## 2020-11-16 LAB — LIPID PANEL
Cholesterol: 154 mg/dL (ref 0–200)
HDL: 67.5 mg/dL
LDL Cholesterol: 70 mg/dL (ref 0–99)
NonHDL: 86.29
Total CHOL/HDL Ratio: 2
Triglycerides: 82 mg/dL (ref 0.0–149.0)
VLDL: 16.4 mg/dL (ref 0.0–40.0)

## 2020-11-16 LAB — COMPREHENSIVE METABOLIC PANEL
ALT: 17 U/L (ref 0–35)
AST: 29 U/L (ref 0–37)
Albumin: 4.3 g/dL (ref 3.5–5.2)
Alkaline Phosphatase: 96 U/L (ref 39–117)
BUN: 12 mg/dL (ref 6–23)
CO2: 28 mEq/L (ref 19–32)
Calcium: 9.6 mg/dL (ref 8.4–10.5)
Chloride: 100 mEq/L (ref 96–112)
Creatinine, Ser: 1.18 mg/dL (ref 0.40–1.20)
GFR: 46.76 mL/min — ABNORMAL LOW (ref >60.00)
Glucose, Bld: 96 mg/dL (ref 70–99)
Potassium: 4.4 mEq/L (ref 3.5–5.1)
Sodium: 136 mEq/L (ref 135–145)
Total Bilirubin: 0.7 mg/dL (ref 0.2–1.2)
Total Protein: 7.7 g/dL (ref 6.0–8.3)

## 2020-11-16 LAB — TSH: TSH: 6.4 u[IU]/mL — ABNORMAL HIGH (ref 0.35–5.50)

## 2020-11-16 LAB — HEMOGLOBIN A1C: Hgb A1c MFr Bld: 8 % — ABNORMAL HIGH (ref 4.6–6.5)

## 2020-11-18 ENCOUNTER — Encounter: Payer: Self-pay | Admitting: Endocrinology

## 2020-11-18 ENCOUNTER — Ambulatory Visit (INDEPENDENT_AMBULATORY_CARE_PROVIDER_SITE_OTHER): Payer: Medicare Other | Admitting: Endocrinology

## 2020-11-18 ENCOUNTER — Other Ambulatory Visit: Payer: Self-pay

## 2020-11-18 VITALS — BP 142/80 | HR 67 | Ht 64.0 in | Wt 160.0 lb

## 2020-11-18 DIAGNOSIS — E1165 Type 2 diabetes mellitus with hyperglycemia: Secondary | ICD-10-CM | POA: Diagnosis not present

## 2020-11-18 DIAGNOSIS — I10 Essential (primary) hypertension: Secondary | ICD-10-CM | POA: Diagnosis not present

## 2020-11-18 DIAGNOSIS — E063 Autoimmune thyroiditis: Secondary | ICD-10-CM | POA: Diagnosis not present

## 2020-11-18 DIAGNOSIS — E782 Mixed hyperlipidemia: Secondary | ICD-10-CM | POA: Diagnosis not present

## 2020-11-18 MED ORDER — LEVOTHYROXINE SODIUM 50 MCG PO TABS
50.0000 ug | ORAL_TABLET | Freq: Every day | ORAL | 1 refills | Status: DC
Start: 2020-11-18 — End: 2021-03-21

## 2020-11-18 NOTE — Patient Instructions (Signed)
Take Actos pill in am  Check accuracy of BP meter

## 2020-11-18 NOTE — Progress Notes (Signed)
Patient ID: Allison Matthews, female   DOB: 04-20-1950, 70 y.o.   MRN: [id number]   Reason for Appointment:  follow-up   History of Present Illness   Diagnosis: Type 2 DIABETES MELITUS, date of diagnosis:  2005    She was initially treated with insulin and subsequently was transitioned to [place] XR with excellent control Previously had upper normal A1c results and had been fairly compliant with diet and exercise Since early 2014 her A1c levels had been relatively higher  Recent history:   Oral hypoglycemic drugs: Synjardy 08/998, 1 tablet twice daily  Her A1c is still about the same at 8 compared to 7.9 Lowest level has been 7.0   Current management, blood sugar patterns and problems identified: She was continued on the same dose of Synjardy in 5/22 and not increased because of history of periodic yeast infections  She was supposed to start Actos but she did not understand this and has not started it as yet  Although her blood sugars have been variable at home they are mostly closer to target in the last 10 days or so  This may be from her now being much more active and going to the gym very regularly, previously not exercising or walking  She is also able to generally be more active with not working  She tends to have relatively higher fasting readings but more recently has been as low as 110 Weight is the same She is still having some periodic yeast infection but usually controlled with Cognistat     Side effects from medications: Candida vaginitis from [place] blood glucose: About once a day.    Glucometer:   Contour        Blood Glucose readings from download:   PRE-MEAL Fasting Lunch Dinner Bedtime Overall  Glucose range: 110-155    80-224  Mean/median: 126  109  129   POST-MEAL PC Breakfast PC Lunch PC Dinner  Glucose range: 97-171  95-224  Mean/median:      Previously by recall:   PRE-MEAL Fasting Lunch Dinner Bedtime Overall   Glucose range: 130s 125     Mean/median:     ?   POST-MEAL PC Breakfast PC Lunch PC Dinner  Glucose range:   Up to 146   Mean/median:                  Dietician visit: Most recent: 2009             Wt Readings from Last 3 Encounters:  11/18/20 160 lb (72.6 kg)  08/24/20 161 lb 6.4 oz (73.2 kg)  04/05/20 160 lb 12.8 oz (72.9 kg)   Diabetes Labs:  Lab Results  Component Value Date   HGBA1C 8.0 (H) 11/16/2020   HGBA1C 7.9 (H) 08/20/2020   HGBA1C 7.4 (H) 03/29/2020   Lab Results  Component Value Date   MICROALBUR 2.1 (H) 08/20/2020   LDLCALC 70 11/16/2020   CREATININE 1.18 11/16/2020    Other problems: See review of systems  Allergies as of 11/18/2020   No Known Allergies      Medication List        Accurate as of November 18, 2020  4:03 PM. If you have any questions, ask your nurse or doctor.          Bayer Contour Next Monitor w/Device Kit Use to check blood sugar 2 times per day dx code E11.65   Bayer Microlet Lancets lancets Use as instructed to check  blood sugar 2 times per day dx code E11.65   Contour Next Test test strip Generic drug: glucose blood USE AS INSTRUCTED   desonide 0.05 % ointment Commonly known as: DESOWEN Apply sparingly to affected areas on the face or neck   latanoprost 0.005 % ophthalmic solution Commonly known as: Xalatan Place 1 drop into the right eye at bedtime.   levothyroxine 50 MCG tablet Commonly known as: SYNTHROID 50 mcg daily.   loratadine 10 MG tablet Commonly known as: CLARITIN Take 10 mg by mouth 2 (two) times daily.   Moderna COVID-19 Vaccine 100 MCG/0.5ML injection Generic drug: COVID-19 mRNA vaccine (Moderna) Inject into the muscle.   mometasone 0.1 % ointment Commonly known as: ELOCON Apply topically daily. Apply sparingly  to affected areas as needed below face and neck   pioglitazone 15 MG tablet Commonly known as: ACTOS TAKE 1 TABLET (15 MG TOTAL) BY MOUTH DAILY.   rosuvastatin 5 MG  tablet Commonly known as: CRESTOR TAKE 1 TABLET BY MOUTH EVERY DAY   Synjardy 08-998 MG Tabs Generic drug: Empagliflozin-metFORMIN HCl TAKE 2 TABLETS BY MOUTH EVERY DAY   tiZANidine 2 MG tablet Commonly known as: ZANAFLEX Take 1 to 2 tablets every 8 hours as needed for muscle spasms   triamcinolone cream 0.1 % Commonly known as: KENALOG APPLY NECK DOWN 1 2 TIMES A DAY AS NEEDED FOR ITCHY RASH   valsartan 80 MG tablet Commonly known as: DIOVAN Take 1 tablet (80 mg total) by mouth daily.        Allergies: No Known Allergies  Past Medical History:  Diagnosis Date   Diabetes ([place]) 2005   High blood pressure 1999    Past Surgical History:  Procedure Laterality Date   ABDOMINAL HYSTERECTOMY     GIVENS CAPSULE STUDY N/A 03/04/2018   Procedure: GIVENS CAPSULE STUDY;  Surgeon: Juanita Craver, MD;  Location: [facility] ENDOSCOPY;  Service: Endoscopy;  Laterality: N/A;    Family History  Problem Relation Age of Onset   Diabetes Mother    Asthma Grandson    Eczema Grandson    Asthma Nephew    Eczema Daughter     Social History:  reports that she has never smoked. She has never used smokeless tobacco. She reports that she does not currently use alcohol. She reports that she does not use drugs.  Review of Systems:  She has had diabetic retinopathy treated with laser and follows with Dr. Zadie Rhine  HYPERTENSION:  she had good control of her blood pressure with Diovan HCT, was recommended half tablet daily  Blood pressure at home is also about 496 systolic  Prescribed by PCP  BP Readings from Last 3 Encounters:  11/18/20 (!) 142/80  08/24/20 106/68  05/12/20 116/77    History of mild renal insufficiency: Creatinine is slightly better  Previously taking etodolac  Lab Results  Component Value Date   CREATININE 1.18 11/16/2020   CREATININE 1.16 08/20/2020   CREATININE 1.30 (H) 03/29/2020    HYPERLIPIDEMIA: The lipid abnormality consists of elevated LDL.  She has good  control of LDL with Crestor 48m and she is consistent on this No side effects  Lab Results  Component Value Date   CHOL 154 11/16/2020   HDL 67.50 11/16/2020   LDLCALC 70 11/16/2020   LDLDIRECT 144.0 02/18/2016   TRIG 82.0 11/16/2020   CHOLHDL 2 11/16/2020    Mild hypothyroidism: Has been usually controlled with 50 g levothyroxine Recently ran out of her refills and has not asked her  PCP for follow-up, her TSH is higher than expected with not taking it for 2 weeks  Lab Results  Component Value Date   TSH 6.40 (H) 11/16/2020   TSH 1.84 03/29/2020   TSH 3.09 02/06/2019   FREET4 0.89 10/09/2013    Diabetic foot exam in 8/22 showed normal monofilament sensation in the toes and plantar surfaces, no skin lesions or ulcers on the feet and normal pedal pulses      Examination:   BP (!) 142/80 (BP Location: Left Arm, Patient Position: Sitting, Cuff Size: Normal)   Pulse 67   Ht '5\' 4"'  (1.626 m)   Wt 160 lb (72.6 kg)   SpO2 98%   BMI 27.46 kg/m   Body mass index is 27.46 kg/m.   Blood pressure unchanged on second measurement  Diabetic Foot Exam - Simple   Simple Foot Form Diabetic Foot exam was performed with the following findings: Yes   Visual Inspection No deformities, no ulcerations, no other skin breakdown bilaterally: Yes Sensation Testing Intact to touch and monofilament testing bilaterally: Yes Pulse Check Posterior Tibialis and Dorsalis pulse intact bilaterally: Yes Comments      ASSESSMENT/ PLAN:   Diabetes type 2  See history of present illness for detailed discussion of current diabetes management, blood sugar patterns and problems identified  Her A1c is still high at 8%  She is only on Synjardy 08/998 twice daily Blood sugar however appear to be significantly better in the last few days with consistently doing an exercise program  Recommendations today Start taking Actos 15 mg in the morning; discussed that if she has any unusual swelling of her  legs she can let us know Continue same dose of Synjardy Continue regular exercise  She will let us know if she has any trend in blood sugars going up  Discussed blood sugar targets fasting and after meals  Check A1c in 3 months   Blood pressure is relatively higher but she has fairly good blood pressure readings at home  She will continue 80 mg Diovan for now but have her home monitor check with her PCP next month  Renal function stable  Lipids now controlled and she will continue Crestor 5 mg daily  HYPOTHYROIDISM: Discussed that she needs to get back on her levothyroxine supplement as TSH is starting to go up, she will continue 50 mcg as before and new prescription will be sent today   Elayne Snare 11/18/2020, 4:03 PM

## 2020-11-25 ENCOUNTER — Other Ambulatory Visit: Payer: Self-pay | Admitting: Endocrinology

## 2020-11-29 MED ORDER — ONETOUCH ULTRASOFT LANCETS MISC: 12 refills | Status: AC

## 2020-11-29 MED ORDER — ONETOUCH VERIO VI STRP: ORAL_STRIP | 3 refills | Status: DC

## 2020-12-06 DIAGNOSIS — Z1231 Encounter for screening mammogram for malignant neoplasm of breast: Secondary | ICD-10-CM | POA: Diagnosis not present

## 2020-12-06 DIAGNOSIS — Z13820 Encounter for screening for osteoporosis: Secondary | ICD-10-CM | POA: Diagnosis not present

## 2020-12-16 DIAGNOSIS — E039 Hypothyroidism, unspecified: Secondary | ICD-10-CM | POA: Diagnosis not present

## 2020-12-16 DIAGNOSIS — K581 Irritable bowel syndrome with constipation: Secondary | ICD-10-CM | POA: Diagnosis not present

## 2020-12-16 DIAGNOSIS — E1169 Type 2 diabetes mellitus with other specified complication: Secondary | ICD-10-CM | POA: Diagnosis not present

## 2020-12-16 DIAGNOSIS — I10 Essential (primary) hypertension: Secondary | ICD-10-CM | POA: Diagnosis not present

## 2020-12-16 DIAGNOSIS — Z0001 Encounter for general adult medical examination with abnormal findings: Secondary | ICD-10-CM | POA: Diagnosis not present

## 2020-12-21 DIAGNOSIS — H40011 Open angle with borderline findings, low risk, right eye: Secondary | ICD-10-CM | POA: Diagnosis not present

## 2020-12-21 DIAGNOSIS — H524 Presbyopia: Secondary | ICD-10-CM | POA: Diagnosis not present

## 2021-01-13 DIAGNOSIS — E1169 Type 2 diabetes mellitus with other specified complication: Secondary | ICD-10-CM | POA: Diagnosis not present

## 2021-01-13 DIAGNOSIS — Z23 Encounter for immunization: Secondary | ICD-10-CM | POA: Diagnosis not present

## 2021-01-13 DIAGNOSIS — I10 Essential (primary) hypertension: Secondary | ICD-10-CM | POA: Diagnosis not present

## 2021-01-28 ENCOUNTER — Other Ambulatory Visit: Payer: Self-pay

## 2021-01-28 DIAGNOSIS — E1165 Type 2 diabetes mellitus with hyperglycemia: Secondary | ICD-10-CM

## 2021-01-28 MED ORDER — CONTOUR NEXT TEST VI STRP: ORAL_STRIP | 0 refills | Status: DC

## 2021-02-03 DIAGNOSIS — I10 Essential (primary) hypertension: Secondary | ICD-10-CM | POA: Diagnosis not present

## 2021-02-03 DIAGNOSIS — E1169 Type 2 diabetes mellitus with other specified complication: Secondary | ICD-10-CM | POA: Diagnosis not present

## 2021-02-03 DIAGNOSIS — Z0001 Encounter for general adult medical examination with abnormal findings: Secondary | ICD-10-CM | POA: Diagnosis not present

## 2021-02-18 ENCOUNTER — Other Ambulatory Visit: Payer: Self-pay | Admitting: Endocrinology

## 2021-02-21 ENCOUNTER — Other Ambulatory Visit: Payer: Medicare Other

## 2021-02-21 ENCOUNTER — Other Ambulatory Visit (INDEPENDENT_AMBULATORY_CARE_PROVIDER_SITE_OTHER): Payer: Medicare Other

## 2021-02-21 DIAGNOSIS — E063 Autoimmune thyroiditis: Secondary | ICD-10-CM | POA: Diagnosis not present

## 2021-02-21 DIAGNOSIS — E1165 Type 2 diabetes mellitus with hyperglycemia: Secondary | ICD-10-CM

## 2021-02-21 LAB — HEMOGLOBIN A1C: Hgb A1c MFr Bld: 7.9 % — ABNORMAL HIGH (ref 4.6–6.5)

## 2021-02-21 LAB — BASIC METABOLIC PANEL
BUN: 20 mg/dL (ref 6–23)
CO2: 31 mEq/L (ref 19–32)
Calcium: 9.9 mg/dL (ref 8.4–10.5)
Chloride: 97 mEq/L (ref 96–112)
Creatinine, Ser: 1.29 mg/dL — ABNORMAL HIGH (ref 0.40–1.20)
GFR: 41.94 mL/min — ABNORMAL LOW (ref >60.00)
Glucose, Bld: 128 mg/dL — ABNORMAL HIGH (ref 70–99)
Potassium: 3.8 mEq/L (ref 3.5–5.1)
Sodium: 137 mEq/L (ref 135–145)

## 2021-02-21 LAB — TSH: TSH: 2.34 u[IU]/mL (ref 0.35–5.50)

## 2021-02-21 LAB — T4, FREE: Free T4: 0.98 ng/dL (ref 0.60–1.60)

## 2021-02-24 ENCOUNTER — Other Ambulatory Visit: Payer: Self-pay

## 2021-02-24 ENCOUNTER — Ambulatory Visit (INDEPENDENT_AMBULATORY_CARE_PROVIDER_SITE_OTHER): Payer: Medicare Other | Admitting: Endocrinology

## 2021-02-24 ENCOUNTER — Encounter: Payer: Self-pay | Admitting: Endocrinology

## 2021-02-24 VITALS — BP 122/72 | HR 68 | Ht 64.0 in | Wt 152.8 lb

## 2021-02-24 DIAGNOSIS — E782 Mixed hyperlipidemia: Secondary | ICD-10-CM | POA: Diagnosis not present

## 2021-02-24 DIAGNOSIS — E1165 Type 2 diabetes mellitus with hyperglycemia: Secondary | ICD-10-CM | POA: Diagnosis not present

## 2021-02-24 DIAGNOSIS — N289 Disorder of kidney and ureter, unspecified: Secondary | ICD-10-CM

## 2021-02-24 DIAGNOSIS — E063 Autoimmune thyroiditis: Secondary | ICD-10-CM

## 2021-02-24 MED ORDER — PIOGLITAZONE HCL 15 MG PO TABS: 15.0000 mg | ORAL_TABLET | Freq: Every day | ORAL | 2 refills | Status: DC

## 2021-02-24 NOTE — Progress Notes (Signed)
Patient ID: Allison Matthews, female   DOB: Nov 03, 1950, 70 y.o.   MRN: [id number]   Reason for Appointment:  follow-up   History of Present Illness   Diagnosis: Type 2 DIABETES MELITUS, date of diagnosis:  2005    She was initially treated with insulin and subsequently was transitioned to [place] XR with excellent control Previously had upper normal A1c results and had been fairly compliant with diet and exercise Since early 2014 her A1c levels had been relatively higher  Recent history:   Oral hypoglycemic drugs: Synjardy 08/998, 1 tablet twice daily  Her A1c is still about the same at 7.9 Lowest level has been 7.0   Current management, blood sugar patterns and problems identified: She was supposed to start back on her Actos again on the last visit but she thinks she took it only for a month or 2 and then forgot to continue it  She thinks her blood sugars were better when she was taking this in the mornings  Although she is eating her largest meal at lunch she has only a few readings after eating These readings are fairly good with highest reading only 167 lately Lab fasting glucose 128  She says her blood sugars may be overall higher from stress eating at times  However she is able to exercise with various activities at the gym or outside about 5 days a week  Tolerating Synjardy well, dose has been limited from higher doses of Jardiance because of yeast infections     Side effects from medications: Candida vaginitis from [place] blood glucose: About once a day.    Glucometer:   Contour        Blood Glucose readings from download:   PRE-MEAL Fasting Lunch Dinner Bedtime Overall  Glucose range: 112-151      Mean/median: 139    135   POST-MEAL PC Breakfast PC Lunch PC Dinner  Glucose range:  111-167 103-138  Mean/median:  133    Previously   PRE-MEAL Fasting Lunch Dinner Bedtime Overall  Glucose range: 110-155    80-224  Mean/median:  126  109  129   POST-MEAL PC Breakfast PC Lunch PC Dinner  Glucose range: 97-171  95-224  Mean/median:                  Dietician visit: Most recent: 2009             Wt Readings from Last 3 Encounters:  02/24/21 152 lb 12.8 oz (69.3 kg)  11/18/20 160 lb (72.6 kg)  08/24/20 161 lb 6.4 oz (73.2 kg)   Diabetes Labs:  Lab Results  Component Value Date   HGBA1C 7.9 (H) 02/21/2021   HGBA1C 8.0 (H) 11/16/2020   HGBA1C 7.9 (H) 08/20/2020   Lab Results  Component Value Date   MICROALBUR 2.1 (H) 08/20/2020   LDLCALC 70 11/16/2020   CREATININE 1.29 (H) 02/21/2021    Other problems: See review of systems  Allergies as of 02/24/2021   No Known Allergies      Medication List        Accurate as of February 24, 2021  4:48 PM. If you have any questions, ask your nurse or doctor.          Bayer Microlet Lancets lancets Use as instructed to check blood sugar 2 times per day dx code E11.65   onetouch ultrasoft lancets Use to check blood sugar twice daily.   desonide 0.05 % ointment Commonly known  as: DESOWEN Apply sparingly to affected areas on the face or neck   latanoprost 0.005 % ophthalmic solution Commonly known as: Xalatan Place 1 drop into the right eye at bedtime.   levothyroxine 50 MCG tablet Commonly known as: SYNTHROID Take 1 tablet (50 mcg total) by mouth daily.   loratadine 10 MG tablet Commonly known as: CLARITIN Take 10 mg by mouth 2 (two) times daily.   Moderna COVID-19 Vaccine 100 MCG/0.5ML injection Generic drug: COVID-19 mRNA vaccine (Moderna) Inject into the muscle.   mometasone 0.1 % ointment Commonly known as: ELOCON Apply topically daily. Apply sparingly  to affected areas as needed below face and neck   OneTouch Verio test strip Generic drug: glucose blood Use to check blood sugar twice daily.   Contour Next Test test strip Generic drug: glucose blood Use to check blood sugar twice daily   OneTouch Verio w/Device Kit Use to  check blood sugar daily   pioglitazone 15 MG tablet Commonly known as: ACTOS Take 1 tablet (15 mg total) by mouth daily.   rosuvastatin 5 MG tablet Commonly known as: CRESTOR TAKE 1 TABLET BY MOUTH EVERY DAY   Synjardy 08-998 MG Tabs Generic drug: Empagliflozin-metFORMIN HCl TAKE 2 TABLETS BY MOUTH EVERY DAY   tiZANidine 2 MG tablet Commonly known as: ZANAFLEX Take 1 to 2 tablets every 8 hours as needed for muscle spasms   triamcinolone cream 0.1 % Commonly known as: KENALOG APPLY NECK DOWN 1 2 TIMES A DAY AS NEEDED FOR ITCHY RASH   valsartan 80 MG tablet Commonly known as: DIOVAN Take 1 tablet (80 mg total) by mouth daily.        Allergies: No Known Allergies  Past Medical History:  Diagnosis Date   Diabetes ([place]) 2005   High blood pressure 1999    Past Surgical History:  Procedure Laterality Date   ABDOMINAL HYSTERECTOMY     GIVENS CAPSULE STUDY N/A 03/04/2018   Procedure: GIVENS CAPSULE STUDY;  Surgeon: Juanita Craver, MD;  Location: [facility] ENDOSCOPY;  Service: Endoscopy;  Laterality: N/A;    Family History  Problem Relation Age of Onset   Diabetes Mother    Asthma Grandson    Eczema Grandson    Asthma Nephew    Eczema Daughter     Social History:  reports that she has never smoked. She has never used smokeless tobacco. She reports that she does not currently use alcohol. She reports that she does not use drugs.  Review of Systems:  She has had diabetic retinopathy treated with laser and follows with Dr. Zadie Rhine She has not had any macular edema for some time and she has not had any injections for treatment for about 6 years apparently  HYPERTENSION: Also followed by PCP  Blood pressure at home is also checked periodically  Now taking valsartan 80 mg daily  BP Readings from Last 3 Encounters:  02/24/21 122/72  11/18/20 (!) 142/80  08/24/20 106/68    History of mild renal insufficiency: Creatinine is slightly up, was started on valsartan in  August Microalbumin has been normal  Previously taking etodolac  Lab Results  Component Value Date   CREATININE 1.29 (H) 02/21/2021   CREATININE 1.18 11/16/2020   CREATININE 1.16 08/20/2020    HYPERLIPIDEMIA: The lipid abnormality consists of elevated LDL.  She has good control of LDL with Crestor 49m and she is consistent on this No side effects  Lab Results  Component Value Date   CHOL 154 11/16/2020   HDL 67.50  11/16/2020   LDLCALC 70 11/16/2020   LDLDIRECT 144.0 02/18/2016   TRIG 82.0 11/16/2020   CHOLHDL 2 11/16/2020    Mild hypothyroidism: Has been usually controlled with 50 g levothyroxine  Her TSH was high in August when she ran out of refills but is back on levothyroxine She feels fairly good although is having some issues with depression at times  Lab Results  Component Value Date   TSH 2.34 02/21/2021   TSH 6.40 (H) 11/16/2020   TSH 1.84 03/29/2020   FREET4 0.98 02/21/2021   FREET4 0.89 10/09/2013    Diabetic foot exam in 8/22 showed normal monofilament sensation in the toes and plantar surfaces, no skin lesions or ulcers on the feet and normal pedal pulses      Examination:   BP 122/72   Pulse 68   Ht '5\' 4"'  (1.626 m)   Wt 152 lb 12.8 oz (69.3 kg)   SpO2 98%   BMI 26.23 kg/m   Body mass index is 26.23 kg/m.       ASSESSMENT/ PLAN:   Diabetes type 2  See history of present illness for detailed discussion of current diabetes management, blood sugar patterns and problems identified  Her A1c is still high at 7.9 %  She is again on only Synjardy 08/998 and did not continue Actos that was started  She reportedly had better sugars although not clear recently if she has high postprandial readings in addition to higher fasting readings also Overall however has done well with weight loss and regular exercise Also reviewed safety of Actos Since she has not had any issues with active macular edema for a few years we will give her a trial of Actos  for multiple benefits and not being a brand-name medication that she does not want  Recommendations today Start taking Actos 15 mg daily and new prescription sent She will need to start checking blood sugars by rotation after meals more often Continue same dose of Synjardy Continue regular exercise  She will let us know if her fasting readings are not improved but may consider 30 mg Actos She will also follow-up with her ophthalmologist in January  Check A1c in 3 months   Blood pressure is improved with Diovan although creatinine is slightly higher without any she will continue the same and also monitor regularly at home Potential nephrotoxic drugs  Higher but she has fairly good blood pressure readings at home  She will continue 80 mg Diovan for now but have her home monitor check with her PCP next month  Follow-up lipids on the next visit  HYPOTHYROIDISM: TSH back to normal with levothyroxine 50 mcg and she will continue as she is subjectively better   Elayne Snare 02/24/2021, 4:48 PM

## 2021-02-24 NOTE — Patient Instructions (Addendum)
Take 1 Actos 15mg   Check blood sugars on waking up  3 days a week  Also check blood sugars about 2 hours after meals and do this after different meals by rotation  Recommended blood sugar levels on waking up are 90-130 and about 2 hours after meal is 130-160  Please bring your blood sugar monitor to each visit, thank you

## 2021-03-10 DIAGNOSIS — E785 Hyperlipidemia, unspecified: Secondary | ICD-10-CM | POA: Diagnosis not present

## 2021-03-10 DIAGNOSIS — I10 Essential (primary) hypertension: Secondary | ICD-10-CM | POA: Diagnosis not present

## 2021-03-10 DIAGNOSIS — E039 Hypothyroidism, unspecified: Secondary | ICD-10-CM | POA: Diagnosis not present

## 2021-03-10 DIAGNOSIS — E1169 Type 2 diabetes mellitus with other specified complication: Secondary | ICD-10-CM | POA: Diagnosis not present

## 2021-03-20 ENCOUNTER — Other Ambulatory Visit: Payer: Self-pay | Admitting: Endocrinology

## 2021-03-21 ENCOUNTER — Encounter (HOSPITAL_BASED_OUTPATIENT_CLINIC_OR_DEPARTMENT_OTHER): Payer: Self-pay

## 2021-03-21 DIAGNOSIS — R599 Enlarged lymph nodes, unspecified: Secondary | ICD-10-CM | POA: Diagnosis not present

## 2021-03-21 DIAGNOSIS — R0683 Snoring: Secondary | ICD-10-CM

## 2021-04-13 ENCOUNTER — Other Ambulatory Visit: Payer: Self-pay

## 2021-04-13 ENCOUNTER — Ambulatory Visit (HOSPITAL_BASED_OUTPATIENT_CLINIC_OR_DEPARTMENT_OTHER): Payer: Medicare Other | Attending: Family Medicine | Admitting: Internal Medicine

## 2021-04-13 DIAGNOSIS — R0683 Snoring: Secondary | ICD-10-CM | POA: Insufficient documentation

## 2021-04-13 DIAGNOSIS — G4733 Obstructive sleep apnea (adult) (pediatric): Secondary | ICD-10-CM | POA: Insufficient documentation

## 2021-04-13 DIAGNOSIS — E1169 Type 2 diabetes mellitus with other specified complication: Secondary | ICD-10-CM | POA: Diagnosis not present

## 2021-04-13 DIAGNOSIS — E7800 Pure hypercholesterolemia, unspecified: Secondary | ICD-10-CM | POA: Diagnosis not present

## 2021-04-13 DIAGNOSIS — I10 Essential (primary) hypertension: Secondary | ICD-10-CM | POA: Diagnosis not present

## 2021-04-14 DIAGNOSIS — H40051 Ocular hypertension, right eye: Secondary | ICD-10-CM | POA: Diagnosis not present

## 2021-04-14 DIAGNOSIS — H40011 Open angle with borderline findings, low risk, right eye: Secondary | ICD-10-CM | POA: Diagnosis not present

## 2021-04-17 DIAGNOSIS — R0683 Snoring: Secondary | ICD-10-CM

## 2021-04-17 NOTE — Procedures (Signed)
° ° °  Patient Name: Allison Matthews, Allison Matthews Date: 04/13/2021 Gender: Female D.O.B: 09-12-1950 Age (years): 51 Referring Provider: Lucianne Lei Height (inches): 64 Interpreting Physician: Baird Lyons MD, ABSM Weight (lbs): 155 RPSGT: Carolin Coy BMI: 27 MRN: [id number] Neck Size: 13.00  CLINICAL INFORMATION Sleep Study Type: NPSG Indication for sleep study: Diabetes, Fatigue, Hypertension, OSA, Snoring Epworth Sleepiness Score: 7  SLEEP STUDY TECHNIQUE As per the AASM Manual for the Scoring of Sleep and Associated Events v2.3 (April 2016) with a hypopnea requiring 4% desaturations.  The channels recorded and monitored were frontal, central and occipital EEG, electrooculogram (EOG), submentalis EMG (chin), nasal and oral airflow, thoracic and abdominal wall motion, anterior tibialis EMG, snore microphone, electrocardiogram, and pulse oximetry.  MEDICATIONS Medications self-administered by patient taken the night of the study : none reported  SLEEP ARCHITECTURE The study was initiated at 10:12:02 PM and ended at 4:48:16 AM.  Sleep onset time was 21.0 minutes and the sleep efficiency was 86.8%%. The total sleep time was 344 minutes.  Stage REM latency was 71.0 minutes.  The patient spent 7.6%% of the night in stage N1 sleep, 74.0%% in stage N2 sleep, 0.0%% in stage N3 and 18.5% in REM.  Alpha intrusion was absent.  Supine sleep was 18.15%.  RESPIRATORY PARAMETERS The overall apnea/hypopnea index (AHI) was 5.1 per hour. There were 1 total apneas, including 1 obstructive, 0 central and 0 mixed apneas. There were 28 hypopneas and 87 RERAs.  The AHI during Stage REM sleep was 21.7 per hour.  AHI while supine was 16.3 per hour.  The mean oxygen saturation was 93.8%. The minimum SpO2 during sleep was 86.0%.  soft snoring was noted during this study.  CARDIAC DATA The 2 lead EKG demonstrated sinus rhythm. The mean heart rate was 72.2 beats per minute. Other EKG findings  include: None.  LEG MOVEMENT DATA The total PLMS were 0 with a resulting PLMS index of 0.0. Associated arousal with leg movement index was 0.2 .  IMPRESSIONS - Minimal obstructive sleep apnea occurred during this study (AHI = 5.1/h). - Mild oxygen desaturation was noted during this study (Min O2 = 86.0%). Mean O2 saturation 93.8%. - The patient snored with soft snoring volume. - No cardiac abnormalities were noted during this study. - Clinically significant periodic limb movements did not occur during sleep. No significant associated arousals.  DIAGNOSIS - Obstructive Sleep Apnea (G47.33)  RECOMMENDATIONS - Treatment for minimal OSA can be directed at symptoms. Conservative measures may include observation, weight loss and sleep position off back. Other options would be based on clinical judgment. - Be careful with alcohol, sedatives and other CNS depressants that may worsen sleep apnea and disrupt normal sleep architecture. - Sleep hygiene should be reviewed to assess factors that may improve sleep quality. - Weight management and regular exercise should be initiated or continued if appropriate.  [Electronically signed] 04/17/2021 12:00 PM  Baird Lyons MD, [place], American Board of Sleep Medicine   NPI: [id number]                         [place], [facility]  ELECTRONICALLY SIGNED ON:  04/17/2021, 11:57 AM [place] PH: [phone]   FX: [phone] [place]

## 2021-04-20 DIAGNOSIS — R599 Enlarged lymph nodes, unspecified: Secondary | ICD-10-CM | POA: Diagnosis not present

## 2021-04-29 ENCOUNTER — Other Ambulatory Visit: Payer: Self-pay | Admitting: Endocrinology

## 2021-05-05 ENCOUNTER — Ambulatory Visit (INDEPENDENT_AMBULATORY_CARE_PROVIDER_SITE_OTHER): Payer: Medicare Other | Admitting: Ophthalmology

## 2021-05-05 ENCOUNTER — Encounter (INDEPENDENT_AMBULATORY_CARE_PROVIDER_SITE_OTHER): Payer: Self-pay | Admitting: Ophthalmology

## 2021-05-05 ENCOUNTER — Other Ambulatory Visit: Payer: Self-pay

## 2021-05-05 DIAGNOSIS — H43811 Vitreous degeneration, right eye: Secondary | ICD-10-CM | POA: Diagnosis not present

## 2021-05-05 DIAGNOSIS — H2513 Age-related nuclear cataract, bilateral: Secondary | ICD-10-CM

## 2021-05-05 DIAGNOSIS — H401111 Primary open-angle glaucoma, right eye, mild stage: Secondary | ICD-10-CM

## 2021-05-05 DIAGNOSIS — H348112 Central retinal vein occlusion, right eye, stable: Secondary | ICD-10-CM

## 2021-05-05 DIAGNOSIS — E119 Type 2 diabetes mellitus without complications: Secondary | ICD-10-CM

## 2021-05-05 NOTE — Progress Notes (Signed)
05/05/2021     CHIEF COMPLAINT Patient presents for  Chief Complaint  Patient presents with   Retina Follow Up      HISTORY OF PRESENT ILLNESS: Allison Matthews is a 71 y.o. female who presents to the clinic today for:   HPI     Retina Follow Up           Diagnosis: Other   Laterality: both eyes   Onset: 6 months ago   Severity: mild   Duration: 6 months   Course: stable         Comments   6 month fu ou. OCT and FP  Pt states VA OU stable since last visit. Pt denies FOL, floaters, or ocular pain OU.  Pt states, "My vision seems to be about the same but my right eye gets dry since I have had to use the pressure drop."  Pt reports using Latanoprost QHS OD        Last edited by Kendra Opitz, COA on 05/05/2021  3:53 PM.      Referring physician: Marica Otter, OD [address],  [place] [address]  HISTORICAL INFORMATION:   Selected notes from the MEDICAL RECORD NUMBER    Lab Results  Component Value Date   HGBA1C 7.9 (H) 02/21/2021     CURRENT MEDICATIONS: Current Outpatient Medications (Ophthalmic Drugs)  Medication Sig   latanoprost (XALATAN) 0.005 % ophthalmic solution Place 1 drop into the right eye at bedtime. (Patient not taking: Reported on 02/24/2021)   No current facility-administered medications for this visit. (Ophthalmic Drugs)   Current Outpatient Medications (Other)  Medication Sig   BAYER MICROLET LANCETS lancets Use as instructed to check blood sugar 2 times per day dx code E11.65   Blood Glucose Monitoring Suppl (ONETOUCH VERIO) w/Device KIT Use to check blood sugar daily   COVID-19 mRNA vaccine, Moderna, 100 MCG/0.5ML injection Inject into the muscle.   desonide (DESOWEN) 0.05 % ointment Apply sparingly to affected areas on the face or neck   glucose blood (CONTOUR NEXT TEST) test strip Use to check blood sugar twice daily   glucose blood (ONETOUCH VERIO) test strip Use to check blood sugar twice daily.    Lancets (ONETOUCH ULTRASOFT) lancets Use to check blood sugar twice daily.   levothyroxine (SYNTHROID) 50 MCG tablet TAKE 1 TABLET BY MOUTH EVERY DAY   loratadine (CLARITIN) 10 MG tablet Take 10 mg by mouth 2 (two) times daily. (Patient not taking: Reported on 02/24/2021)   mometasone (ELOCON) 0.1 % ointment Apply topically daily. Apply sparingly  to affected areas as needed below face and neck   pioglitazone (ACTOS) 15 MG tablet Take 1 tablet (15 mg total) by mouth daily.   rosuvastatin (CRESTOR) 5 MG tablet TAKE 1 TABLET BY MOUTH EVERY DAY   SYNJARDY 08-998 MG TABS TAKE 2 TABLETS BY MOUTH EVERY DAY   tiZANidine (ZANAFLEX) 2 MG tablet Take 1 to 2 tablets every 8 hours as needed for muscle spasms (Patient not taking: Reported on 02/24/2021)   triamcinolone cream (KENALOG) 0.1 % APPLY NECK DOWN 1 2 TIMES A DAY AS NEEDED FOR ITCHY RASH   valsartan (DIOVAN) 80 MG tablet Take 1 tablet (80 mg total) by mouth daily.   No current facility-administered medications for this visit. (Other)      REVIEW OF SYSTEMS:    ALLERGIES No Known Allergies  PAST MEDICAL HISTORY Past Medical History:  Diagnosis Date   Diabetes ([place]) 2005  High blood pressure 1999   Past Surgical History:  Procedure Laterality Date   ABDOMINAL HYSTERECTOMY     GIVENS CAPSULE STUDY N/A 03/04/2018   Procedure: GIVENS CAPSULE STUDY;  Surgeon: Juanita Craver, MD;  Location: [facility] ENDOSCOPY;  Service: Endoscopy;  Laterality: N/A;    FAMILY HISTORY Family History  Problem Relation Age of Onset   Diabetes Mother    Asthma Grandson    Eczema Grandson    Asthma Nephew    Eczema Daughter     SOCIAL HISTORY Social History   Tobacco Use   Smoking status: Never   Smokeless tobacco: Never  Vaping Use   Vaping Use: Never used  Substance Use Topics   Alcohol use: Not Currently   Drug use: Never         OPHTHALMIC EXAM:  Base Eye Exam     Visual Acuity (ETDRS)       Right Left   Dist cc 20/150 20/15    Dist ph cc NI     Correction: Glasses         Tonometry (Tonopen, 3:57 PM)       Right Left   Pressure 19 20         Pupils       Pupils Dark Light Shape React APD   Right PERRL 4 3 Round Brisk None   Left PERRL 4 3 Round Brisk None         Visual Fields       Left Right    Full    Restrictions  Partial outer superior temporal, inferior temporal, superior nasal, inferior nasal deficiencies         Extraocular Movement       Right Left    Full, Ortho Full, Ortho         Neuro/Psych     Oriented x3: Yes   Mood/Affect: Normal         Dilation     Both eyes: 1.0% Mydriacyl, 2.5% Phenylephrine @ 3:56 PM           Slit Lamp and Fundus Exam     External Exam       Right Left   External Normal Normal         Slit Lamp Exam       Right Left   Lids/Lashes Normal Normal   Conjunctiva/Sclera White and quiet White and quiet   Cornea Clear Clear   Anterior Chamber Deep and quiet Deep and quiet   Iris Round and reactive Round and reactive   Lens 3+ Nuclear sclerosis 2+ Nuclear sclerosis   Anterior Vitreous Normal Normal         Fundus Exam       Right Left   Posterior Vitreous Posterior vitreous detachment Normal   Disc collaterals Normal   C/D Ratio 0.6 0.55   Macula Cystoid macular edema, no macular thickening, with areas of nonperfusion and temporal window temporal to the macula.  But no N/V Normal   Vessels Old central retinal vein occlusion, Normal, no DR   Periphery  no neovascularization, Laser scars 360, extreme retinal nonperfusion, stable findings over the last 20 years Normal            IMAGING AND PROCEDURES  Imaging and Procedures for 05/05/21  OCT, Retina - OU - Both Eyes       Right Eye Quality was borderline. Scan locations included subfoveal. Central Foveal Thickness: 281. Progression has been stable. Findings include abnormal  foveal contour, cystoid macular edema.   Left Eye Quality was good. Scan locations  included subfoveal. Central Foveal Thickness: 282. Progression has been stable. Findings include normal foveal contour, vitreomacular adhesion .   Notes Incidental posterior vitreous detachment OD with chronic atrophic macular changes, chronic CME not active will observe  Vitreal macular adhesion OS with no inner retinal changes.       Color Fundus Photography Optos - OU - Both Eyes       Right Eye Progression has been stable. Disc findings include increased cup to disc ratio. Macula : edema.   Left Eye Progression has been stable. Disc findings include normal observations. Macula : normal observations. Vessels : normal observations. Periphery : normal observations.   Notes Old ischemic CRV O with white lines of nonperfusion temporally.  Good peripheral PRP, no neovascularization               ASSESSMENT/PLAN:  Nuclear sclerotic cataract of both eyes Under the care of monitoring Dr. Marica Otter.  OD can proceed with cataract surgery anytime vision acuity will be limited by the prior ischemic CRV O which is now atrophic with only minor residual CME which is not responsive to therapy and thus requires only observation  Diabetes mellitus without complication ([place]) No detectable diabetic retinopathy left eye  Central retinal vein occlusion of right eye Condition quiet OD, CME not active diffuse atrophy remains  Primary open angle glaucoma of right eye, mild stage Continue on topical medication simply to protect the compromised optic nerve OD     ICD-10-CM   1. Stable central retinal vein occlusion of right eye  H34.8112 OCT, Retina - OU - Both Eyes    Color Fundus Photography Optos - OU - Both Eyes    2. Posterior vitreous detachment of right eye  H43.811 OCT, Retina - OU - Both Eyes    Color Fundus Photography Optos - OU - Both Eyes    3. Nuclear sclerotic cataract of both eyes  H25.13     4. Diabetes mellitus without complication ([place])  M46.8     5. Primary  open angle glaucoma of right eye, mild stage  H40.1111       1.  OD, chronic stable CRV O with residual atrophic CME and atrophic retinal otherwise stable  2.  Compromised optic nerve with collateralization of the nerve from the underlying CRV O on topical 1 drop therapy for open-angle glaucoma due to optic nerve perfusion compromise.  Stable over time.  Will likely improve once cataract surgery is performed OD.  3.  Cataract progression OU moderate, no symptoms in the left eye with excellent acuity.  Continue to observe under the care of Dr. Marica Otter  Ophthalmic Meds Ordered this visit:  No orders of the defined types were placed in this encounter.      Return in about 9 months (around 02/02/2022) for DILATE OU, COLOR FP, OCT, OD, OS.  There are no Patient Instructions on file for this visit.   Explained the diagnoses, plan, and follow up with the patient and they expressed understanding.  Patient expressed understanding of the importance of proper follow up care.   Clent Demark Anaily Ashbaugh M.D. Diseases & Surgery of the Retina and Vitreous Retina [facility] Diabetic [place] 05/05/21     Abbreviations: M myopia (nearsighted); A astigmatism; H hyperopia (farsighted); P presbyopia; Mrx spectacle prescription;  CTL contact lenses; OD right eye; OS left eye; OU both eyes  XT exotropia; ET esotropia; PEK  punctate epithelial keratitis; PEE punctate epithelial erosions; DES dry eye syndrome; MGD meibomian gland dysfunction; ATs artificial tears; PFAT's preservative free artificial tears; [place] nuclear sclerotic cataract; PSC posterior subcapsular cataract; ERM epi-retinal membrane; PVD posterior vitreous detachment; RD retinal detachment; DM diabetes mellitus; DR diabetic retinopathy; NPDR non-proliferative diabetic retinopathy; PDR proliferative diabetic retinopathy; CSME clinically significant macular edema; DME diabetic macular edema; dbh dot blot hemorrhages; CWS cotton wool spot; POAG primary open  angle glaucoma; C/D cup-to-disc ratio; HVF humphrey visual field; GVF goldmann visual field; OCT optical coherence tomography; IOP intraocular pressure; BRVO Branch retinal vein occlusion; CRVO central retinal vein occlusion; CRAO central retinal artery occlusion; BRAO branch retinal artery occlusion; RT retinal tear; SB scleral buckle; PPV pars plana vitrectomy; VH Vitreous hemorrhage; PRP panretinal laser photocoagulation; IVK intravitreal kenalog; VMT vitreomacular traction; MH Macular hole;  NVD neovascularization of the disc; NVE neovascularization elsewhere; AREDS age related eye disease study; ARMD age related macular degeneration; POAG primary open angle glaucoma; EBMD epithelial/anterior basement membrane dystrophy; ACIOL anterior chamber intraocular lens; IOL intraocular lens; PCIOL posterior chamber intraocular lens; Phaco/IOL phacoemulsification with intraocular lens placement; [place] photorefractive keratectomy; LASIK laser assisted in situ keratomileusis; HTN hypertension; DM diabetes mellitus; COPD chronic obstructive pulmonary disease

## 2021-05-05 NOTE — Assessment & Plan Note (Signed)
No detectable diabetic retinopathy left eye 

## 2021-05-05 NOTE — Assessment & Plan Note (Signed)
Continue on topical medication simply to protect the compromised optic nerve OD

## 2021-05-05 NOTE — Assessment & Plan Note (Signed)
Under the care of monitoring Dr. Blima Ledger.  OD can proceed with cataract surgery anytime vision acuity will be limited by the prior ischemic CRV O which is now atrophic with only minor residual CME which is not responsive to therapy and thus requires only observation

## 2021-05-05 NOTE — Assessment & Plan Note (Signed)
Condition quiet OD, CME not active diffuse atrophy remains

## 2021-05-11 ENCOUNTER — Other Ambulatory Visit: Payer: Self-pay

## 2021-05-11 DIAGNOSIS — E1165 Type 2 diabetes mellitus with hyperglycemia: Secondary | ICD-10-CM

## 2021-05-11 MED ORDER — ONETOUCH VERIO VI STRP: ORAL_STRIP | 3 refills | Status: DC

## 2021-06-02 ENCOUNTER — Ambulatory Visit: Payer: Medicare Other | Attending: Internal Medicine

## 2021-06-02 ENCOUNTER — Other Ambulatory Visit (HOSPITAL_BASED_OUTPATIENT_CLINIC_OR_DEPARTMENT_OTHER): Payer: Self-pay

## 2021-06-02 DIAGNOSIS — Z23 Encounter for immunization: Secondary | ICD-10-CM

## 2021-06-02 MED ORDER — MODERNA COVID-19 BIVAL BOOSTER 50 MCG/0.5ML IM SUSP
INTRAMUSCULAR | 0 refills | Status: DC
  Filled 2021-06-02: qty 0.5, 1d supply, fill #0

## 2021-06-02 NOTE — Progress Notes (Signed)
° °  Covid-19 Vaccination Clinic  Name:  Kande Dillahunt    MRN: [id number] DOB: [birth year]  06/02/2021  Ms. Hatlestad was observed post Covid-19 immunization for 15 minutes without incident. She was provided with Vaccine Information Sheet and instruction to access the V-Safe system.   Ms. Escutia was instructed to call 911 with any severe reactions post vaccine: Difficulty breathing  Swelling of face and throat  A fast heartbeat  A bad rash all over body  Dizziness and weakness   Immunizations Administered     Name Date Dose VIS Date Route   Moderna Covid-19 vaccine Bivalent Booster 06/02/2021  3:15 PM 0.5 mL 11/20/2020 Intramuscular   Manufacturer: Levan Hurst   Lot: [id number]   [id number]

## 2021-06-07 ENCOUNTER — Other Ambulatory Visit: Payer: Medicare Other

## 2021-06-07 ENCOUNTER — Other Ambulatory Visit: Payer: Self-pay

## 2021-06-07 ENCOUNTER — Other Ambulatory Visit (INDEPENDENT_AMBULATORY_CARE_PROVIDER_SITE_OTHER): Payer: Medicare Other

## 2021-06-07 DIAGNOSIS — E782 Mixed hyperlipidemia: Secondary | ICD-10-CM | POA: Diagnosis not present

## 2021-06-07 DIAGNOSIS — E1165 Type 2 diabetes mellitus with hyperglycemia: Secondary | ICD-10-CM | POA: Diagnosis not present

## 2021-06-07 LAB — COMPREHENSIVE METABOLIC PANEL
ALT: 14 U/L (ref 0–35)
AST: 26 U/L (ref 0–37)
Albumin: 4.5 g/dL (ref 3.5–5.2)
Alkaline Phosphatase: 95 U/L (ref 39–117)
BUN: 15 mg/dL (ref 6–23)
CO2: 29 mEq/L (ref 19–32)
Calcium: 10.1 mg/dL (ref 8.4–10.5)
Chloride: 98 mEq/L (ref 96–112)
Creatinine, Ser: 1.18 mg/dL (ref 0.40–1.20)
GFR: 46.58 mL/min — ABNORMAL LOW (ref >60.00)
Glucose, Bld: 77 mg/dL (ref 70–99)
Potassium: 4.2 mEq/L (ref 3.5–5.1)
Sodium: 136 mEq/L (ref 135–145)
Total Bilirubin: 0.9 mg/dL (ref 0.2–1.2)
Total Protein: 7.6 g/dL (ref 6.0–8.3)

## 2021-06-07 LAB — LIPID PANEL
Cholesterol: 185 mg/dL (ref 0–200)
HDL: 85 mg/dL (ref >39.00)
LDL Cholesterol: 87 mg/dL (ref 0–99)
NonHDL: 100.12
Total CHOL/HDL Ratio: 2
Triglycerides: 68 mg/dL (ref 0.0–149.0)
VLDL: 13.6 mg/dL (ref 0.0–40.0)

## 2021-06-07 LAB — HEMOGLOBIN A1C: Hgb A1c MFr Bld: 6.9 % — ABNORMAL HIGH (ref 4.6–6.5)

## 2021-06-09 ENCOUNTER — Other Ambulatory Visit: Payer: Self-pay

## 2021-06-09 ENCOUNTER — Encounter: Payer: Self-pay | Admitting: Endocrinology

## 2021-06-09 ENCOUNTER — Ambulatory Visit: Payer: Medicare Other | Admitting: Endocrinology

## 2021-06-09 VITALS — BP 124/64 | HR 67 | Ht 64.0 in | Wt 158.0 lb

## 2021-06-09 DIAGNOSIS — E063 Autoimmune thyroiditis: Secondary | ICD-10-CM

## 2021-06-09 DIAGNOSIS — E119 Type 2 diabetes mellitus without complications: Secondary | ICD-10-CM | POA: Diagnosis not present

## 2021-06-09 DIAGNOSIS — E782 Mixed hyperlipidemia: Secondary | ICD-10-CM

## 2021-06-09 DIAGNOSIS — I10 Essential (primary) hypertension: Secondary | ICD-10-CM

## 2021-06-09 NOTE — Patient Instructions (Signed)
g

## 2021-06-09 NOTE — Progress Notes (Signed)
Patient ID: Allison Matthews, female   DOB: 04-01-1951, 71 y.o.   MRN: [id number]   Reason for Appointment:  follow-up   History of Present Illness    DIABETES:  Diagnosis: Type 2 DIABETES MELITUS, date of diagnosis:  2005    She was initially treated with insulin and subsequently was transitioned to Kombiglyze XR with excellent control Previously had upper normal A1c results and had been fairly compliant with diet and exercise Since early 2014 her A1c levels had been relatively higher  Recent history:   Oral hypoglycemic drugs: Synjardy 08/998, 1 tablet twice daily, Actos 15 mg daily  Her A1c is significantly improved at 6.9, previously was about the same at 7.9 Lowest level has been 7.0 previously   Current management, blood sugar patterns and problems identified: She finally started her Actos on the last visit and she has taken this consistently  No side effects like edema and her no recent eye exam was stable Checking blood sugars at different times of the day and they seem to be fairly evenly controlled  Compared to her last visit her fasting blood sugars appear to be relatively better  Overall she thinks she has had less consistent diet with occasional stress eating likely from winter blues However she does not have increased appetite at all times and at times may have less appetite  With this she has gained weight  Lab glucose was 77 midday Synjardy dose has been limited from higher doses of Jardiance because of yeast infections     Side effects from medications: Candida vaginitis from [place] blood glucose: About once a day.    Glucometer:   Contour        Blood Glucose readings from download:   PRE-MEAL Fasting Lunch Dinner Bedtime Overall  Glucose range: 93-146    81-155  Mean/median: 110    117   POST-MEAL PC Breakfast PC Lunch PC Dinner  Glucose range:     Mean/median:   140   Her  PRE-MEAL Fasting Lunch Dinner Bedtime  Overall  Glucose range: 112-151      Mean/median: 139    135   POST-MEAL PC Breakfast PC Lunch PC Dinner  Glucose range:  111-167 103-138  Mean/median:  133               Dietician visit: Most recent: 2009             Wt Readings from Last 3 Encounters:  06/09/21 158 lb (71.7 kg)  04/13/21 155 lb (70.3 kg)  02/24/21 152 lb 12.8 oz (69.3 kg)   Diabetes Labs:  Lab Results  Component Value Date   HGBA1C 6.9 (H) 06/07/2021   HGBA1C 7.9 (H) 02/21/2021   HGBA1C 8.0 (H) 11/16/2020   Lab Results  Component Value Date   MICROALBUR 2.1 (H) 08/20/2020   LDLCALC 87 06/07/2021   CREATININE 1.18 06/07/2021    Other active problems addressed today: See review of systems  Allergies as of 06/09/2021   No Known Allergies      Medication List        Accurate as of June 09, 2021  8:44 PM. If you have any questions, ask your nurse or doctor.          Bayer Microlet Lancets lancets Use as instructed to check blood sugar 2 times per day dx code E11.65   onetouch ultrasoft lancets Use to check blood sugar twice daily.   desonide 0.05 % ointment Commonly  known as: DESOWEN Apply sparingly to affected areas on the face or neck   latanoprost 0.005 % ophthalmic solution Commonly known as: Xalatan Place 1 drop into the right eye at bedtime.   levothyroxine 50 MCG tablet Commonly known as: SYNTHROID TAKE 1 TABLET BY MOUTH EVERY DAY   loratadine 10 MG tablet Commonly known as: CLARITIN Take 10 mg by mouth 2 (two) times daily.   Moderna COVID-19 Bival Booster 50 MCG/0.5ML injection Generic drug: COVID-19 mRNA bivalent vaccine (Moderna) Inject into the muscle.   Moderna COVID-19 Vaccine 100 MCG/0.5ML injection Generic drug: COVID-19 mRNA vaccine (Moderna) Inject into the muscle.   mometasone 0.1 % ointment Commonly known as: ELOCON Apply topically daily. Apply sparingly  to affected areas as needed below face and neck   OneTouch Verio test strip Generic drug: glucose  blood Use to check blood sugar twice daily.   OneTouch Verio w/Device Kit Use to check blood sugar daily   pioglitazone 15 MG tablet Commonly known as: ACTOS Take 1 tablet (15 mg total) by mouth daily.   rosuvastatin 5 MG tablet Commonly known as: CRESTOR TAKE 1 TABLET BY MOUTH EVERY DAY   Synjardy 08-998 MG Tabs Generic drug: Empagliflozin-metFORMIN HCl TAKE 2 TABLETS BY MOUTH EVERY DAY   tiZANidine 2 MG tablet Commonly known as: ZANAFLEX Take 1 to 2 tablets every 8 hours as needed for muscle spasms   triamcinolone cream 0.1 % Commonly known as: KENALOG APPLY NECK DOWN 1 2 TIMES A DAY AS NEEDED FOR ITCHY RASH   valsartan 80 MG tablet Commonly known as: DIOVAN Take 1 tablet (80 mg total) by mouth daily.        Allergies: No Known Allergies  Past Medical History:  Diagnosis Date   Diabetes ([place]) 2005   High blood pressure 1999    Past Surgical History:  Procedure Laterality Date   ABDOMINAL HYSTERECTOMY     GIVENS CAPSULE STUDY N/A 03/04/2018   Procedure: GIVENS CAPSULE STUDY;  Surgeon: Juanita Craver, MD;  Location: [facility] ENDOSCOPY;  Service: Endoscopy;  Laterality: N/A;    Family History  Problem Relation Age of Onset   Diabetes Mother    Asthma Grandson    Eczema Grandson    Asthma Nephew    Eczema Daughter     Social History:  reports that she has never smoked. She has never used smokeless tobacco. She reports that she does not currently use alcohol. She reports that she does not use drugs.  Review of Systems:  She has had diabetic retinopathy treated with laser and follows with Dr. Zadie Rhine She has not had any macular edema for some time and she has not had any injections for treatment for about 6 years apparently  HYPERTENSION: Now taking valsartan 80 mg daily, Also followed by PCP   blood pressure was low normal with taking HCTZ also  BP Readings from Last 3 Encounters:  06/09/21 124/64  02/24/21 122/72  11/18/20 (!) 142/80    History of mild  renal insufficiency: Creatinine is better with stopping HCTZ  Microalbumin has been normal  Previously taking etodolac  Lab Results  Component Value Date   CREATININE 1.18 06/07/2021   CREATININE 1.29 (H) 02/21/2021   CREATININE 1.18 11/16/2020    HYPERLIPIDEMIA: The lipid abnormality consists of elevated LDL.  She has good control of LDL with Crestor 46m and she is consistent on this No side effects  Lab Results  Component Value Date   CHOL 185 06/07/2021   HDL 85.00 06/07/2021  LDLCALC 87 06/07/2021   LDLDIRECT 144.0 02/18/2016   TRIG 68.0 06/07/2021   CHOLHDL 2 06/07/2021    Mild hypothyroidism: Has been usually controlled with 50 g levothyroxine  Her TSH was high in 8/22 when she ran out of refills  This is now back to normal with the same dose   Lab Results  Component Value Date   TSH 2.34 02/21/2021   TSH 6.40 (H) 11/16/2020   TSH 1.84 03/29/2020   FREET4 0.98 02/21/2021   FREET4 0.89 10/09/2013    Diabetic foot exam in 8/22 showed normal monofilament sensation in the toes and plantar surfaces, no skin lesions or ulcers on the feet and normal pedal pulses  She tends to have seasonal depression in winter but has not taken any medications as recommended by her PCP  Last eye exam: 1/23      Examination:   BP 124/64    Pulse 67    Ht _0  (1.626 m)    Wt 158 lb (71.7 kg)    SpO2 99%    BMI 27.12 kg/m   Body mass index is 27.12 kg/m.       ASSESSMENT/ PLAN:   Diabetes type 2  See history of present illness for detailed discussion of current diabetes management, blood sugar patterns and problems identified  Her A1c is at 6.9 which is better than usual  She is on Actos now along with her Synjardy 08/998, 2 tablets daily Blood sugars have consistently improved including at home No recent side effects of Actos She thinks her weight gain is related to inconsistent diet She is continuing to exercise  She will continue the same regimen and also  check blood sugars at different times of the day after meals and fasting by rotation   Blood pressure is controlled on 80 mg valsartan only  Hypercholesterolemia: Well-controlled with 5 mg rosuvastatin which she will continue   Elayne Snare 06/09/2021, 8:44 PM

## 2021-07-13 DIAGNOSIS — R21 Rash and other nonspecific skin eruption: Secondary | ICD-10-CM | POA: Diagnosis not present

## 2021-07-13 DIAGNOSIS — E1169 Type 2 diabetes mellitus with other specified complication: Secondary | ICD-10-CM | POA: Diagnosis not present

## 2021-07-13 DIAGNOSIS — G4730 Sleep apnea, unspecified: Secondary | ICD-10-CM | POA: Diagnosis not present

## 2021-07-13 DIAGNOSIS — M130 Polyarthritis, unspecified: Secondary | ICD-10-CM | POA: Diagnosis not present

## 2021-07-13 DIAGNOSIS — E039 Hypothyroidism, unspecified: Secondary | ICD-10-CM | POA: Diagnosis not present

## 2021-07-13 DIAGNOSIS — I10 Essential (primary) hypertension: Secondary | ICD-10-CM | POA: Diagnosis not present

## 2021-07-23 ENCOUNTER — Other Ambulatory Visit: Payer: Self-pay | Admitting: Endocrinology

## 2021-08-01 ENCOUNTER — Other Ambulatory Visit: Payer: Self-pay | Admitting: Endocrinology

## 2021-08-18 DIAGNOSIS — G4733 Obstructive sleep apnea (adult) (pediatric): Secondary | ICD-10-CM | POA: Diagnosis not present

## 2021-08-18 DIAGNOSIS — I10 Essential (primary) hypertension: Secondary | ICD-10-CM | POA: Diagnosis not present

## 2021-08-18 DIAGNOSIS — R0683 Snoring: Secondary | ICD-10-CM | POA: Diagnosis not present

## 2021-09-15 ENCOUNTER — Other Ambulatory Visit: Payer: Self-pay | Admitting: Endocrinology

## 2021-09-18 DIAGNOSIS — G4733 Obstructive sleep apnea (adult) (pediatric): Secondary | ICD-10-CM | POA: Diagnosis not present

## 2021-09-18 DIAGNOSIS — R0683 Snoring: Secondary | ICD-10-CM | POA: Diagnosis not present

## 2021-09-18 DIAGNOSIS — I10 Essential (primary) hypertension: Secondary | ICD-10-CM | POA: Diagnosis not present

## 2021-10-07 ENCOUNTER — Other Ambulatory Visit (INDEPENDENT_AMBULATORY_CARE_PROVIDER_SITE_OTHER): Payer: Medicare Other

## 2021-10-07 DIAGNOSIS — E119 Type 2 diabetes mellitus without complications: Secondary | ICD-10-CM

## 2021-10-07 DIAGNOSIS — E063 Autoimmune thyroiditis: Secondary | ICD-10-CM | POA: Diagnosis not present

## 2021-10-07 LAB — BASIC METABOLIC PANEL
BUN: 19 mg/dL (ref 6–23)
CO2: 29 mEq/L (ref 19–32)
Calcium: 10.1 mg/dL (ref 8.4–10.5)
Chloride: 101 mEq/L (ref 96–112)
Creatinine, Ser: 1.27 mg/dL — ABNORMAL HIGH (ref 0.40–1.20)
GFR: 42.55 mL/min — ABNORMAL LOW (ref >60.00)
Glucose, Bld: 124 mg/dL — ABNORMAL HIGH (ref 70–99)
Potassium: 4.3 mEq/L (ref 3.5–5.1)
Sodium: 139 mEq/L (ref 135–145)

## 2021-10-07 LAB — HEMOGLOBIN A1C: Hgb A1c MFr Bld: 7 % — ABNORMAL HIGH (ref 4.6–6.5)

## 2021-10-07 LAB — TSH: TSH: 1.74 u[IU]/mL (ref 0.35–5.50)

## 2021-10-14 ENCOUNTER — Ambulatory Visit: Payer: Medicare Other | Admitting: Endocrinology

## 2021-10-14 ENCOUNTER — Encounter: Payer: Self-pay | Admitting: Endocrinology

## 2021-10-14 VITALS — BP 118/70 | HR 79 | Ht 64.0 in | Wt 162.0 lb

## 2021-10-14 DIAGNOSIS — E782 Mixed hyperlipidemia: Secondary | ICD-10-CM | POA: Diagnosis not present

## 2021-10-14 DIAGNOSIS — E063 Autoimmune thyroiditis: Secondary | ICD-10-CM | POA: Diagnosis not present

## 2021-10-14 DIAGNOSIS — E119 Type 2 diabetes mellitus without complications: Secondary | ICD-10-CM | POA: Diagnosis not present

## 2021-10-14 NOTE — Progress Notes (Signed)
Patient ID: Allison Matthews, female   DOB: 22-Apr-1950, 71 y.o.   MRN: [id number]   Reason for Appointment:  follow-up   History of Present Illness    DIABETES:  Diagnosis: Type 2 DIABETES MELITUS, date of diagnosis:  2005    She was initially treated with insulin and subsequently was transitioned to Kombiglyze XR with excellent control Previously had upper normal A1c results and had been fairly compliant with diet and exercise Since early 2014 her A1c levels had been relatively higher  Recent history:   Oral hypoglycemic drugs: Synjardy 08/998, 1 tablet daily, Actos 15 mg daily  Her A1c is 7 compared to 6.9  Lowest level has been 6.9  Current management, blood sugar patterns and problems identified: She has fairly good blood sugars at home although unable to download her meter Lab glucose was 124 fasting Synjardy dose has been limited from higher doses of Jardiance because of yeast infections Now she says that even when she tried to take the 08/998 twice a day she would have nausea and is only taking 1 tablet at lunch Exercising about 4/7 at the gym and usually attends aerobic classes   Side effects from medications: Candida vaginitis from [place] blood glucose: About once a day.    Glucometer:   Verio       Blood Glucose readings from download:   PRE-MEAL Fasting Lunch Dinner Bedtime Overall  Glucose range: 114-136 104 89    Mean/median:        POST-MEAL PC Breakfast PC Lunch PC Dinner  Glucose range:  145 169  Mean/median:      Prior  PRE-MEAL Fasting Lunch Dinner Bedtime Overall  Glucose range: 93-146    81-155  Mean/median: 110    117   POST-MEAL PC Breakfast PC Lunch PC Dinner  Glucose range:     Mean/median:   140               Dietician visit: Most recent: 2009             Wt Readings from Last 3 Encounters:  10/14/21 162 lb (73.5 kg)  06/09/21 158 lb (71.7 kg)  04/13/21 155 lb (70.3 kg)   Diabetes Labs:  Lab  Results  Component Value Date   HGBA1C 7.0 (H) 10/07/2021   HGBA1C 6.9 (H) 06/07/2021   HGBA1C 7.9 (H) 02/21/2021   Lab Results  Component Value Date   MICROALBUR 2.1 (H) 08/20/2020   LDLCALC 87 06/07/2021   CREATININE 1.27 (H) 10/07/2021    Other active problems addressed today: See review of systems  Allergies as of 10/14/2021   No Known Allergies      Medication List        Accurate as of October 14, 2021  8:24 AM. If you have any questions, ask your nurse or doctor.          Bayer Microlet Lancets lancets Use as instructed to check blood sugar 2 times per day dx code E11.65   onetouch ultrasoft lancets Use to check blood sugar twice daily.   desonide 0.05 % ointment Commonly known as: DESOWEN Apply sparingly to affected areas on the face or neck   latanoprost 0.005 % ophthalmic solution Commonly known as: Xalatan Place 1 drop into the right eye at bedtime.   levothyroxine 50 MCG tablet Commonly known as: SYNTHROID TAKE 1 TABLET BY MOUTH EVERY DAY   loratadine 10 MG tablet Commonly known as: CLARITIN Take 10 mg by  mouth 2 (two) times daily.   Moderna COVID-19 Bival Booster 50 MCG/0.5ML injection Generic drug: COVID-19 mRNA bivalent vaccine (Moderna) Inject into the muscle.   Moderna COVID-19 Vaccine 100 MCG/0.5ML injection Generic drug: COVID-19 mRNA vaccine (Moderna) Inject into the muscle.   mometasone 0.1 % ointment Commonly known as: ELOCON Apply topically daily. Apply sparingly  to affected areas as needed below face and neck   OneTouch Verio test strip Generic drug: glucose blood Use to check blood sugar twice daily.   OneTouch Verio w/Device Kit Use to check blood sugar daily   pioglitazone 15 MG tablet Commonly known as: ACTOS TAKE 1 TABLET (15 MG TOTAL) BY MOUTH DAILY.   rosuvastatin 5 MG tablet Commonly known as: CRESTOR TAKE 1 TABLET BY MOUTH EVERY DAY   Synjardy 08-998 MG Tabs Generic drug: Empagliflozin-metFORMIN HCl TAKE 2  TABLETS BY MOUTH EVERY DAY   tiZANidine 2 MG tablet Commonly known as: ZANAFLEX Take 1 to 2 tablets every 8 hours as needed for muscle spasms   triamcinolone cream 0.1 % Commonly known as: KENALOG   valsartan 80 MG tablet Commonly known as: DIOVAN TAKE 1 TABLET BY MOUTH EVERY DAY        Allergies: No Known Allergies  Past Medical History:  Diagnosis Date   Diabetes ([place]) 2005   High blood pressure 1999    Past Surgical History:  Procedure Laterality Date   ABDOMINAL HYSTERECTOMY     GIVENS CAPSULE STUDY N/A 03/04/2018   Procedure: GIVENS CAPSULE STUDY;  Surgeon: Juanita Craver, MD;  Location[facility] ENDOSCOPY;  Service: Endoscopy;  Laterality: N/A;    Family History  Problem Relation Age of Onset   Diabetes Mother    Asthma Grandson    Eczema Grandson    Asthma Nephew    Eczema Daughter     Social History:  reports that she has never smoked. She has never used smokeless tobacco. She reports that she does not currently use alcohol. She reports that she does not use drugs.  Review of Systems:  She has had diabetic retinopathy treated with laser and follows with Dr. Zadie Rhine 1/23 She has not had any macular edema for some time and she has not had any injections for treatment for about 6 years apparently  HYPERTENSION: Now taking valsartan 80 mg daily, Also followed by PCP   blood pressure was low normal with taking HCTZ also  BP Readings from Last 3 Encounters:  10/14/21 118/70  06/09/21 124/64  02/24/21 122/72    History of mild renal insufficiency:   Microalbumin has been normal  Previously taking etodolac  Lab Results  Component Value Date   CREATININE 1.27 (H) 10/07/2021   CREATININE 1.18 06/07/2021   CREATININE 1.29 (H) 02/21/2021    HYPERLIPIDEMIA: The lipid abnormality consists of elevated LDL.  She has good control of LDL with Crestor 53m and she is consistent on this No side effects  Lab Results  Component Value Date   CHOL 185 06/07/2021    HDL 85.00 06/07/2021   LDLCALC 87 06/07/2021   LDLDIRECT 144.0 02/18/2016   TRIG 68.0 06/07/2021   CHOLHDL 2 06/07/2021    Mild hypothyroidism: Has been usually controlled with 50 g levothyroxine  Her TSH was high in 8/22 when she ran out of refills  This is now back to normal with the same dose   Lab Results  Component Value Date   TSH 1.74 10/07/2021   TSH 2.34 02/21/2021   TSH 6.40 (H) 11/16/2020  FREET4 0.98 02/21/2021   FREET4 0.89 10/09/2013    Diabetic foot exam in 8/22 showed normal monofilament sensation in the toes and plantar surfaces, no skin lesions or ulcers on the feet and normal pedal pulses  Last eye exam: 1/23      Examination:   BP 118/70 (BP Location: Left Arm, Patient Position: Sitting, Cuff Size: Normal)   Pulse 79   Ht _0  (1.626 m)   Wt 162 lb (73.5 kg)   SpO2 96%   BMI 27.81 kg/m   Body mass index is 27.81 kg/m.   No edema  ASSESSMENT/ PLAN:   Diabetes type 2  See history of present illness for detailed discussion of current diabetes management, blood sugar patterns and problems identified  Her A1c is at 7  She is continuing to benefit from exercise Currently not able to tolerate full doses of Synjardy but blood sugars are still stable  Recommendations:  As long as A1c is around 7 we will continue her on the reduced dose of Synjardy May consider using separate dose of Jardiance 10 mg and metformin ER if A1c tends to rise or potentially if her weight goes up further Continue to monitor blood sugars by rotation at different time Given her information on the One Touch reveal app that she can use on her phone to keep up with her blood sugar progress and enable download more easily Follow-up in 4 months   Blood pressure is controlled on 80 mg valsartan Renal function borderline but overall stable  Hypercholesterolemia: We will recheck labs on next visit to monitor her rosuvastatin treatment  Hypothyroidism: Mild and controlled  with 50 mcg levothyroxine  Elayne Snare 10/14/2021, 8:24 AM

## 2021-10-14 NOTE — Patient Instructions (Signed)
Check blood sugars on waking up days a week  Also check blood sugars about 2 hours after meals and do this after different meals by rotation  Recommended blood sugar levels on waking up are 90-130 and about 2 hours after meal is 130-160  Please bring your blood sugar monitor to each visit, thank you   

## 2021-10-18 DIAGNOSIS — G4733 Obstructive sleep apnea (adult) (pediatric): Secondary | ICD-10-CM | POA: Diagnosis not present

## 2021-10-18 DIAGNOSIS — I10 Essential (primary) hypertension: Secondary | ICD-10-CM | POA: Diagnosis not present

## 2021-10-18 DIAGNOSIS — R0683 Snoring: Secondary | ICD-10-CM | POA: Diagnosis not present

## 2021-11-11 ENCOUNTER — Other Ambulatory Visit: Payer: Self-pay | Admitting: Family Medicine

## 2021-11-11 ENCOUNTER — Ambulatory Visit
Admission: RE | Admit: 2021-11-11 | Discharge: 2021-11-11 | Disposition: A | Discharge status: Home / Self Care | Payer: Medicare Other | Source: Ambulatory Visit | Attending: Family Medicine | Admitting: Family Medicine

## 2021-11-11 DIAGNOSIS — M25511 Pain in right shoulder: Secondary | ICD-10-CM | POA: Diagnosis not present

## 2021-11-11 DIAGNOSIS — E1169 Type 2 diabetes mellitus with other specified complication: Secondary | ICD-10-CM | POA: Diagnosis not present

## 2021-11-11 DIAGNOSIS — M25519 Pain in unspecified shoulder: Secondary | ICD-10-CM | POA: Diagnosis not present

## 2021-11-11 DIAGNOSIS — Z0000 Encounter for general adult medical examination without abnormal findings: Secondary | ICD-10-CM | POA: Diagnosis not present

## 2021-11-15 DIAGNOSIS — M542 Cervicalgia: Secondary | ICD-10-CM | POA: Diagnosis not present

## 2021-11-15 DIAGNOSIS — M25511 Pain in right shoulder: Secondary | ICD-10-CM | POA: Diagnosis not present

## 2021-11-17 DIAGNOSIS — M25511 Pain in right shoulder: Secondary | ICD-10-CM | POA: Diagnosis not present

## 2021-11-17 DIAGNOSIS — M542 Cervicalgia: Secondary | ICD-10-CM | POA: Diagnosis not present

## 2021-11-18 DIAGNOSIS — G4733 Obstructive sleep apnea (adult) (pediatric): Secondary | ICD-10-CM | POA: Diagnosis not present

## 2021-11-18 DIAGNOSIS — I10 Essential (primary) hypertension: Secondary | ICD-10-CM | POA: Diagnosis not present

## 2021-11-18 DIAGNOSIS — R0683 Snoring: Secondary | ICD-10-CM | POA: Diagnosis not present

## 2021-11-21 DIAGNOSIS — M542 Cervicalgia: Secondary | ICD-10-CM | POA: Diagnosis not present

## 2021-12-14 DIAGNOSIS — Z1231 Encounter for screening mammogram for malignant neoplasm of breast: Secondary | ICD-10-CM | POA: Diagnosis not present

## 2021-12-19 DIAGNOSIS — R0683 Snoring: Secondary | ICD-10-CM | POA: Diagnosis not present

## 2021-12-19 DIAGNOSIS — I10 Essential (primary) hypertension: Secondary | ICD-10-CM | POA: Diagnosis not present

## 2021-12-19 DIAGNOSIS — G4733 Obstructive sleep apnea (adult) (pediatric): Secondary | ICD-10-CM | POA: Diagnosis not present

## 2021-12-22 ENCOUNTER — Other Ambulatory Visit: Payer: Self-pay | Admitting: Endocrinology

## 2022-01-14 ENCOUNTER — Other Ambulatory Visit (INDEPENDENT_AMBULATORY_CARE_PROVIDER_SITE_OTHER): Payer: Self-pay | Admitting: Ophthalmology

## 2022-01-18 DIAGNOSIS — I10 Essential (primary) hypertension: Secondary | ICD-10-CM | POA: Diagnosis not present

## 2022-01-18 DIAGNOSIS — G4733 Obstructive sleep apnea (adult) (pediatric): Secondary | ICD-10-CM | POA: Diagnosis not present

## 2022-01-18 DIAGNOSIS — R0683 Snoring: Secondary | ICD-10-CM | POA: Diagnosis not present

## 2022-01-20 ENCOUNTER — Other Ambulatory Visit (HOSPITAL_BASED_OUTPATIENT_CLINIC_OR_DEPARTMENT_OTHER): Payer: Self-pay

## 2022-01-20 MED ORDER — COVID-19 MRNA 2023-2024 VACCINE (COMIRNATY) 0.3 ML INJECTION
INTRAMUSCULAR | 0 refills | Status: DC
  Filled 2022-01-20 (×2): qty 0.3, 1d supply, fill #0

## 2022-01-20 MED ORDER — INFLUENZA VAC A&B SA ADJ QUAD 0.5 ML IM PRSY
PREFILLED_SYRINGE | INTRAMUSCULAR | 0 refills | Status: DC
  Filled 2022-01-20: qty 0.5, 1d supply, fill #0

## 2022-02-02 ENCOUNTER — Encounter (INDEPENDENT_AMBULATORY_CARE_PROVIDER_SITE_OTHER): Payer: Medicare Other | Admitting: Ophthalmology

## 2022-02-10 ENCOUNTER — Other Ambulatory Visit (INDEPENDENT_AMBULATORY_CARE_PROVIDER_SITE_OTHER): Payer: Medicare Other

## 2022-02-10 DIAGNOSIS — E119 Type 2 diabetes mellitus without complications: Secondary | ICD-10-CM | POA: Diagnosis not present

## 2022-02-10 DIAGNOSIS — E782 Mixed hyperlipidemia: Secondary | ICD-10-CM | POA: Diagnosis not present

## 2022-02-10 LAB — MICROALBUMIN / CREATININE URINE RATIO
Creatinine,U: 41.7 mg/dL
Microalb Creat Ratio: 2.3 mg/g (ref 0.0–30.0)
Microalb, Ur: 1 mg/dL (ref 0.0–1.9)

## 2022-02-10 LAB — LIPID PANEL
Cholesterol: 226 mg/dL — ABNORMAL HIGH (ref 0–200)
HDL: 69.5 mg/dL (ref >39.00)
LDL Cholesterol: 143 mg/dL — ABNORMAL HIGH (ref 0–99)
NonHDL: 156.94
Total CHOL/HDL Ratio: 3
Triglycerides: 70 mg/dL (ref 0.0–149.0)
VLDL: 14 mg/dL (ref 0.0–40.0)

## 2022-02-10 LAB — BASIC METABOLIC PANEL
BUN: 16 mg/dL (ref 6–23)
CO2: 30 mEq/L (ref 19–32)
Calcium: 9.9 mg/dL (ref 8.4–10.5)
Chloride: 100 mEq/L (ref 96–112)
Creatinine, Ser: 1.14 mg/dL (ref 0.40–1.20)
GFR: 48.32 mL/min — ABNORMAL LOW (ref >60.00)
Glucose, Bld: 154 mg/dL — ABNORMAL HIGH (ref 70–99)
Potassium: 4.2 mEq/L (ref 3.5–5.1)
Sodium: 137 mEq/L (ref 135–145)

## 2022-02-10 LAB — HEMOGLOBIN A1C: Hgb A1c MFr Bld: 7.7 % — ABNORMAL HIGH (ref 4.6–6.5)

## 2022-02-14 ENCOUNTER — Encounter: Payer: Self-pay | Admitting: Endocrinology

## 2022-02-14 ENCOUNTER — Ambulatory Visit: Payer: Medicare Other | Admitting: Endocrinology

## 2022-02-14 VITALS — BP 128/80 | HR 63 | Ht 64.0 in | Wt 156.2 lb

## 2022-02-14 DIAGNOSIS — E782 Mixed hyperlipidemia: Secondary | ICD-10-CM | POA: Diagnosis not present

## 2022-02-14 DIAGNOSIS — E063 Autoimmune thyroiditis: Secondary | ICD-10-CM | POA: Diagnosis not present

## 2022-02-14 DIAGNOSIS — E1165 Type 2 diabetes mellitus with hyperglycemia: Secondary | ICD-10-CM

## 2022-02-14 MED ORDER — PIOGLITAZONE HCL 15 MG PO TABS: 15.0000 mg | ORAL_TABLET | Freq: Every day | ORAL | 1 refills | Status: DC

## 2022-02-14 MED ORDER — ROSUVASTATIN CALCIUM 5 MG PO TABS: 5.0000 mg | ORAL_TABLET | Freq: Every day | ORAL | 1 refills | Status: DC

## 2022-02-14 MED ORDER — SYNJARDY 5-1000 MG PO TABS: 2.0000 | ORAL_TABLET | Freq: Every day | ORAL | 2 refills | Status: DC

## 2022-02-14 NOTE — Patient Instructions (Signed)
Check blood sugars on waking up 2-3 days a week  Also check blood sugars about 2 hours after meals and do this after different meals by rotation  Recommended blood sugar levels on waking up are 90-130 and about 2 hours after meal is 130-160  Please bring your blood sugar monitor to each visit, thank you   

## 2022-02-14 NOTE — Progress Notes (Signed)
Patient ID: Allison Matthews, female   DOB: 11-24-1950, 71 y.o.   MRN: [id number]   Reason for Appointment:  follow-up   History of Present Illness    DIABETES:  Diagnosis: Type 2 DIABETES MELITUS, date of diagnosis:  2005    She was initially treated with insulin and subsequently was transitioned to Kombiglyze XR with excellent control Previously had upper normal A1c results and had been fairly compliant with diet and exercise Since early 2014 her A1c levels had been relatively higher  Recent history:   Oral hypoglycemic drugs: Synjardy 08/998, 1 tablet daily , Actos 15 mg daily  Her A1c is 7.7 compared to 7  Lowest level has been 6.9  Current management, blood sugar patterns and problems identified: She has fairly good blood sugars at home although checking very infrequently  Highest glucose was 169 possibly before dinner Lab glucose was 154 fasting She now says that she does not take her Synjardy every day as she feels fairly good Also somehow she has not been able to get refill on her Actos for some time She is doing well with her exercises but occasionally will have sweets However her weight is down No side effects with Synjardy or Actos Exercising up to 5 times a week at the gym and usually attends aerobic classes   Side effects from medications: Candida vaginitis from [place] blood glucose: About once a day.    Glucometer:   Verio       Blood Glucose readings from download:  Recently has only 3 readings between 125 and 169 Previously:  PRE-MEAL Fasting Lunch Dinner Bedtime Overall  Glucose range: 114-136 104 89    Mean/median:        POST-MEAL PC Breakfast PC Lunch PC Dinner  Glucose range:  145 169  Mean/median:                 Dietician visit: Most recent: 2009             Wt Readings from Last 3 Encounters:  02/14/22 156 lb 3.2 oz (70.9 kg)  10/14/21 162 lb (73.5 kg)  06/09/21 158 lb (71.7 kg)   Diabetes Labs:  Lab  Results  Component Value Date   HGBA1C 7.7 (H) 02/10/2022   HGBA1C 7.0 (H) 10/07/2021   HGBA1C 6.9 (H) 06/07/2021   Lab Results  Component Value Date   MICROALBUR 1.0 02/10/2022   LDLCALC 143 (H) 02/10/2022   CREATININE 1.14 02/10/2022    Other active problems addressed today: See review of systems  Allergies as of 02/14/2022   No Known Allergies      Medication List        Accurate as of February 14, 2022  8:43 AM. If you have any questions, ask your nurse or doctor.          Bayer Microlet Lancets lancets Use as instructed to check blood sugar 2 times per day dx code E11.65   onetouch ultrasoft lancets Use to check blood sugar twice daily.   desonide 0.05 % ointment Commonly known as: DESOWEN Apply sparingly to affected areas on the face or neck   Fluad Quadrivalent 0.5 ML injection Generic drug: influenza vaccine adjuvanted Inject into the muscle.   latanoprost 0.005 % ophthalmic solution Commonly known as: XALATAN PLACE 1 DROP INTO THE RIGHT EYE AT BEDTIME.   levothyroxine 50 MCG tablet Commonly known as: SYNTHROID TAKE 1 TABLET BY MOUTH EVERY DAY   loratadine 10 MG tablet  Commonly known as: CLARITIN Take 10 mg by mouth 2 (two) times daily.   Moderna COVID-19 Bival Booster 50 MCG/0.5ML injection Generic drug: COVID-19 mRNA bivalent vaccine (Moderna) Inject into the muscle.   Moderna COVID-19 Vaccine 100 MCG/0.5ML injection Generic drug: COVID-19 mRNA vaccine (Moderna) Inject into the muscle.   Comirnaty Susp injection Generic drug: COVID-19 mRNA Vac-TriS (Pfizer) Inject into the muscle.   mometasone 0.1 % ointment Commonly known as: ELOCON Apply topically daily. Apply sparingly  to affected areas as needed below face and neck   OneTouch Verio test strip Generic drug: glucose blood Use to check blood sugar twice daily.   OneTouch Verio w/Device Kit Use to check blood sugar daily   pioglitazone 15 MG tablet Commonly known as: ACTOS TAKE  1 TABLET (15 MG TOTAL) BY MOUTH DAILY.   rosuvastatin 5 MG tablet Commonly known as: CRESTOR TAKE 1 TABLET BY MOUTH EVERY DAY   Synjardy 08-998 MG Tabs Generic drug: Empagliflozin-metFORMIN HCl TAKE 2 TABLETS BY MOUTH EVERY DAY   tiZANidine 2 MG tablet Commonly known as: ZANAFLEX Take 1 to 2 tablets every 8 hours as needed for muscle spasms   triamcinolone cream 0.1 % Commonly known as: KENALOG   valsartan 80 MG tablet Commonly known as: DIOVAN TAKE 1 TABLET BY MOUTH EVERY DAY        Allergies: No Known Allergies  Past Medical History:  Diagnosis Date   Diabetes ([place]) 2005   High blood pressure 1999    Past Surgical History:  Procedure Laterality Date   ABDOMINAL HYSTERECTOMY     GIVENS CAPSULE STUDY N/A 03/04/2018   Procedure: GIVENS CAPSULE STUDY;  Surgeon: Juanita Craver, MD;  Location: [facility] ENDOSCOPY;  Service: Endoscopy;  Laterality: N/A;    Family History  Problem Relation Age of Onset   Diabetes Mother    Asthma Grandson    Eczema Grandson    Asthma Nephew    Eczema Daughter     Social History:  reports that she has never smoked. She has never used smokeless tobacco. She reports that she does not currently use alcohol. She reports that she does not use drugs.  Review of Systems:  She has had diabetic retinopathy treated with laser and follows with Dr. Zadie Rhine 1/23 She has not had any macular edema for some time and she has not had any injections for treatment for about 6 years apparently  HYPERTENSION: She is taking valsartan 80 mg daily, Also followed by PCP   blood pressure was low normal with taking HCTZ also  BP Readings from Last 3 Encounters:  02/14/22 128/80  10/14/21 118/70  06/09/21 124/64    History of mild renal insufficiency:   Microalbumin has been normal   Lab Results  Component Value Date   CREATININE 1.14 02/10/2022   CREATININE 1.27 (H) 10/07/2021   CREATININE 1.18 06/07/2021    HYPERLIPIDEMIA: The lipid abnormality  consists of elevated LDL.  She has good control of LDL with Crestor 4m when she is consistent However recently has not had a prescription filled with higher LDL No side effects  Lab Results  Component Value Date   CHOL 226 (H) 02/10/2022   HDL 69.50 02/10/2022   LDLCALC 143 (H) 02/10/2022   LDLDIRECT 144.0 02/18/2016   TRIG 70.0 02/10/2022   CHOLHDL 3 02/10/2022    Mild hypothyroidism: Has been usually controlled with 50 g levothyroxine  Her TSH was high in 8/22 when she ran out of refills  This is now back to  normal with the same dose   Lab Results  Component Value Date   TSH 1.74 10/07/2021   TSH 2.34 02/21/2021   TSH 6.40 (H) 11/16/2020   FREET4 0.98 02/21/2021   FREET4 0.89 10/09/2013    Diabetic foot exam in 8/22 showed normal monofilament sensation in the toes and plantar surfaces, no skin lesions or ulcers on the feet and normal pedal pulses  Last eye exam: 1/23          Fib-4 interpretation is not validated for people under 70 or over 72 years of age.       Examination:   BP 128/80 (BP Location: Left Arm, Patient Position: Sitting, Cuff Size: Normal)   Pulse 63   Ht _0  (1.626 m)   Wt 156 lb 3.2 oz (70.9 kg)   SpO2 97%   BMI 26.81 kg/m   Body mass index is 26.81 kg/m.   No edema  ASSESSMENT/ PLAN:   Diabetes type 2  See history of present illness for detailed discussion of current diabetes management, blood sugar patterns and problems identified  Her A1c is at 7.7 and higher  With her not taking Actos recently and not taking her Synjardy regularly her blood sugars are overall higher This is despite keeping her weight down and exercising Monitoring is infrequent also  Recommendations:  Discussed need to take her diabetes medicines every day and given new prescriptions for both of them No change in dosage Most consistent monitoring of glucose after meals  Follow-up in 3 months   Blood pressure is controlled on 80 mg  valsartan Albuminuria  Hypercholesterolemia: LDL is gone up from her not refilling her Crestor and new prescription sent  Elayne Snare 02/14/2022, 8:43 AM

## 2022-02-18 DIAGNOSIS — R0683 Snoring: Secondary | ICD-10-CM | POA: Diagnosis not present

## 2022-02-18 DIAGNOSIS — I10 Essential (primary) hypertension: Secondary | ICD-10-CM | POA: Diagnosis not present

## 2022-02-18 DIAGNOSIS — G4733 Obstructive sleep apnea (adult) (pediatric): Secondary | ICD-10-CM | POA: Diagnosis not present

## 2022-02-21 DIAGNOSIS — H35033 Hypertensive retinopathy, bilateral: Secondary | ICD-10-CM | POA: Diagnosis not present

## 2022-02-21 DIAGNOSIS — H524 Presbyopia: Secondary | ICD-10-CM | POA: Diagnosis not present

## 2022-02-21 DIAGNOSIS — E119 Type 2 diabetes mellitus without complications: Secondary | ICD-10-CM | POA: Diagnosis not present

## 2022-02-21 DIAGNOSIS — H53143 Visual discomfort, bilateral: Secondary | ICD-10-CM | POA: Diagnosis not present

## 2022-02-21 LAB — HM DIABETES EYE EXAM

## 2022-03-09 DIAGNOSIS — H2513 Age-related nuclear cataract, bilateral: Secondary | ICD-10-CM | POA: Diagnosis not present

## 2022-03-09 DIAGNOSIS — E119 Type 2 diabetes mellitus without complications: Secondary | ICD-10-CM | POA: Diagnosis not present

## 2022-03-09 DIAGNOSIS — H43811 Vitreous degeneration, right eye: Secondary | ICD-10-CM | POA: Diagnosis not present

## 2022-03-09 DIAGNOSIS — H35351 Cystoid macular degeneration, right eye: Secondary | ICD-10-CM | POA: Diagnosis not present

## 2022-03-09 DIAGNOSIS — H401111 Primary open-angle glaucoma, right eye, mild stage: Secondary | ICD-10-CM | POA: Diagnosis not present

## 2022-03-09 DIAGNOSIS — H348112 Central retinal vein occlusion, right eye, stable: Secondary | ICD-10-CM | POA: Diagnosis not present

## 2022-03-13 DIAGNOSIS — E039 Hypothyroidism, unspecified: Secondary | ICD-10-CM | POA: Diagnosis not present

## 2022-03-13 DIAGNOSIS — E1169 Type 2 diabetes mellitus with other specified complication: Secondary | ICD-10-CM | POA: Diagnosis not present

## 2022-03-13 DIAGNOSIS — I10 Essential (primary) hypertension: Secondary | ICD-10-CM | POA: Diagnosis not present

## 2022-03-13 DIAGNOSIS — E785 Hyperlipidemia, unspecified: Secondary | ICD-10-CM | POA: Diagnosis not present

## 2022-03-20 DIAGNOSIS — G4733 Obstructive sleep apnea (adult) (pediatric): Secondary | ICD-10-CM | POA: Diagnosis not present

## 2022-03-20 DIAGNOSIS — I10 Essential (primary) hypertension: Secondary | ICD-10-CM | POA: Diagnosis not present

## 2022-03-20 DIAGNOSIS — R0683 Snoring: Secondary | ICD-10-CM | POA: Diagnosis not present
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
## 2022-04-20 DIAGNOSIS — I10 Essential (primary) hypertension: Secondary | ICD-10-CM | POA: Diagnosis not present

## 2022-04-20 DIAGNOSIS — G4733 Obstructive sleep apnea (adult) (pediatric): Secondary | ICD-10-CM | POA: Diagnosis not present

## 2022-04-20 DIAGNOSIS — R0683 Snoring: Secondary | ICD-10-CM | POA: Diagnosis not present

## 2022-05-06 DIAGNOSIS — I10 Essential (primary) hypertension: Secondary | ICD-10-CM | POA: Diagnosis not present

## 2022-05-06 DIAGNOSIS — G4733 Obstructive sleep apnea (adult) (pediatric): Secondary | ICD-10-CM | POA: Diagnosis not present

## 2022-05-15 ENCOUNTER — Other Ambulatory Visit: Payer: Self-pay | Admitting: Endocrinology

## 2022-05-15 DIAGNOSIS — E1165 Type 2 diabetes mellitus with hyperglycemia: Secondary | ICD-10-CM

## 2022-05-21 DIAGNOSIS — I10 Essential (primary) hypertension: Secondary | ICD-10-CM | POA: Diagnosis not present

## 2022-05-21 DIAGNOSIS — G4733 Obstructive sleep apnea (adult) (pediatric): Secondary | ICD-10-CM | POA: Diagnosis not present

## 2022-05-21 DIAGNOSIS — R0683 Snoring: Secondary | ICD-10-CM | POA: Diagnosis not present

## 2022-05-29 ENCOUNTER — Other Ambulatory Visit (INDEPENDENT_AMBULATORY_CARE_PROVIDER_SITE_OTHER): Payer: Medicare Other

## 2022-05-29 DIAGNOSIS — E1165 Type 2 diabetes mellitus with hyperglycemia: Secondary | ICD-10-CM

## 2022-05-29 DIAGNOSIS — E782 Mixed hyperlipidemia: Secondary | ICD-10-CM

## 2022-05-29 DIAGNOSIS — E063 Autoimmune thyroiditis: Secondary | ICD-10-CM | POA: Diagnosis not present

## 2022-05-29 LAB — HEMOGLOBIN A1C: Hgb A1c MFr Bld: 7.1 % — ABNORMAL HIGH (ref 4.6–6.5)

## 2022-05-29 LAB — COMPREHENSIVE METABOLIC PANEL
ALT: 15 U/L (ref 0–35)
AST: 26 U/L (ref 0–37)
Albumin: 4.2 g/dL (ref 3.5–5.2)
Alkaline Phosphatase: 103 U/L (ref 39–117)
BUN: 11 mg/dL (ref 6–23)
CO2: 29 mEq/L (ref 19–32)
Calcium: 9.9 mg/dL (ref 8.4–10.5)
Chloride: 99 mEq/L (ref 96–112)
Creatinine, Ser: 1.18 mg/dL (ref 0.40–1.20)
GFR: 46.26 mL/min — ABNORMAL LOW (ref >60.00)
Glucose, Bld: 122 mg/dL — ABNORMAL HIGH (ref 70–99)
Potassium: 4.4 mEq/L (ref 3.5–5.1)
Sodium: 136 mEq/L (ref 135–145)
Total Bilirubin: 0.7 mg/dL (ref 0.2–1.2)
Total Protein: 7.6 g/dL (ref 6.0–8.3)

## 2022-05-29 LAB — LIPID PANEL
Cholesterol: 171 mg/dL (ref 0–200)
HDL: 82.9 mg/dL (ref >39.00)
LDL Cholesterol: 79 mg/dL (ref 0–99)
NonHDL: 88.37
Total CHOL/HDL Ratio: 2
Triglycerides: 46 mg/dL (ref 0.0–149.0)
VLDL: 9.2 mg/dL (ref 0.0–40.0)

## 2022-05-29 LAB — TSH: TSH: 3.52 u[IU]/mL (ref 0.35–5.50)

## 2022-05-30 NOTE — Progress Notes (Unsigned)
Patient ID: Allison Matthews, female   DOB: Sep 09, 1950, 72 y.o.   MRN: [id number]   Reason for Appointment:  follow-up   History of Present Illness    DIABETES:  Diagnosis: Type 2 DIABETES MELITUS, date of diagnosis:  2005    She was initially treated with insulin and subsequently was transitioned to Kombiglyze XR with excellent control Previously had upper normal A1c results and had been fairly compliant with diet and exercise Since early 2014 her A1c levels had been relatively higher  Recent history:   Oral hypoglycemic drugs: Synjardy 08/998, 1 tablet daily , Actos 15 mg daily  Her A1c is 7.7 compared to 7  Lowest level has been 6.9  Current management, blood sugar patterns and problems identified: She has fairly good blood sugars at home although checking very infrequently  Highest glucose was 169 possibly before dinner Lab glucose was 154 fasting She now says that she does not take her Synjardy every day as she feels fairly good Also somehow she has not been able to get refill on her Actos for some time She is doing well with her exercises but occasionally will have sweets However her weight is down No side effects with Synjardy or Actos Exercising up to 5 times a week at the gym and usually attends aerobic classes   Side effects from medications: Candida vaginitis from [place] blood glucose: About once a day.    Glucometer:   Verio       Blood Glucose readings from download:  Recently has only 3 readings between 125 and 169 Previously:  PRE-MEAL Fasting Lunch Dinner Bedtime Overall  Glucose range: 114-136 104 89    Mean/median:        POST-MEAL PC Breakfast PC Lunch PC Dinner  Glucose range:  145 169  Mean/median:                 Dietician visit: Most recent: 2009             Wt Readings from Last 3 Encounters:  02/14/22 156 lb 3.2 oz (70.9 kg)  10/14/21 162 lb (73.5 kg)  06/09/21 158 lb (71.7 kg)   Diabetes Labs:  Lab  Results  Component Value Date   HGBA1C 7.1 (H) 05/29/2022   HGBA1C 7.7 (H) 02/10/2022   HGBA1C 7.0 (H) 10/07/2021   Lab Results  Component Value Date   MICROALBUR 1.0 02/10/2022   LDLCALC 79 05/29/2022   CREATININE 1.18 05/29/2022    Other active problems addressed today: See review of systems  Allergies as of 05/31/2022   No Known Allergies      Medication List        Accurate as of May 30, 2022  9:06 PM. If you have any questions, ask your nurse or doctor.          Bayer Microlet Lancets lancets Use as instructed to check blood sugar 2 times per day dx code E11.65   onetouch ultrasoft lancets Use to check blood sugar twice daily.   desonide 0.05 % ointment Commonly known as: DESOWEN Apply sparingly to affected areas on the face or neck   Fluad Quadrivalent 0.5 ML injection Generic drug: influenza vaccine adjuvanted Inject into the muscle.   latanoprost 0.005 % ophthalmic solution Commonly known as: XALATAN PLACE 1 DROP INTO THE RIGHT EYE AT BEDTIME.   levothyroxine 50 MCG tablet Commonly known as: SYNTHROID TAKE 1 TABLET BY MOUTH EVERY DAY   loratadine 10 MG tablet Commonly  known as: CLARITIN Take 10 mg by mouth 2 (two) times daily.   Moderna COVID-19 Bival Booster 50 MCG/0.5ML injection Generic drug: COVID-19 mRNA bivalent vaccine (Moderna) Inject into the muscle.   Moderna COVID-19 Vaccine 100 MCG/0.5ML injection Generic drug: COVID-19 mRNA vaccine (Moderna) Inject into the muscle.   Comirnaty Susp injection Generic drug: COVID-19 mRNA Vac-TriS (Pfizer) Inject into the muscle.   mometasone 0.1 % ointment Commonly known as: ELOCON Apply topically daily. Apply sparingly  to affected areas as needed below face and neck   OneTouch Verio test strip Generic drug: glucose blood USE TO CHECK BLOOD SUGAR TWICE DAILY   OneTouch Verio w/Device Kit Use to check blood sugar daily   pioglitazone 15 MG tablet Commonly known as: ACTOS Take 1  tablet (15 mg total) by mouth daily.   rosuvastatin 5 MG tablet Commonly known as: CRESTOR Take 1 tablet (5 mg total) by mouth daily.   Synjardy 08-998 MG Tabs Generic drug: Empagliflozin-metFORMIN HCl Take 2 tablets by mouth daily.   tiZANidine 2 MG tablet Commonly known as: ZANAFLEX Take 1 to 2 tablets every 8 hours as needed for muscle spasms   triamcinolone cream 0.1 % Commonly known as: KENALOG   valsartan 80 MG tablet Commonly known as: DIOVAN TAKE 1 TABLET BY MOUTH EVERY DAY        Allergies: No Known Allergies  Past Medical History:  Diagnosis Date   Diabetes ([place]) 2005   High blood pressure 1999    Past Surgical History:  Procedure Laterality Date   ABDOMINAL HYSTERECTOMY     GIVENS CAPSULE STUDY N/A 03/04/2018   Procedure: GIVENS CAPSULE STUDY;  Surgeon: Juanita Craver, MD;  Location: [facility] ENDOSCOPY;  Service: Endoscopy;  Laterality: N/A;    Family History  Problem Relation Age of Onset   Diabetes Mother    Asthma Grandson    Eczema Grandson    Asthma Nephew    Eczema Daughter     Social History:  reports that she has never smoked. She has never used smokeless tobacco. She reports that she does not currently use alcohol. She reports that she does not use drugs.  Review of Systems:  She has had diabetic retinopathy treated with laser and follows with Dr. Zadie Rhine 1/23 She has not had any macular edema for some time and she has not had any injections for treatment for about 6 years apparently  HYPERTENSION: She is taking valsartan 80 mg daily, Also followed by PCP   blood pressure was low normal with taking HCTZ also  BP Readings from Last 3 Encounters:  02/14/22 128/80  10/14/21 118/70  06/09/21 124/64    History of mild renal insufficiency:   Microalbumin has been normal   Lab Results  Component Value Date   CREATININE 1.18 05/29/2022   CREATININE 1.14 02/10/2022   CREATININE 1.27 (H) 10/07/2021    HYPERLIPIDEMIA: The lipid  abnormality consists of elevated LDL.  She has good control of LDL with Crestor 16m when she is consistent However recently has not had a prescription filled with higher LDL No side effects  Lab Results  Component Value Date   CHOL 171 05/29/2022   HDL 82.90 05/29/2022   LDLCALC 79 05/29/2022   LDLDIRECT 144.0 02/18/2016   TRIG 46.0 05/29/2022   CHOLHDL 2 05/29/2022    Mild hypothyroidism: Has been usually controlled with 50 g levothyroxine  Her TSH was high in 8/22 when she ran out of refills  This is now back to normal with  the same dose   Lab Results  Component Value Date   TSH 3.52 05/29/2022   TSH 1.74 10/07/2021   TSH 2.34 02/21/2021   FREET4 0.98 02/21/2021   FREET4 0.89 10/09/2013    Diabetic foot exam in 8/22 showed normal monofilament sensation in the toes and plantar surfaces, no skin lesions or ulcers on the feet and normal pedal pulses  Last eye exam: 1/23          Fib-4 interpretation is not validated for people under 33 or over 47 years of age.       Examination:   There were no vitals taken for this visit.  There is no height or weight on file to calculate BMI.   No edema  ASSESSMENT/ PLAN:   Diabetes type 2  See history of present illness for detailed discussion of current diabetes management, blood sugar patterns and problems identified  Her A1c is at 7.7 and higher  With her not taking Actos recently and not taking her Synjardy regularly her blood sugars are overall higher This is despite keeping her weight down and exercising Monitoring is infrequent also  Recommendations:  Discussed need to take her diabetes medicines every day and given new prescriptions for both of them No change in dosage Most consistent monitoring of glucose after meals  Follow-up in 3 months   Blood pressure is controlled on 80 mg valsartan Albuminuria  Hypercholesterolemia: LDL is gone up from her not refilling her Crestor and new prescription  sent  Elayne Snare 05/30/2022, 9:06 PM

## 2022-05-31 ENCOUNTER — Encounter: Payer: Self-pay | Admitting: Endocrinology

## 2022-05-31 ENCOUNTER — Ambulatory Visit: Payer: Medicare Other | Admitting: Endocrinology

## 2022-05-31 VITALS — BP 130/82 | HR 72 | Ht 64.0 in | Wt 161.2 lb

## 2022-05-31 DIAGNOSIS — I10 Essential (primary) hypertension: Secondary | ICD-10-CM

## 2022-05-31 DIAGNOSIS — E119 Type 2 diabetes mellitus without complications: Secondary | ICD-10-CM | POA: Diagnosis not present

## 2022-05-31 DIAGNOSIS — E063 Autoimmune thyroiditis: Secondary | ICD-10-CM

## 2022-05-31 DIAGNOSIS — E782 Mixed hyperlipidemia: Secondary | ICD-10-CM | POA: Diagnosis not present

## 2022-06-08 DIAGNOSIS — H25043 Posterior subcapsular polar age-related cataract, bilateral: Secondary | ICD-10-CM | POA: Diagnosis not present

## 2022-06-08 DIAGNOSIS — H40051 Ocular hypertension, right eye: Secondary | ICD-10-CM | POA: Diagnosis not present

## 2022-06-08 DIAGNOSIS — H18413 Arcus senilis, bilateral: Secondary | ICD-10-CM | POA: Diagnosis not present

## 2022-06-08 DIAGNOSIS — H2513 Age-related nuclear cataract, bilateral: Secondary | ICD-10-CM | POA: Diagnosis not present

## 2022-06-08 DIAGNOSIS — H2511 Age-related nuclear cataract, right eye: Secondary | ICD-10-CM | POA: Diagnosis not present

## 2022-06-08 DIAGNOSIS — H348112 Central retinal vein occlusion, right eye, stable: Secondary | ICD-10-CM | POA: Diagnosis not present

## 2022-07-10 DIAGNOSIS — E1169 Type 2 diabetes mellitus with other specified complication: Secondary | ICD-10-CM | POA: Diagnosis not present

## 2022-07-10 DIAGNOSIS — E785 Hyperlipidemia, unspecified: Secondary | ICD-10-CM | POA: Diagnosis not present

## 2022-07-10 DIAGNOSIS — M779 Enthesopathy, unspecified: Secondary | ICD-10-CM | POA: Diagnosis not present

## 2022-07-10 DIAGNOSIS — G4730 Sleep apnea, unspecified: Secondary | ICD-10-CM | POA: Diagnosis not present

## 2022-07-10 DIAGNOSIS — E7800 Pure hypercholesterolemia, unspecified: Secondary | ICD-10-CM | POA: Diagnosis not present

## 2022-07-10 DIAGNOSIS — E1122 Type 2 diabetes mellitus with diabetic chronic kidney disease: Secondary | ICD-10-CM | POA: Diagnosis not present

## 2022-07-10 DIAGNOSIS — G4720 Circadian rhythm sleep disorder, unspecified type: Secondary | ICD-10-CM | POA: Diagnosis not present

## 2022-07-10 DIAGNOSIS — N186 End stage renal disease: Secondary | ICD-10-CM | POA: Diagnosis not present

## 2022-07-10 DIAGNOSIS — E039 Hypothyroidism, unspecified: Secondary | ICD-10-CM | POA: Diagnosis not present

## 2022-07-10 DIAGNOSIS — I10 Essential (primary) hypertension: Secondary | ICD-10-CM | POA: Diagnosis not present

## 2022-07-10 DIAGNOSIS — I120 Hypertensive chronic kidney disease with stage 5 chronic kidney disease or end stage renal disease: Secondary | ICD-10-CM | POA: Diagnosis not present

## 2022-08-28 DIAGNOSIS — H2511 Age-related nuclear cataract, right eye: Secondary | ICD-10-CM | POA: Diagnosis not present

## 2022-09-05 DIAGNOSIS — H35351 Cystoid macular degeneration, right eye: Secondary | ICD-10-CM | POA: Diagnosis not present

## 2022-09-05 DIAGNOSIS — H348112 Central retinal vein occlusion, right eye, stable: Secondary | ICD-10-CM | POA: Diagnosis not present

## 2022-09-05 DIAGNOSIS — H2513 Age-related nuclear cataract, bilateral: Secondary | ICD-10-CM | POA: Diagnosis not present

## 2022-09-05 DIAGNOSIS — H401111 Primary open-angle glaucoma, right eye, mild stage: Secondary | ICD-10-CM | POA: Diagnosis not present

## 2022-09-05 DIAGNOSIS — H2511 Age-related nuclear cataract, right eye: Secondary | ICD-10-CM | POA: Diagnosis not present

## 2022-09-05 DIAGNOSIS — E119 Type 2 diabetes mellitus without complications: Secondary | ICD-10-CM | POA: Diagnosis not present

## 2022-09-05 DIAGNOSIS — H43811 Vitreous degeneration, right eye: Secondary | ICD-10-CM | POA: Diagnosis not present

## 2022-09-25 ENCOUNTER — Other Ambulatory Visit (INDEPENDENT_AMBULATORY_CARE_PROVIDER_SITE_OTHER): Payer: Medicare Other

## 2022-09-25 DIAGNOSIS — E782 Mixed hyperlipidemia: Secondary | ICD-10-CM | POA: Diagnosis not present

## 2022-09-25 DIAGNOSIS — E119 Type 2 diabetes mellitus without complications: Secondary | ICD-10-CM | POA: Diagnosis not present

## 2022-09-25 LAB — BASIC METABOLIC PANEL
BUN: 17 mg/dL (ref 6–23)
CO2: 26 mEq/L (ref 19–32)
Calcium: 9.6 mg/dL (ref 8.4–10.5)
Chloride: 103 mEq/L (ref 96–112)
Creatinine, Ser: 1.09 mg/dL (ref 0.40–1.20)
GFR: 50.77 mL/min — ABNORMAL LOW (ref >60.00)
Glucose, Bld: 116 mg/dL — ABNORMAL HIGH (ref 70–99)
Potassium: 4.1 mEq/L (ref 3.5–5.1)
Sodium: 137 mEq/L (ref 135–145)

## 2022-09-25 LAB — HEMOGLOBIN A1C: Hgb A1c MFr Bld: 7.1 % — ABNORMAL HIGH (ref 4.6–6.5)

## 2022-09-25 LAB — LDL CHOLESTEROL, DIRECT: Direct LDL: 62 mg/dL

## 2022-09-28 NOTE — Progress Notes (Signed)
Patient ID: Allison Matthews, female   DOB: 1951-01-03, 72 y.o.   MRN: [id number]   Reason for Appointment:  follow-up   History of Present Illness    DIABETES:  Diagnosis: Type 2 DIABETES MELITUS, date of diagnosis:  2005    She was initially treated with insulin and subsequently was transitioned to Kombiglyze XR with excellent control Previously had upper normal A1c results and had been fairly compliant with diet and exercise Since early 2014 her A1c levels had been relatively higher  Recent history:   Oral hypoglycemic drugs: Synjardy 08/998, 1 tablet daily , Actos 15 mg daily, not taking  Her A1c is 7.1 and unchanged  Lowest level has been 6.9  Current management, blood sugar patterns and problems identified: Her blood sugars are being checked mostly in the mornings and they are mildly increased but overall similar to her last visit nonfasting She is however taking more readings after her exercise in the mornings and only a couple after supper She has gained 2 pounds She thinks that she is mostly watching her diet For some reason she did not refill her Actos but blood sugars appear to be similar Even though she is supposed to be taking Synjardy twice a day she is mostly taking it at lunch only  Exercising up to 5 times a week at the gym and usually attends aerobic classes   Side effects from medications: Candida vaginitis from [place]  Monitors blood glucose: About once a day.    Glucometer:   Verio       Blood Glucose readings from download:   PRE-MEAL Fasting Lunch Dinner Bedtime Overall  Glucose range: 121-161      Mean/median:  117   128   POST-MEAL PC Breakfast PC Lunch PC Dinner  Glucose range:   167, 170  Mean/median:      Previously:  PRE-MEAL Fasting Lunch Evenings Bedtime Overall  Glucose range: 122-124 87-128 108-118    Mean/median:     113               Dietician visit: Most recent: 2009             Wt Readings from Last  3 Encounters:  09/29/22 163 lb (73.9 kg)  05/31/22 161 lb 3.2 oz (73.1 kg)  02/14/22 156 lb 3.2 oz (70.9 kg)   Diabetes Labs:  Lab Results  Component Value Date   HGBA1C 7.1 (H) 09/25/2022   HGBA1C 7.1 (H) 05/29/2022   HGBA1C 7.7 (H) 02/10/2022   Lab Results  Component Value Date   MICROALBUR 1.0 02/10/2022   LDLCALC 79 05/29/2022   CREATININE 1.09 09/25/2022    Other active problems addressed today: See review of systems  Allergies as of 09/29/2022   No Known Allergies      Medication List        Accurate as of September 29, 2022  3:01 PM. If you have any questions, ask your nurse or doctor.          STOP taking these medications    pioglitazone 15 MG tablet Commonly known as: ACTOS Stopped by: Reather Littler, MD       TAKE these medications    Bayer Microlet Lancets lancets Use as instructed to check blood sugar 2 times per day dx code E11.65   onetouch ultrasoft lancets Use to check blood sugar twice daily.   desonide 0.05 % ointment Commonly known as: DESOWEN Apply sparingly to affected areas on the face  or neck   Fluad Quadrivalent 0.5 ML injection Generic drug: influenza vaccine adjuvanted Inject into the muscle.   latanoprost 0.005 % ophthalmic solution Commonly known as: XALATAN PLACE 1 DROP INTO THE RIGHT EYE AT BEDTIME.   levothyroxine 50 MCG tablet Commonly known as: SYNTHROID TAKE 1 TABLET BY MOUTH EVERY DAY   loratadine 10 MG tablet Commonly known as: CLARITIN Take 10 mg by mouth 2 (two) times daily.   Moderna COVID-19 Bival Booster 50 MCG/0.5ML injection Generic drug: COVID-19 mRNA bivalent vaccine (Moderna) Inject into the muscle.   Moderna COVID-19 Vaccine 100 MCG/0.5ML injection Generic drug: COVID-19 mRNA vaccine (Moderna) Inject into the muscle.   Comirnaty Susp injection Generic drug: COVID-19 mRNA Vac-TriS (Pfizer) Inject into the muscle.   mometasone 0.1 % ointment Commonly known as: ELOCON Apply topically daily.  Apply sparingly  to affected areas as needed below face and neck   OneTouch Verio test strip Generic drug: glucose blood USE TO CHECK BLOOD SUGAR TWICE DAILY   OneTouch Verio w/Device Kit Use to check blood sugar daily   rosuvastatin 5 MG tablet Commonly known as: CRESTOR Take 1 tablet (5 mg total) by mouth daily.   Synjardy 08-998 MG Tabs Generic drug: Empagliflozin-metFORMIN HCl Take 2 tablets by mouth daily.   tiZANidine 2 MG tablet Commonly known as: ZANAFLEX Take 1 to 2 tablets every 8 hours as needed for muscle spasms   triamcinolone cream 0.1 % Commonly known as: KENALOG   valsartan 80 MG tablet Commonly known as: DIOVAN TAKE 1 TABLET BY MOUTH EVERY DAY        Allergies: No Known Allergies  Past Medical History:  Diagnosis Date   Diabetes ([facility]) 2005   High blood pressure 1999    Past Surgical History:  Procedure Laterality Date   ABDOMINAL HYSTERECTOMY     GIVENS CAPSULE STUDY N/A 03/04/2018   Procedure: GIVENS CAPSULE STUDY;  Surgeon: Charna Elizabeth, MD;  Location[facility] ENDOSCOPY;  Service: Endoscopy;  Laterality: N/A;    Family History  Problem Relation Age of Onset   Diabetes Mother    Asthma Grandson    Eczema Grandson    Asthma Nephew    Eczema Daughter     Social History:  reports that she has never smoked. She has never used smokeless tobacco. She reports that she does not currently use alcohol. She reports that she does not use drugs.  Review of Systems:  She has had diabetic retinopathy treated with laser and follows with Dr. Luciana Axe  HYPERTENSION: She is taking valsartan 80 mg daily, Also followed by PCP  Also periodically checking at home  BP Readings from Last 3 Encounters:  09/29/22 132/80  05/31/22 130/82  02/14/22 128/80    History of mild renal insufficiency at times:    Lab Results  Component Value Date   CREATININE 1.09 09/25/2022   CREATININE 1.18 05/29/2022   CREATININE 1.14 02/10/2022    HYPERLIPIDEMIA: The lipid  abnormality consists of elevated LDL.  She has good control of LDL with Crestor 5mg   No side effects  Lab Results  Component Value Date   CHOL 171 05/29/2022   HDL 82.90 05/29/2022   LDLCALC 79 05/29/2022   LDLDIRECT 62.0 09/25/2022   TRIG 46.0 05/29/2022   CHOLHDL 2 05/29/2022    Mild hypothyroidism: Has been usually controlled with 50 g levothyroxine  Her TSH is consistently normal   Lab Results  Component Value Date   TSH 3.52 05/29/2022   TSH 1.74 10/07/2021  TSH 2.34 02/21/2021   FREET4 0.98 02/21/2021   FREET4 0.89 10/09/2013    Diabetic foot exam in 2/24 showed normal monofilament sensation in the toes and plantar surfaces, no skin lesions or ulcers on the feet and normal pedal pulses      Examination:   BP 132/80 (BP Location: Left Arm, Patient Position: Sitting, Cuff Size: Large)   Pulse 78   Ht 5\' 4"  (1.626 m)   Wt 163 lb (73.9 kg)   SpO2 95%   BMI 27.98 kg/m   Body mass index is 27.98 kg/m.   No ankle edema present   ASSESSMENT/ PLAN:   Diabetes type 2  See history of present illness for detailed discussion of current diabetes management, blood sugar patterns and problems identified  Her A1c is unchanged at 7.1, previously lowest 6.9  She is on low-dose Synjardy once a day only  Overall blood sugars are mildly increased fasting and likely after some meals She has left of her Actos and not clear if blood sugars are higher without this However discussed that for the maximum benefit she should try to take the Synjardy twice a day She will let us know if she has any difficulties with yeast infection if she is taking this twice a day Continue regular exercise She needs to check blood sugars more often after dinner   Blood pressure is improved  Hypercholesterolemia: LDL is controlled with taking Crestor regularly and now 14 which is usually slightly higher  Patient Instructions  Check blood sugars on waking up 3-4 days a week  Also check  blood sugars about 2 hours after meals and do this after different meals by rotation  Recommended blood sugar levels on waking up are 90-130 and about 2 hours after meal is 130-160  Please bring your blood sugar monitor to each visit, thank you   Take Synjardy 2x daily   Reather Littler 09/29/2022, 3:01 PM

## 2022-09-29 ENCOUNTER — Ambulatory Visit: Payer: Medicare Other | Admitting: Endocrinology

## 2022-09-29 ENCOUNTER — Encounter: Payer: Self-pay | Admitting: Endocrinology

## 2022-09-29 VITALS — BP 132/80 | HR 78 | Ht 64.0 in | Wt 163.0 lb

## 2022-09-29 DIAGNOSIS — E1165 Type 2 diabetes mellitus with hyperglycemia: Secondary | ICD-10-CM

## 2022-09-29 DIAGNOSIS — E782 Mixed hyperlipidemia: Secondary | ICD-10-CM | POA: Diagnosis not present

## 2022-09-29 DIAGNOSIS — Z7985 Long-term (current) use of injectable non-insulin antidiabetic drugs: Secondary | ICD-10-CM

## 2022-09-29 NOTE — Patient Instructions (Addendum)
Check blood sugars on waking up 3-4 days a week  Also check blood sugars about 2 hours after meals and do this after different meals by rotation  Recommended blood sugar levels on waking up are 90-130 and about 2 hours after meal is 130-160  Please bring your blood sugar monitor to each visit, thank you   Take Synjardy 2x daily

## 2022-10-15 DIAGNOSIS — U071 COVID-19: Secondary | ICD-10-CM | POA: Diagnosis not present

## 2022-11-02 ENCOUNTER — Other Ambulatory Visit: Payer: Self-pay | Admitting: Endocrinology

## 2022-11-02 DIAGNOSIS — G4733 Obstructive sleep apnea (adult) (pediatric): Secondary | ICD-10-CM | POA: Diagnosis not present

## 2022-11-02 DIAGNOSIS — I10 Essential (primary) hypertension: Secondary | ICD-10-CM | POA: Diagnosis not present

## 2022-11-03 ENCOUNTER — Other Ambulatory Visit: Payer: Self-pay | Admitting: Endocrinology

## 2022-11-09 DIAGNOSIS — J9801 Acute bronchospasm: Secondary | ICD-10-CM | POA: Diagnosis not present

## 2022-11-09 DIAGNOSIS — E1169 Type 2 diabetes mellitus with other specified complication: Secondary | ICD-10-CM | POA: Diagnosis not present

## 2022-11-09 DIAGNOSIS — U071 COVID-19: Secondary | ICD-10-CM | POA: Diagnosis not present

## 2022-11-20 ENCOUNTER — Ambulatory Visit
Admission: RE | Admit: 2022-11-20 | Discharge: 2022-11-20 | Disposition: A | Discharge status: Home / Self Care | Payer: Medicare Other | Source: Ambulatory Visit | Attending: Family Medicine | Admitting: Family Medicine

## 2022-11-20 ENCOUNTER — Other Ambulatory Visit: Payer: Self-pay | Admitting: Family Medicine

## 2022-11-20 DIAGNOSIS — R051 Acute cough: Secondary | ICD-10-CM | POA: Diagnosis not present

## 2022-11-20 DIAGNOSIS — U099 Post covid-19 condition, unspecified: Secondary | ICD-10-CM | POA: Diagnosis not present

## 2022-11-20 DIAGNOSIS — R059 Cough, unspecified: Secondary | ICD-10-CM | POA: Diagnosis not present

## 2022-11-20 DIAGNOSIS — U071 COVID-19: Secondary | ICD-10-CM | POA: Diagnosis not present

## 2022-11-20 DIAGNOSIS — J9801 Acute bronchospasm: Secondary | ICD-10-CM | POA: Diagnosis not present

## 2023-01-03 DIAGNOSIS — H40013 Open angle with borderline findings, low risk, bilateral: Secondary | ICD-10-CM | POA: Diagnosis not present

## 2023-01-03 DIAGNOSIS — H40053 Ocular hypertension, bilateral: Secondary | ICD-10-CM | POA: Diagnosis not present

## 2023-01-04 DIAGNOSIS — H34811 Central retinal vein occlusion, right eye, with macular edema: Secondary | ICD-10-CM | POA: Diagnosis not present

## 2023-01-04 DIAGNOSIS — H35351 Cystoid macular degeneration, right eye: Secondary | ICD-10-CM | POA: Diagnosis not present

## 2023-01-04 DIAGNOSIS — E119 Type 2 diabetes mellitus without complications: Secondary | ICD-10-CM | POA: Diagnosis not present

## 2023-01-04 DIAGNOSIS — H43821 Vitreomacular adhesion, right eye: Secondary | ICD-10-CM | POA: Diagnosis not present

## 2023-01-04 DIAGNOSIS — H401111 Primary open-angle glaucoma, right eye, mild stage: Secondary | ICD-10-CM | POA: Diagnosis not present

## 2023-01-04 DIAGNOSIS — H348112 Central retinal vein occlusion, right eye, stable: Secondary | ICD-10-CM | POA: Diagnosis not present

## 2023-01-04 DIAGNOSIS — H2512 Age-related nuclear cataract, left eye: Secondary | ICD-10-CM | POA: Diagnosis not present

## 2023-01-09 DIAGNOSIS — Z800 Family history of malignant neoplasm of digestive organs: Secondary | ICD-10-CM | POA: Diagnosis not present

## 2023-01-09 DIAGNOSIS — K5904 Chronic idiopathic constipation: Secondary | ICD-10-CM | POA: Diagnosis not present

## 2023-01-09 DIAGNOSIS — Z860100 Personal history of colon polyps, unspecified: Secondary | ICD-10-CM | POA: Diagnosis not present

## 2023-01-09 DIAGNOSIS — Z1211 Encounter for screening for malignant neoplasm of colon: Secondary | ICD-10-CM | POA: Diagnosis not present

## 2023-01-10 DIAGNOSIS — Z1231 Encounter for screening mammogram for malignant neoplasm of breast: Secondary | ICD-10-CM | POA: Diagnosis not present

## 2023-01-18 DIAGNOSIS — H35351 Cystoid macular degeneration, right eye: Secondary | ICD-10-CM | POA: Diagnosis not present

## 2023-01-18 DIAGNOSIS — H34811 Central retinal vein occlusion, right eye, with macular edema: Secondary | ICD-10-CM | POA: Diagnosis not present

## 2023-01-18 DIAGNOSIS — H401111 Primary open-angle glaucoma, right eye, mild stage: Secondary | ICD-10-CM | POA: Diagnosis not present

## 2023-01-18 DIAGNOSIS — E119 Type 2 diabetes mellitus without complications: Secondary | ICD-10-CM | POA: Diagnosis not present

## 2023-01-18 DIAGNOSIS — H2512 Age-related nuclear cataract, left eye: Secondary | ICD-10-CM | POA: Diagnosis not present

## 2023-01-18 DIAGNOSIS — H43821 Vitreomacular adhesion, right eye: Secondary | ICD-10-CM | POA: Diagnosis not present

## 2023-01-22 ENCOUNTER — Other Ambulatory Visit (HOSPITAL_BASED_OUTPATIENT_CLINIC_OR_DEPARTMENT_OTHER): Payer: Self-pay

## 2023-01-22 MED ORDER — FLUAD 0.5 ML IM SUSY
0.5000 mL | PREFILLED_SYRINGE | Freq: Once | INTRAMUSCULAR | 0 refills | Status: AC
  Filled 2023-01-22: qty 0.5, 1d supply, fill #0

## 2023-02-01 DIAGNOSIS — H348111 Central retinal vein occlusion, right eye, with retinal neovascularization: Secondary | ICD-10-CM | POA: Diagnosis not present

## 2023-02-05 ENCOUNTER — Other Ambulatory Visit: Payer: Self-pay

## 2023-02-05 DIAGNOSIS — E1165 Type 2 diabetes mellitus with hyperglycemia: Secondary | ICD-10-CM

## 2023-02-06 ENCOUNTER — Other Ambulatory Visit: Payer: Medicare Other

## 2023-02-12 ENCOUNTER — Ambulatory Visit: Payer: Medicare Other | Admitting: Endocrinology

## 2023-02-12 DIAGNOSIS — K562 Volvulus: Secondary | ICD-10-CM | POA: Diagnosis not present

## 2023-02-12 DIAGNOSIS — Z860100 Personal history of colon polyps, unspecified: Secondary | ICD-10-CM | POA: Diagnosis not present

## 2023-02-12 DIAGNOSIS — Z1211 Encounter for screening for malignant neoplasm of colon: Secondary | ICD-10-CM | POA: Diagnosis not present

## 2023-02-12 DIAGNOSIS — Z800 Family history of malignant neoplasm of digestive organs: Secondary | ICD-10-CM | POA: Diagnosis not present

## 2023-03-13 ENCOUNTER — Other Ambulatory Visit: Payer: Self-pay

## 2023-03-13 DIAGNOSIS — E1165 Type 2 diabetes mellitus with hyperglycemia: Secondary | ICD-10-CM

## 2023-03-29 DIAGNOSIS — M25311 Other instability, right shoulder: Secondary | ICD-10-CM | POA: Diagnosis not present

## 2023-03-29 DIAGNOSIS — M542 Cervicalgia: Secondary | ICD-10-CM | POA: Diagnosis not present

## 2023-03-31 ENCOUNTER — Other Ambulatory Visit: Payer: Self-pay

## 2023-03-31 DIAGNOSIS — E1165 Type 2 diabetes mellitus with hyperglycemia: Secondary | ICD-10-CM

## 2023-04-02 ENCOUNTER — Other Ambulatory Visit: Payer: Medicare Other

## 2023-04-02 DIAGNOSIS — E1165 Type 2 diabetes mellitus with hyperglycemia: Secondary | ICD-10-CM | POA: Diagnosis not present

## 2023-04-03 LAB — BASIC METABOLIC PANEL
BUN: 18 mg/dL (ref 7–25)
CO2: 30 mmol/L (ref 20–32)
Calcium: 10.1 mg/dL (ref 8.6–10.4)
Chloride: 100 mmol/L (ref 98–110)
Creat: 0.98 mg/dL (ref 0.60–1.00)
Glucose, Bld: 94 mg/dL (ref 65–99)
Potassium: 5.4 mmol/L — ABNORMAL HIGH (ref 3.5–5.3)
Sodium: 138 mmol/L (ref 135–146)

## 2023-04-03 LAB — HEMOGLOBIN A1C
Hgb A1c MFr Bld: 6.8 %{Hb} — ABNORMAL HIGH (ref <5.7)
Mean Plasma Glucose: 148 mg/dL
eAG (mmol/L): 8.2 mmol/L

## 2023-04-12 ENCOUNTER — Ambulatory Visit: Payer: Medicare Other | Admitting: Endocrinology

## 2023-04-12 ENCOUNTER — Encounter: Payer: Self-pay | Admitting: Endocrinology

## 2023-04-12 VITALS — BP 138/60 | HR 60 | Resp 20 | Ht 64.0 in | Wt 150.2 lb

## 2023-04-12 DIAGNOSIS — E782 Mixed hyperlipidemia: Secondary | ICD-10-CM

## 2023-04-12 DIAGNOSIS — E118 Type 2 diabetes mellitus with unspecified complications: Secondary | ICD-10-CM | POA: Diagnosis not present

## 2023-04-12 DIAGNOSIS — Z7984 Long term (current) use of oral hypoglycemic drugs: Secondary | ICD-10-CM

## 2023-04-12 DIAGNOSIS — E063 Autoimmune thyroiditis: Secondary | ICD-10-CM | POA: Diagnosis not present

## 2023-04-12 DIAGNOSIS — E1165 Type 2 diabetes mellitus with hyperglycemia: Secondary | ICD-10-CM | POA: Diagnosis not present

## 2023-04-12 MED ORDER — LEVOTHYROXINE SODIUM 50 MCG PO TABS: 50.0000 ug | ORAL_TABLET | Freq: Every day | ORAL | 3 refills | Status: DC

## 2023-04-12 MED ORDER — SYNJARDY 5-1000 MG PO TABS: 1.0000 | ORAL_TABLET | Freq: Every day | ORAL | 3 refills | Status: DC

## 2023-04-12 MED ORDER — ROSUVASTATIN CALCIUM 5 MG PO TABS: 5.0000 mg | ORAL_TABLET | Freq: Every day | ORAL | 3 refills | Status: DC

## 2023-04-12 NOTE — Progress Notes (Signed)
 Outpatient Endocrinology Note Allison Potts, MD   Patient's Name: Allison Matthews    DOB: 07-Jan-1951    MRN: [id number]                                                    REASON OF VISIT: Follow up for type 2 diabetes mellitus  PCP: Benjamine Aland, MD  HISTORY OF PRESENT ILLNESS:   William Laske is a 73 y.o. old female with past medical history listed below, is here for follow up for type 2 diabetes mellitus / hypothyroidism.   Pertinent Diabetes History: Patient was previously seen by Dr. Von and was last time seen in June 2024.  Patient was diagnosed with type 2 diabetes mellitus in 2005.  Patient was initially treated with insulin  and subsequently transition to Kombiglyze  with excellent control.  Patient has a relatively controlled type 2 diabetes mellitus.  Chronic Diabetes Complications : Retinopathy: yes. Last ophthalmology exam was done on every 6 months, following with ophthalmology regularly.  Treated with laser therapy in the past.  Follows up with Dr. Elner. Nephropathy: CKD IIIa, on valsartan . Peripheral neuropathy: no Coronary artery disease: no Stroke: no  Relevant comorbidities and cardiovascular risk factors: Obesity: no Body mass index is 25.78 kg/m.  Hypertension: Yes  Hyperlipidemia : Yes, on statin   Current / Home Diabetic regimen includes:   Synjardy  08/998, 1 tablet daily.   Prior diabetic medications: Insulin  therapy in the past long time ago.  Actos  in the past.  GI upset with higher dose of Synjardy .  Glycemic data:   One Touch Verio placed glucometer.  Glucometer download from December 19 to April 12, 2023.  Blood sugar 117.  Lowest blood sugar 77 and highest blood sugar 177.  She has been checking in a brace once a day at different times of the day.  No hypoglycemia.  No concerning hyperglycemia.  Hypoglycemia: Patient has no hypoglycemic episodes. Patient has hypoglycemia awareness.  Factors modifying glucose control: 1.   Diabetic diet assessment: 3 meals a day.  2.  Staying active or exercising: Gym and aerobic classes.  Exercising about 5 times a week.  3.  Medication compliance: compliant all of the time.  # Primary hypothyroidism : -Taking levothyroxine  50 mcg daily.  Interval history  Diabetes regimen as noted above.  Glucometer data as reviewed above.  Denies complaints of numbness and tingling of the feet.  No vision problem.  She has been regularly following with ophthalmology.  She has been taking levothyroxine  50 mcg daily.  Denies hypo or hyperthyroid symptoms.  No other complaints today.  REVIEW OF SYSTEMS As per history of present illness.   PAST MEDICAL HISTORY: Past Medical History:  Diagnosis Date   Diabetes (HCC) 2005   High blood pressure 1999    PAST SURGICAL HISTORY: Past Surgical History:  Procedure Laterality Date   ABDOMINAL HYSTERECTOMY     GIVENS CAPSULE STUDY N/A 03/04/2018   Procedure: GIVENS CAPSULE STUDY;  Surgeon: Kristie Lamprey, MD;  Location: [facility] ENDOSCOPY;  Service: Endoscopy;  Laterality: N/A;    ALLERGIES: No Known Allergies  FAMILY HISTORY:  Family History  Problem Relation Age of Onset   Diabetes Mother    Asthma Grandson    Eczema Grandson    Asthma Nephew    Eczema Daughter     SOCIAL HISTORY:  Social History   Socioeconomic History   Marital status: Married    Spouse name: Not on file   Number of children: Not on file   Years of education: Not on file   Highest education level: Not on file  Occupational History   Not on file  Tobacco Use   Smoking status: Never   Smokeless tobacco: Never  Vaping Use   Vaping status: Never Used  Substance and Sexual Activity   Alcohol use: Not Currently   Drug use: Never   Sexual activity: Not Currently  Other Topics Concern   Not on file  Social History Narrative   Not on file   Social Drivers of Health   Financial Resource Strain: Not on file  Food Insecurity: Unknown (10/15/2022)   Received  from [facility]   Hunger Vital Sign    Worried About Running Out of Food in the Last Year: Patient declined to answer    Ran Out of Food in the Last Year: Patient declined to answer  Transportation Needs: Not on file (10/15/2022)  Physical Activity: Not on file  Stress: Not on file  Social Connections: Not on file    MEDICATIONS:  Current Outpatient Medications  Medication Sig Dispense Refill   BAYER MICROLET LANCETS lancets Use as instructed to check blood sugar 2 times per day dx code E11.65 100 each 3   Blood Glucose Monitoring Suppl (ONETOUCH VERIO) w/Device KIT Use to check blood sugar daily 1 kit 0   COVID-19 mRNA bivalent vaccine, Moderna, (MODERNA COVID-19 BIVAL BOOSTER) 50 MCG/0.5ML injection Inject into the muscle. 0.5 mL 0   COVID-19 mRNA vaccine 2023-2024 (COMIRNATY ) SUSP injection Inject into the muscle. 0.3 mL 0   COVID-19 mRNA vaccine, Moderna, 100 MCG/0.5ML injection Inject into the muscle. 0.25 mL 0   desonide  (DESOWEN ) 0.05 % ointment Apply sparingly to affected areas on the face or neck 15 g 0   influenza vaccine adjuvanted (FLUAD ) 0.5 ML injection Inject into the muscle. 0.5 mL 0   Lancets (ONETOUCH ULTRASOFT) lancets Use to check blood sugar twice daily. 100 each 12   loratadine (CLARITIN) 10 MG tablet Take 10 mg by mouth 2 (two) times daily.     mometasone  (ELOCON ) 0.1 % ointment Apply topically daily. Apply sparingly  to affected areas as needed below face and neck 45 g 3   ONETOUCH VERIO test strip USE TO CHECK BLOOD SUGAR TWICE DAILY 100 strip 3   tiZANidine  (ZANAFLEX ) 2 MG tablet Take 1 to 2 tablets every 8 hours as needed for muscle spasms 30 tablet 0   triamcinolone  cream (KENALOG ) 0.1 %      valsartan  (DIOVAN ) 80 MG tablet TAKE 1 TABLET BY MOUTH EVERY DAY 90 tablet 1   Empagliflozin-metFORMIN  HCl (SYNJARDY ) 08-998 MG TABS Take 1 tablet by mouth daily. 90 tablet 3   levothyroxine  (SYNTHROID ) 50 MCG tablet Take 1 tablet (50 mcg total) by mouth daily. 90 tablet  3   rosuvastatin  (CRESTOR ) 5 MG tablet Take 1 tablet (5 mg total) by mouth daily. 90 tablet 3   No current facility-administered medications for this visit.    PHYSICAL EXAM: Vitals:   04/12/23 1105  BP: 138/60  Pulse: 60  Resp: 20  SpO2: 99%  Weight: 150 lb 3.2 oz (68.1 kg)  Height: 5' 4 (1.626 m)   Body mass index is 25.78 kg/m.  Wt Readings from Last 3 Encounters:  04/12/23 150 lb 3.2 oz (68.1 kg)  09/29/22 163 lb (73.9 kg)  05/31/22 161 lb 3.2 oz (73.1 kg)    General: Well developed, well nourished female in no apparent distress.  HEENT: AT/[place], no external lesions.  Eyes: Conjunctiva clear and no icterus. Neck: Neck supple  Lungs: Respirations not labored Neurologic: Alert, oriented, normal speech Extremities / Skin: Dry. No sores or rashes noted.  Psychiatric: Does not appear depressed or anxious  Diabetic Foot Exam - Simple   No data filed     LABS Reviewed Lab Results  Component Value Date   HGBA1C 6.8 (H) 04/02/2023   HGBA1C 7.1 (H) 09/25/2022   HGBA1C 7.1 (H) 05/29/2022   Lab Results  Component Value Date   FRUCTOSAMINE 346 (H) 12/23/2019   FRUCTOSAMINE 444 (H) 11/07/2019   Lab Results  Component Value Date   CHOL 171 05/29/2022   HDL 82.90 05/29/2022   LDLCALC 79 05/29/2022   LDLDIRECT 62.0 09/25/2022   TRIG 46.0 05/29/2022   CHOLHDL 2 05/29/2022   Lab Results  Component Value Date   MICRALBCREAT 2.3 02/10/2022   MICRALBCREAT 1.2 08/20/2020   Lab Results  Component Value Date   CREATININE 0.98 04/02/2023   Lab Results  Component Value Date   GFR 50.77 (L) 09/25/2022    ASSESSMENT / PLAN  1. Controlled type 2 diabetes mellitus with complication, without long-term current use of insulin  (HCC)   2. Acquired autoimmune hypothyroidism   3. Mixed hyperlipidemia     Diabetes Mellitus type 2, complicated by diabetic retinopathy. - Diabetic status / severity: Controlled.  Lab Results  Component Value Date   HGBA1C 6.8 (H)  04/02/2023    - Hemoglobin A1c goal : <7%  - Medications: No change.  I) Synjardy  08/998 mg 1 tablet daily.  - Home glucose testing: Few times a week at different times of the day. - Discussed/ Gave Hypoglycemia treatment plan.  # Consult : not required at this time.   # Annual urine for microalbuminuria/ creatinine ratio, no microalbuminuria currently, continue ACE/ARB /valsartan .  Will check urine microalbumin creatinine ratio today. Last  Lab Results  Component Value Date   MICRALBCREAT 2.3 02/10/2022    # Foot check nightly / neuropathy.  # She has diabetic retinopathy, following with ophthalmology regularly.   - Diet: Make healthy diabetic food choices - Life style / activity / exercise: Discussed.  2. Blood pressure  -  BP Readings from Last 1 Encounters:  04/12/23 138/60    - Control is in target.  - No change in current plans.  3. Lipid status / Hyperlipidemia - Last  Lab Results  Component Value Date   LDLCALC 79 05/29/2022   - Continue rosuvastatin  5 mg daily.  # Primary hypothyroidism -Continue levothyroxine  50 mcg daily. -Check thyroid  function test prior to follow-up visit.  Diagnoses and all orders for this visit:  Controlled type 2 diabetes mellitus with complication, without long-term current use of insulin  (HCC) -     Microalbumin / creatinine urine ratio -     Lipid panel -     BASIC METABOLIC PANEL WITH GFR -     Hemoglobin A1c  Acquired autoimmune hypothyroidism -     T4, free -     TSH  Mixed hyperlipidemia  Other orders -     Empagliflozin-metFORMIN  HCl (SYNJARDY ) 08-998 MG TABS; Take 1 tablet by mouth daily. -     levothyroxine  (SYNTHROID ) 50 MCG tablet; Take 1 tablet (50 mcg total) by mouth daily. -     rosuvastatin  (CRESTOR ) 5 MG tablet; Take 1  tablet (5 mg total) by mouth daily.    DISPOSITION Follow up in clinic in 5 months suggested.  Labs prior to follow-up visit.   All questions answered and patient verbalized  understanding of the plan.  Monaca Wadas, MD [facility] Endocrinology [facility] [address] [place], [address] [place], [place] [address] Phone # [phone]  At least part of this note was generated using voice recognition software. Inadvertent word errors may have occurred, which were not recognized during the proofreading process.

## 2023-04-13 LAB — MICROALBUMIN / CREATININE URINE RATIO
Creatinine, Urine: 13 mg/dL — ABNORMAL LOW (ref 20–275)
Microalb Creat Ratio: 38 mg/g{creat} — ABNORMAL HIGH (ref <30)
Microalb, Ur: 0.5 mg/dL

## 2023-04-26 DIAGNOSIS — M25311 Other instability, right shoulder: Secondary | ICD-10-CM | POA: Diagnosis not present

## 2023-05-02 DIAGNOSIS — M5412 Radiculopathy, cervical region: Secondary | ICD-10-CM | POA: Diagnosis not present

## 2023-05-09 DIAGNOSIS — M5412 Radiculopathy, cervical region: Secondary | ICD-10-CM | POA: Diagnosis not present

## 2023-05-11 DIAGNOSIS — M5412 Radiculopathy, cervical region: Secondary | ICD-10-CM | POA: Diagnosis not present

## 2023-05-16 DIAGNOSIS — M5412 Radiculopathy, cervical region: Secondary | ICD-10-CM | POA: Diagnosis not present

## 2023-05-18 DIAGNOSIS — M5412 Radiculopathy, cervical region: Secondary | ICD-10-CM | POA: Diagnosis not present

## 2023-05-22 ENCOUNTER — Other Ambulatory Visit: Payer: Self-pay

## 2023-05-22 DIAGNOSIS — N186 End stage renal disease: Secondary | ICD-10-CM | POA: Diagnosis not present

## 2023-05-22 DIAGNOSIS — E1122 Type 2 diabetes mellitus with diabetic chronic kidney disease: Secondary | ICD-10-CM | POA: Diagnosis not present

## 2023-05-22 DIAGNOSIS — I10 Essential (primary) hypertension: Secondary | ICD-10-CM

## 2023-05-22 DIAGNOSIS — G4730 Sleep apnea, unspecified: Secondary | ICD-10-CM | POA: Diagnosis not present

## 2023-05-22 DIAGNOSIS — I129 Hypertensive chronic kidney disease with stage 1 through stage 4 chronic kidney disease, or unspecified chronic kidney disease: Secondary | ICD-10-CM | POA: Diagnosis not present

## 2023-05-22 DIAGNOSIS — E7800 Pure hypercholesterolemia, unspecified: Secondary | ICD-10-CM | POA: Diagnosis not present

## 2023-05-22 DIAGNOSIS — E785 Hyperlipidemia, unspecified: Secondary | ICD-10-CM | POA: Diagnosis not present

## 2023-05-22 DIAGNOSIS — Z0000 Encounter for general adult medical examination without abnormal findings: Secondary | ICD-10-CM | POA: Diagnosis not present

## 2023-05-22 DIAGNOSIS — E1169 Type 2 diabetes mellitus with other specified complication: Secondary | ICD-10-CM | POA: Diagnosis not present

## 2023-05-22 DIAGNOSIS — E039 Hypothyroidism, unspecified: Secondary | ICD-10-CM | POA: Diagnosis not present

## 2023-05-22 MED ORDER — VALSARTAN 80 MG PO TABS
80.0000 mg | ORAL_TABLET | Freq: Every day | ORAL | 1 refills | Status: AC
Start: 2023-05-22 — End: ?

## 2023-05-22 NOTE — Telephone Encounter (Signed)
Patient left VM requesting refill, however didn't say what was needed. Checked Synthroid and Synjardy most recent meds, but they were sent in for 90 day with refills. RN left VM to patient to specify.

## 2023-05-31 ENCOUNTER — Telehealth: Payer: Self-pay

## 2023-05-31 DIAGNOSIS — E118 Type 2 diabetes mellitus with unspecified complications: Secondary | ICD-10-CM

## 2023-05-31 MED ORDER — DAPAGLIFLOZIN PRO-METFORMIN ER 5-1000 MG PO TB24
1.0000 | ORAL_TABLET | Freq: Every day | ORAL | 3 refills | Status: DC
Start: 2023-05-31 — End: 2023-09-10

## 2023-05-31 NOTE — Telephone Encounter (Signed)
 Sent prescription alternative similar medicine called Xigduo 08-998 mg 1 tablet daily.  Asked to check the coverage and cost with the pharmacy.

## 2023-05-31 NOTE — Telephone Encounter (Signed)
 Patient called stating Allison Matthews too expensive with her insurance. Requesting an comparable alternative be called in.

## 2023-05-31 NOTE — Telephone Encounter (Signed)
 Concern sent to MD

## 2023-05-31 NOTE — Addendum Note (Signed)
 Addended by: Stevey Stapleton, [place] on: 05/31/2023 04:01 PM   Modules accepted: Orders

## 2023-06-04 ENCOUNTER — Other Ambulatory Visit: Payer: Self-pay | Admitting: Endocrinology

## 2023-06-04 DIAGNOSIS — E118 Type 2 diabetes mellitus with unspecified complications: Secondary | ICD-10-CM

## 2023-06-04 DIAGNOSIS — H34811 Central retinal vein occlusion, right eye, with macular edema: Secondary | ICD-10-CM | POA: Diagnosis not present

## 2023-06-04 DIAGNOSIS — H35351 Cystoid macular degeneration, right eye: Secondary | ICD-10-CM | POA: Diagnosis not present

## 2023-06-04 DIAGNOSIS — H401111 Primary open-angle glaucoma, right eye, mild stage: Secondary | ICD-10-CM | POA: Diagnosis not present

## 2023-06-04 DIAGNOSIS — H43821 Vitreomacular adhesion, right eye: Secondary | ICD-10-CM | POA: Diagnosis not present

## 2023-06-04 DIAGNOSIS — H2512 Age-related nuclear cataract, left eye: Secondary | ICD-10-CM | POA: Diagnosis not present

## 2023-06-04 DIAGNOSIS — H348111 Central retinal vein occlusion, right eye, with retinal neovascularization: Secondary | ICD-10-CM | POA: Diagnosis not present

## 2023-06-04 DIAGNOSIS — E119 Type 2 diabetes mellitus without complications: Secondary | ICD-10-CM | POA: Diagnosis not present

## 2023-06-04 DIAGNOSIS — Z961 Presence of intraocular lens: Secondary | ICD-10-CM | POA: Diagnosis not present

## 2023-06-04 MED ORDER — METFORMIN HCL ER 500 MG PO TB24
1000.0000 mg | ORAL_TABLET | Freq: Every day | ORAL | 3 refills | Status: AC
Start: 2023-06-04 — End: ?

## 2023-06-05 ENCOUNTER — Telehealth: Payer: Self-pay | Admitting: Endocrinology

## 2023-06-05 NOTE — Telephone Encounter (Signed)
 Patient came in to office today and picked up patient assistance application.  She will complete her part and bring it back to the office.

## 2023-06-06 ENCOUNTER — Other Ambulatory Visit (HOSPITAL_BASED_OUTPATIENT_CLINIC_OR_DEPARTMENT_OTHER): Payer: Self-pay

## 2023-06-06 MED ORDER — SHINGRIX 50 MCG/0.5ML IM SUSR
0.5000 mL | Freq: Once | INTRAMUSCULAR | 1 refills | Status: AC
  Filled 2023-06-06: qty 0.5, 1d supply, fill #0

## 2023-06-29 ENCOUNTER — Other Ambulatory Visit: Payer: Self-pay

## 2023-06-29 ENCOUNTER — Telehealth: Payer: Self-pay

## 2023-06-29 DIAGNOSIS — E1165 Type 2 diabetes mellitus with hyperglycemia: Secondary | ICD-10-CM

## 2023-06-29 MED ORDER — ONETOUCH VERIO VI STRP
ORAL_STRIP | 3 refills | Status: DC
Start: 2023-06-29 — End: 2023-11-22

## 2023-06-29 NOTE — Telephone Encounter (Signed)
 Refill request complete

## 2023-07-24 DIAGNOSIS — I10 Essential (primary) hypertension: Secondary | ICD-10-CM | POA: Diagnosis not present

## 2023-07-24 DIAGNOSIS — R7309 Other abnormal glucose: Secondary | ICD-10-CM | POA: Diagnosis not present

## 2023-08-13 ENCOUNTER — Other Ambulatory Visit (HOSPITAL_BASED_OUTPATIENT_CLINIC_OR_DEPARTMENT_OTHER): Payer: Self-pay

## 2023-08-13 MED ORDER — SHINGRIX 50 MCG/0.5ML IM SUSR
INTRAMUSCULAR | 0 refills | Status: DC
  Filled 2023-08-13: qty 0.5, 1d supply, fill #0

## 2023-08-15 ENCOUNTER — Other Ambulatory Visit: Payer: Self-pay

## 2023-09-05 ENCOUNTER — Other Ambulatory Visit: Payer: Medicare Other

## 2023-09-05 DIAGNOSIS — E1165 Type 2 diabetes mellitus with hyperglycemia: Secondary | ICD-10-CM | POA: Diagnosis not present

## 2023-09-06 ENCOUNTER — Ambulatory Visit: Payer: Self-pay | Admitting: Endocrinology

## 2023-09-06 LAB — HEMOGLOBIN A1C
Hgb A1c MFr Bld: 6.8 % — ABNORMAL HIGH (ref <5.7)
Mean Plasma Glucose: 148 mg/dL
eAG (mmol/L): 8.2 mmol/L

## 2023-09-06 LAB — BASIC METABOLIC PANEL WITH GFR
BUN: 24 mg/dL (ref 7–25)
CO2: 30 mmol/L (ref 20–32)
Calcium: 10.5 mg/dL — ABNORMAL HIGH (ref 8.6–10.4)
Chloride: 98 mmol/L (ref 98–110)
Creat: 1 mg/dL (ref 0.60–1.00)
Glucose, Bld: 118 mg/dL — ABNORMAL HIGH (ref 65–99)
Potassium: 5.2 mmol/L (ref 3.5–5.3)
Sodium: 138 mmol/L (ref 135–146)
eGFR: 59 mL/min/{1.73_m2} — ABNORMAL LOW (ref >=60)

## 2023-09-06 LAB — T4, FREE: Free T4: 1.3 ng/dL (ref 0.8–1.8)

## 2023-09-06 LAB — LIPID PANEL
Cholesterol: 184 mg/dL (ref <200)
HDL: 81 mg/dL (ref >=50)
LDL Cholesterol (Calc): 89 mg/dL
Non-HDL Cholesterol (Calc): 103 mg/dL (ref <130)
Total CHOL/HDL Ratio: 2.3 (calc) (ref <5.0)
Triglycerides: 56 mg/dL (ref <150)

## 2023-09-06 LAB — TSH: TSH: 1.79 m[IU]/L (ref 0.40–4.50)

## 2023-09-10 ENCOUNTER — Encounter: Payer: Self-pay | Admitting: Endocrinology

## 2023-09-10 ENCOUNTER — Ambulatory Visit: Payer: Medicare Other | Admitting: Endocrinology

## 2023-09-10 VITALS — BP 124/60 | HR 71 | Resp 20 | Ht 64.0 in | Wt 147.2 lb

## 2023-09-10 DIAGNOSIS — E118 Type 2 diabetes mellitus with unspecified complications: Secondary | ICD-10-CM

## 2023-09-10 DIAGNOSIS — Z7984 Long term (current) use of oral hypoglycemic drugs: Secondary | ICD-10-CM | POA: Diagnosis not present

## 2023-09-10 DIAGNOSIS — E063 Autoimmune thyroiditis: Secondary | ICD-10-CM

## 2023-09-10 NOTE — Progress Notes (Signed)
 Outpatient Endocrinology Note [place] Gentry Pilson, MD   Patient's Name: Allison Matthews    DOB: [birth year]    MRN: [id number]                                                    REASON OF VISIT: Follow up for type 2 diabetes mellitus  PCP: Jonathon Neighbors, MD  HISTORY OF PRESENT ILLNESS:   Allison Matthews is a 73 y.o. old female with past medical history listed below, is here for follow up for type 2 diabetes mellitus / hypothyroidism.   Pertinent Diabetes History: Patient was previously seen by Dr. Hubert Madden and was last time seen in June 2024.  Patient was diagnosed with type 2 diabetes mellitus in 2005.  Patient was initially treated with insulin  and subsequently transition to Kombiglyze  with excellent control.  Patient has a relatively controlled type 2 diabetes mellitus.  Chronic Diabetes Complications : Retinopathy: yes. Last ophthalmology exam was done on every 6 months, following with ophthalmology regularly.  Treated with laser therapy in the past.  Follows up with Dr. Seward Dao. Nephropathy: CKD IIIa, on valsartan . Peripheral neuropathy: no Coronary artery disease: no Stroke: no  Relevant comorbidities and cardiovascular risk factors: Obesity: no Body mass index is 25.27 kg/m.  Hypertension: Yes  Hyperlipidemia : Yes, on statin   Current / Home Diabetic regimen includes:   Metformin  XR 1000 mg daily.   Prior diabetic medications: Insulin  therapy in the past long time ago.  Actos  in the past.  GI upset with higher dose of Synjardy .  Xigduo  and Synjardy  stopped due to high cost.  Glycemic data:   One Touch Verio placed glucometer.  Glucometer download from May 19 to June 2 , 2025.  Blood sugar 89.  Some of the blood sugar 127, 81, 95, 73, 74, 109, 88, 71, 99, 69, 96.  She has been checking blood sugar at different times of the day.   Hypoglycemia: Patient has no hypoglycemic episodes. Patient has hypoglycemia awareness.  Factors modifying glucose control: 1.   Diabetic diet assessment: 3 meals a day.  2.  Staying active or exercising: Gym and aerobic classes.  Exercising about 5 times a week.  3.  Medication compliance: compliant all of the time.  # Primary hypothyroidism : -Taking levothyroxine  50 mcg daily.  Interval history  Recent laboratory results reviewed.  Stable renal function.  Acceptable cholesterol level.  Normal thyroid  function test.  Hemoglobin A1c 6.8%.  She has been taking metformin  alone, Xigduo /Synjardy  was stopped due to high cost.  Denies GI upset with current dose of metformin .  She complains of occasional itching after being on metformin .  Denies hives or rashes.  Discussed about keeping an eye on it.  Unlikely to be related to allergy with metformin  as she had taken metformin  without itching or other issues in the past.  Glucometer data as reviewed above.  She has been taking levothyroxine  50 mcg daily.  Denies hypo and hyperthyroid symptoms.  Recent thyroid  function test normal.  No other complaints today.  REVIEW OF SYSTEMS As per history of present illness.   PAST MEDICAL HISTORY: Past Medical History:  Diagnosis Date   Diabetes (HCC) 2005   High blood pressure 1999    PAST SURGICAL HISTORY: Past Surgical History:  Procedure Laterality Date   ABDOMINAL HYSTERECTOMY  GIVENS CAPSULE STUDY N/A 03/04/2018   Procedure: GIVENS CAPSULE STUDY;  Surgeon: Tami Falcon, MD;  Location: [facility] ENDOSCOPY;  Service: Endoscopy;  Laterality: N/A;    ALLERGIES: No Known Allergies  FAMILY HISTORY:  Family History  Problem Relation Age of Onset   Diabetes Mother    Asthma Grandson    Eczema Grandson    Asthma Nephew    Eczema Daughter     SOCIAL HISTORY: Social History   Socioeconomic History   Marital status: Married    Spouse name: Not on file   Number of children: Not on file   Years of education: Not on file   Highest education level: Not on file  Occupational History   Not on file  Tobacco Use   Smoking  status: Never   Smokeless tobacco: Never  Vaping Use   Vaping status: Never Used  Substance and Sexual Activity   Alcohol use: Not Currently   Drug use: Never   Sexual activity: Not Currently  Other Topics Concern   Not on file  Social History Narrative   Not on file   Social Drivers of Health   Financial Resource Strain: Not on file  Food Insecurity: Unknown (10/15/2022)   Received from [facility]   Hunger Vital Sign    Worried About Running Out of Food in the Last Year: Patient declined to answer    Ran Out of Food in the Last Year: Patient declined to answer  Transportation Needs: Not on file (10/15/2022)  Physical Activity: Not on file  Stress: Not on file  Social Connections: Not on file    MEDICATIONS:  Current Outpatient Medications  Medication Sig Dispense Refill   BAYER MICROLET LANCETS lancets Use as instructed to check blood sugar 2 times per day dx code E11.65 100 each 3   Blood Glucose Monitoring Suppl (ONETOUCH VERIO) w/Device KIT Use to check blood sugar daily 1 kit 0   desonide  (DESOWEN ) 0.05 % ointment Apply sparingly to affected areas on the face or neck 15 g 0   glucose blood (ONETOUCH VERIO) test strip Use to check blood sugar twice daily. 100 strip 3   Lancets (ONETOUCH ULTRASOFT) lancets Use to check blood sugar twice daily. 100 each 12   levothyroxine  (SYNTHROID ) 50 MCG tablet Take 1 tablet (50 mcg total) by mouth daily. 90 tablet 3   loratadine (CLARITIN) 10 MG tablet Take 10 mg by mouth 2 (two) times daily.     metFORMIN  (GLUCOPHAGE -XR) 500 MG 24 hr tablet Take 2 tablets (1,000 mg total) by mouth daily. 180 tablet 3   mometasone  (ELOCON ) 0.1 % ointment Apply topically daily. Apply sparingly  to affected areas as needed below face and neck 45 g 3   rosuvastatin  (CRESTOR ) 5 MG tablet Take 1 tablet (5 mg total) by mouth daily. 90 tablet 3   tiZANidine  (ZANAFLEX ) 2 MG tablet Take 1 to 2 tablets every 8 hours as needed for muscle spasms 30 tablet 0    triamcinolone  cream (KENALOG ) 0.1 %      valsartan  (DIOVAN ) 80 MG tablet Take 1 tablet (80 mg total) by mouth daily. 90 tablet 1   Zoster Vaccine Adjuvanted (SHINGRIX ) injection Inject into the muscle. 0.5 mL 0   COVID-19 mRNA bivalent vaccine, Moderna, (MODERNA COVID-19 BIVAL BOOSTER) 50 MCG/0.5ML injection Inject into the muscle. (Patient not taking: Reported on 09/10/2023) 0.5 mL 0   COVID-19 mRNA vaccine 2023-2024 (COMIRNATY ) SUSP injection Inject into the muscle. (Patient not taking: Reported on 09/10/2023) 0.3  mL 0   COVID-19 mRNA vaccine, Moderna, 100 MCG/0.5ML injection Inject into the muscle. (Patient not taking: Reported on 09/10/2023) 0.25 mL 0   influenza vaccine adjuvanted (FLUAD) 0.5 ML injection Inject into the muscle. (Patient not taking: Reported on 09/10/2023) 0.5 mL 0   No current facility-administered medications for this visit.    PHYSICAL EXAM: Vitals:   09/10/23 1044  BP: 124/60  Pulse: 71  Resp: 20  SpO2: 99%  Weight: 147 lb 3.2 oz (66.8 kg)  Height: 5\' 4"  (1.626 m)    Body mass index is 25.27 kg/m.  Wt Readings from Last 3 Encounters:  09/10/23 147 lb 3.2 oz (66.8 kg)  04/12/23 150 lb 3.2 oz (68.1 kg)  09/29/22 163 lb (73.9 kg)    General: Well developed, well nourished female in no apparent distress.  HEENT: AT/[place], no external lesions.  Eyes: Conjunctiva clear and no icterus. Neck: Neck supple  Lungs: Respirations not labored Neurologic: Alert, oriented, normal speech Extremities / Skin: Dry. Psychiatric: Does not appear depressed or anxious  Diabetic Foot Exam - Simple   Simple Foot Form Diabetic Foot exam was performed with the following findings: Yes 09/10/2023 10:56 AM  Visual Inspection No deformities, no ulcerations, no other skin breakdown bilaterally: Yes Sensation Testing Intact to touch and monofilament testing bilaterally: Yes Pulse Check Posterior Tibialis and Dorsalis pulse intact bilaterally: Yes Comments     LABS Reviewed Lab Results   Component Value Date   HGBA1C 6.8 (H) 09/05/2023   HGBA1C 6.8 (H) 04/02/2023   HGBA1C 7.1 (H) 09/25/2022   Lab Results  Component Value Date   FRUCTOSAMINE 346 (H) 12/23/2019   FRUCTOSAMINE 444 (H) 11/07/2019   Lab Results  Component Value Date   CHOL 184 09/05/2023   HDL 81 09/05/2023   LDLCALC 89 09/05/2023   LDLDIRECT 62.0 09/25/2022   TRIG 56 09/05/2023   CHOLHDL 2.3 09/05/2023   Lab Results  Component Value Date   MICRALBCREAT 38 (H) 04/12/2023   MICRALBCREAT 2.3 02/10/2022   Lab Results  Component Value Date   CREATININE 1.00 09/05/2023   Lab Results  Component Value Date   GFR 50.77 (L) 09/25/2022    ASSESSMENT / PLAN  1. Controlled type 2 diabetes mellitus with complication, without long-term current use of insulin  (HCC)     Diabetes Mellitus type 2, complicated by diabetic retinopathy. - Diabetic status / severity: Controlled.  Lab Results  Component Value Date   HGBA1C 6.8 (H) 09/05/2023    - Hemoglobin A1c goal : <6.5%  Overall acceptable control of diabetes.  Currently on metformin  monotherapy.  - Medications: No change.  I) continue metformin  extended release 1000 mg daily.  - Home glucose testing: Few times a week at different times of the day. - Discussed/ Gave Hypoglycemia treatment plan.  # Consult : not required at this time.   # Annual urine for microalbuminuria/ creatinine ratio, + microalbuminuria currently, continue ACE/ARB /valsartan .   Last  Lab Results  Component Value Date   MICRALBCREAT 38 (H) 04/12/2023    # Foot check nightly / neuropathy.  # She has diabetic retinopathy, following with ophthalmology regularly.   - Diet: Make healthy diabetic food choices - Life style / activity / exercise: Discussed.  2. Blood pressure  -  BP Readings from Last 1 Encounters:  09/10/23 124/60    - Control is in target.  - No change in current plans.  3. Lipid status / Hyperlipidemia - Last  Lab Results  Component  Value  Date   LDLCALC 89 09/05/2023   - Continue rosuvastatin  5 mg daily.  # Primary hypothyroidism -Continue levothyroxine  50 mcg daily.  Recent thyroid  function test normal.  Annual thyroid  lab.  Diagnoses and all orders for this visit:  Controlled type 2 diabetes mellitus with complication, without long-term current use of insulin  (HCC) -     Hemoglobin A1c -     Basic metabolic panel with GFR -     Microalbumin / creatinine urine ratio    DISPOSITION Follow up in clinic in 5 months suggested.  Labs prior to follow-up visit as ordered.   All questions answered and patient verbalized understanding of the plan.  [place] Koralyn Prestage, MD [facility] Endocrinology [facility] [address] [place], [address] [place], [place] [address] Phone # [phone]  At least part of this note was generated using voice recognition software. Inadvertent word errors may have occurred, which were not recognized during the proofreading process.

## 2023-11-21 DIAGNOSIS — H5213 Myopia, bilateral: Secondary | ICD-10-CM | POA: Diagnosis not present

## 2023-11-21 DIAGNOSIS — E119 Type 2 diabetes mellitus without complications: Secondary | ICD-10-CM | POA: Diagnosis not present

## 2023-11-21 DIAGNOSIS — H2512 Age-related nuclear cataract, left eye: Secondary | ICD-10-CM | POA: Diagnosis not present

## 2023-11-21 DIAGNOSIS — H401131 Primary open-angle glaucoma, bilateral, mild stage: Secondary | ICD-10-CM | POA: Diagnosis not present

## 2023-11-21 LAB — HM DIABETES EYE EXAM

## 2023-11-22 ENCOUNTER — Other Ambulatory Visit: Payer: Self-pay | Admitting: Endocrinology

## 2023-11-22 DIAGNOSIS — E1165 Type 2 diabetes mellitus with hyperglycemia: Secondary | ICD-10-CM

## 2023-11-23 ENCOUNTER — Ambulatory Visit: Payer: Self-pay | Admitting: Endocrinology

## 2023-12-04 DIAGNOSIS — E7800 Pure hypercholesterolemia, unspecified: Secondary | ICD-10-CM | POA: Diagnosis not present

## 2023-12-04 DIAGNOSIS — E1169 Type 2 diabetes mellitus with other specified complication: Secondary | ICD-10-CM | POA: Diagnosis not present

## 2023-12-04 DIAGNOSIS — I10 Essential (primary) hypertension: Secondary | ICD-10-CM | POA: Diagnosis not present

## 2023-12-05 ENCOUNTER — Other Ambulatory Visit: Payer: Self-pay | Admitting: Endocrinology

## 2023-12-05 DIAGNOSIS — E1165 Type 2 diabetes mellitus with hyperglycemia: Secondary | ICD-10-CM

## 2023-12-05 DIAGNOSIS — E118 Type 2 diabetes mellitus with unspecified complications: Secondary | ICD-10-CM

## 2023-12-05 MED ORDER — LANCET DEVICE MISC
1.0000 | Freq: Every day | 0 refills | Status: AC
Start: 2023-12-05 — End: 2024-12-04

## 2023-12-05 MED ORDER — LANCETS MISC. MISC
1.0000 | Freq: Every day | 3 refills | Status: AC
Start: 2023-12-05 — End: 2024-12-04

## 2023-12-05 MED ORDER — BLOOD GLUCOSE TEST VI STRP
1.0000 | ORAL_STRIP | Freq: Every day | 3 refills | Status: AC
Start: 2023-12-05 — End: 2024-12-04

## 2023-12-05 MED ORDER — BLOOD GLUCOSE MONITORING SUPPL DEVI
1.0000 | Freq: Every day | 0 refills | Status: AC
Start: 2023-12-05 — End: ?

## 2023-12-19 ENCOUNTER — Other Ambulatory Visit (HOSPITAL_BASED_OUTPATIENT_CLINIC_OR_DEPARTMENT_OTHER): Payer: Self-pay

## 2023-12-19 MED ORDER — FLUZONE HIGH-DOSE 0.5 ML IM SUSY
0.5000 mL | PREFILLED_SYRINGE | Freq: Once | INTRAMUSCULAR | 0 refills | Status: AC
  Filled 2023-12-19: qty 0.5, 1d supply, fill #0

## 2023-12-22 ENCOUNTER — Other Ambulatory Visit (HOSPITAL_BASED_OUTPATIENT_CLINIC_OR_DEPARTMENT_OTHER): Payer: Self-pay

## 2023-12-22 MED ORDER — COMIRNATY 30 MCG/0.3ML IM SUSY: 0.3000 mL | PREFILLED_SYRINGE | INTRAMUSCULAR | 0 refills | Status: DC

## 2023-12-24 ENCOUNTER — Other Ambulatory Visit (HOSPITAL_BASED_OUTPATIENT_CLINIC_OR_DEPARTMENT_OTHER): Payer: Self-pay

## 2023-12-24 MED ORDER — COMIRNATY 30 MCG/0.3ML IM SUSY
0.3000 mL | PREFILLED_SYRINGE | Freq: Once | INTRAMUSCULAR | 0 refills | Status: AC
  Filled 2023-12-24: qty 0.3, 1d supply, fill #0

## 2024-01-10 DIAGNOSIS — H35351 Cystoid macular degeneration, right eye: Secondary | ICD-10-CM | POA: Diagnosis not present

## 2024-01-10 DIAGNOSIS — E119 Type 2 diabetes mellitus without complications: Secondary | ICD-10-CM | POA: Diagnosis not present

## 2024-01-10 DIAGNOSIS — H2512 Age-related nuclear cataract, left eye: Secondary | ICD-10-CM | POA: Diagnosis not present

## 2024-01-10 DIAGNOSIS — H401111 Primary open-angle glaucoma, right eye, mild stage: Secondary | ICD-10-CM | POA: Diagnosis not present

## 2024-01-10 DIAGNOSIS — H34811 Central retinal vein occlusion, right eye, with macular edema: Secondary | ICD-10-CM | POA: Diagnosis not present

## 2024-01-10 DIAGNOSIS — H43821 Vitreomacular adhesion, right eye: Secondary | ICD-10-CM | POA: Diagnosis not present

## 2024-01-18 DIAGNOSIS — Z1231 Encounter for screening mammogram for malignant neoplasm of breast: Secondary | ICD-10-CM | POA: Diagnosis not present

## 2024-02-07 ENCOUNTER — Other Ambulatory Visit

## 2024-02-08 ENCOUNTER — Ambulatory Visit: Payer: Self-pay | Admitting: Endocrinology

## 2024-02-08 LAB — BASIC METABOLIC PANEL WITH GFR
BUN/Creatinine Ratio: 24 (calc) — ABNORMAL HIGH (ref 6–22)
BUN: 32 mg/dL — ABNORMAL HIGH (ref 7–25)
CO2: 30 mmol/L (ref 20–32)
Calcium: 10.3 mg/dL (ref 8.6–10.4)
Chloride: 100 mmol/L (ref 98–110)
Creat: 1.32 mg/dL — ABNORMAL HIGH (ref 0.60–1.00)
Glucose, Bld: 121 mg/dL — ABNORMAL HIGH (ref 65–99)
Potassium: 4.3 mmol/L (ref 3.5–5.3)
Sodium: 138 mmol/L (ref 135–146)
eGFR: 43 mL/min/1.73m2 — ABNORMAL LOW (ref >=60)

## 2024-02-08 LAB — HEMOGLOBIN A1C
Hgb A1c MFr Bld: 7.1 % — ABNORMAL HIGH (ref <5.7)
Mean Plasma Glucose: 157 mg/dL
eAG (mmol/L): 8.7 mmol/L

## 2024-02-08 LAB — MICROALBUMIN / CREATININE URINE RATIO
Creatinine, Urine: 132 mg/dL (ref 20–275)
Microalb Creat Ratio: 4 mg/g{creat} (ref <30)
Microalb, Ur: 0.5 mg/dL

## 2024-02-11 ENCOUNTER — Ambulatory Visit: Admitting: Endocrinology

## 2024-02-11 ENCOUNTER — Encounter: Payer: Self-pay | Admitting: Endocrinology

## 2024-02-11 VITALS — BP 136/80 | Ht 64.0 in | Wt 151.0 lb

## 2024-02-11 DIAGNOSIS — K5904 Chronic idiopathic constipation: Secondary | ICD-10-CM | POA: Insufficient documentation

## 2024-02-11 DIAGNOSIS — N1831 Chronic kidney disease, stage 3a: Secondary | ICD-10-CM

## 2024-02-11 DIAGNOSIS — E782 Mixed hyperlipidemia: Secondary | ICD-10-CM

## 2024-02-11 DIAGNOSIS — I10 Essential (primary) hypertension: Secondary | ICD-10-CM | POA: Diagnosis not present

## 2024-02-11 DIAGNOSIS — E063 Autoimmune thyroiditis: Secondary | ICD-10-CM

## 2024-02-11 DIAGNOSIS — E118 Type 2 diabetes mellitus with unspecified complications: Secondary | ICD-10-CM

## 2024-02-11 DIAGNOSIS — Z7984 Long term (current) use of oral hypoglycemic drugs: Secondary | ICD-10-CM

## 2024-02-11 DIAGNOSIS — R634 Abnormal weight loss: Secondary | ICD-10-CM | POA: Insufficient documentation

## 2024-02-11 DIAGNOSIS — K5900 Constipation, unspecified: Secondary | ICD-10-CM | POA: Insufficient documentation

## 2024-02-11 NOTE — Progress Notes (Signed)
 Outpatient Endocrinology Note Ayoub Arey, MD   Patient's Name: Allison Matthews    DOB: 06-01-1950    MRN: [id number]                                                    REASON OF VISIT: Follow up for type 2 diabetes mellitus  PCP: Benjamine Aland, MD  HISTORY OF PRESENT ILLNESS:   Allison Matthews is a 73 y.o. old female with past medical history listed below, is here for follow up for type 2 diabetes mellitus / hypothyroidism.   Pertinent Diabetes History: Patient was previously seen by Dr. Von and was last time seen in June 2024.  Patient was diagnosed with type 2 diabetes mellitus in 2005.  Patient was initially treated with insulin  and subsequently transition to Kombiglyze  with excellent control.  Patient has a relatively controlled type 2 diabetes mellitus.  Chronic Diabetes Complications : Retinopathy: yes. Last ophthalmology exam was done on every 6 months, following with ophthalmology regularly.  Treated with laser therapy in the past.  Follows up with Dr. Elner. Nephropathy: CKD IIIa, on valsartan . Peripheral neuropathy: no Coronary artery disease: no Stroke: no  Relevant comorbidities and cardiovascular risk factors: Obesity: no Body mass index is 25.92 kg/m.  Hypertension: Yes  Hyperlipidemia : Yes, on statin   Current / Home Diabetic regimen includes:   Metformin  XR 1000 mg daily.   Prior diabetic medications: Insulin  therapy in the past long time ago.  Actos  in the past.  GI upset with higher dose of Synjardy .  Xigduo  and Synjardy  stopped due to high cost.  Glycemic data:   One Touch Verio placed glucometer.  Glucometer download from October 20 to February 11, 2024.  Average blood sugar 102.  Some of the fasting blood sugar 90, 101, 106, 109, 81.  Blood sugar in the afternoon 88, 135.  Hypoglycemia: Patient has no hypoglycemic episodes. Patient has hypoglycemia awareness.  Factors modifying glucose control: 1.  Diabetic diet assessment: 3 meals  a day.  2.  Staying active or exercising: Gym and aerobic classes.  Exercising about 5 times a week.  3.  Medication compliance: compliant all of the time.  # Primary hypothyroidism : -Taking levothyroxine  50 mcg daily.  Interval history  Recent laboratory results reviewed, hemoglobin A1c 7.1%.  She has worsening creatinine and eGFR with elevated BUN likely dehydration related.  She has been taking metformin .  Denies numbness and tingling of the feet.  No vision problem.  No other complaints today.  Glucometer data as reviewed above.  She has been taking levothyroxine  50 mcg daily, denies hypo and hyperthyroid symptoms.  REVIEW OF SYSTEMS As per history of present illness.   PAST MEDICAL HISTORY: Past Medical History:  Diagnosis Date   Diabetes (HCC) 2005   High blood pressure 1999    PAST SURGICAL HISTORY: Past Surgical History:  Procedure Laterality Date   ABDOMINAL HYSTERECTOMY     GIVENS CAPSULE STUDY N/A 03/04/2018   Procedure: GIVENS CAPSULE STUDY;  Surgeon: Kristie Lamprey, MD;  Location: [facility] ENDOSCOPY;  Service: Endoscopy;  Laterality: N/A;    ALLERGIES: No Known Allergies  FAMILY HISTORY:  Family History  Problem Relation Age of Onset   Diabetes Mother    Asthma Grandson    Eczema Grandson    Asthma Nephew    Eczema Daughter  SOCIAL HISTORY: Social History   Socioeconomic History   Marital status: Married    Spouse name: Not on file   Number of children: Not on file   Years of education: Not on file   Highest education level: Not on file  Occupational History   Not on file  Tobacco Use   Smoking status: Never   Smokeless tobacco: Never  Vaping Use   Vaping status: Never Used  Substance and Sexual Activity   Alcohol use: Not Currently   Drug use: Never   Sexual activity: Not Currently  Other Topics Concern   Not on file  Social History Narrative   Not on file   Social Drivers of Health   Financial Resource Strain: Not on file  Food  Insecurity: Unknown (10/15/2022)   Received from [facility]   Hunger Vital Sign    Within the past 12 months, you worried that your food would run out before you got money to buy more: Patient declined to answer    Within the past 12 months, the food you bought just didn't last and you didn't have money to get more. : Patient declined to answer  Transportation Needs: Not on file (10/15/2022)  Physical Activity: Not on file  Stress: Not on file  Social Connections: Not on file    MEDICATIONS:  Current Outpatient Medications  Medication Sig Dispense Refill   BAYER MICROLET LANCETS lancets Use as instructed to check blood sugar 2 times per day dx code E11.65 100 each 3   Blood Glucose Monitoring Suppl (ONETOUCH VERIO) w/Device KIT Use to check blood sugar daily 1 kit 0   Blood Glucose Monitoring Suppl DEVI 1 each by Does not apply route daily. May substitute to any manufacturer covered by patient's insurance. 1 each 0   desonide  (DESOWEN ) 0.05 % ointment Apply sparingly to affected areas on the face or neck 15 g 0   Glucose Blood (BLOOD GLUCOSE TEST STRIPS) STRP 1 each by In Vitro route daily. May substitute to any manufacturer covered by patient's insurance. 100 each 3   Lancet Device MISC 1 each by Does not apply route daily. May substitute to any manufacturer covered by patient's insurance. 1 each 0   Lancets (ONETOUCH ULTRASOFT) lancets Use to check blood sugar twice daily. 100 each 12   Lancets Misc. MISC 1 each by Does not apply route daily. May substitute to any manufacturer covered by patient's insurance. 100 each 3   levothyroxine  (SYNTHROID ) 50 MCG tablet Take 1 tablet (50 mcg total) by mouth daily. 90 tablet 3   loratadine (CLARITIN) 10 MG tablet Take 10 mg by mouth 2 (two) times daily.     metFORMIN  (GLUCOPHAGE -XR) 500 MG 24 hr tablet Take 2 tablets (1,000 mg total) by mouth daily. 180 tablet 3   mometasone  (ELOCON ) 0.1 % ointment Apply topically daily. Apply sparingly  to affected  areas as needed below face and neck 45 g 3   rosuvastatin  (CRESTOR ) 5 MG tablet Take 1 tablet (5 mg total) by mouth daily. 90 tablet 3   tiZANidine  (ZANAFLEX ) 2 MG tablet Take 1 to 2 tablets every 8 hours as needed for muscle spasms 30 tablet 0   triamcinolone  cream (KENALOG ) 0.1 %      valsartan  (DIOVAN ) 80 MG tablet Take 1 tablet (80 mg total) by mouth daily. 90 tablet 1   No current facility-administered medications for this visit.    PHYSICAL EXAM: Vitals:   02/11/24 1045  BP: 136/80  Weight:  151 lb (68.5 kg)  Height: 5' 4 (1.626 m)     Body mass index is 25.92 kg/m.  Wt Readings from Last 3 Encounters:  02/11/24 151 lb (68.5 kg)  09/10/23 147 lb 3.2 oz (66.8 kg)  04/12/23 150 lb 3.2 oz (68.1 kg)    General: Well developed, well nourished female in no apparent distress.  HEENT: AT/[place], no external lesions.  Eyes: Conjunctiva clear and no icterus. Neck: Neck supple  Lungs: Respirations not labored Neurologic: Alert, oriented, normal speech Extremities / Skin: Dry. Psychiatric: Does not appear depressed or anxious  Diabetic Foot Exam - Simple   No data filed     LABS Reviewed Lab Results  Component Value Date   HGBA1C 7.1 (H) 02/07/2024   HGBA1C 6.8 (H) 09/05/2023   HGBA1C 6.8 (H) 04/02/2023   Lab Results  Component Value Date   FRUCTOSAMINE 346 (H) 12/23/2019   FRUCTOSAMINE 444 (H) 11/07/2019   Lab Results  Component Value Date   CHOL 184 09/05/2023   HDL 81 09/05/2023   LDLCALC 89 09/05/2023   LDLDIRECT 62.0 09/25/2022   TRIG 56 09/05/2023   CHOLHDL 2.3 09/05/2023   Lab Results  Component Value Date   MICRALBCREAT 4 02/07/2024   MICRALBCREAT 38 (H) 04/12/2023   Lab Results  Component Value Date   CREATININE 1.32 (H) 02/07/2024   Lab Results  Component Value Date   GFR 50.77 (L) 09/25/2022    ASSESSMENT / PLAN  1. Controlled type 2 diabetes mellitus with complication, without long-term current use of insulin  (HCC)   2. Stage 3a chronic  kidney disease (HCC)   3. Acquired autoimmune hypothyroidism   4. Essential hypertension   5. Mixed hyperlipidemia      Diabetes Mellitus type 2, complicated by diabetic retinopathy. - Diabetic status / severity: fair Controlled.  Lab Results  Component Value Date   HGBA1C 7.1 (H) 02/07/2024    - Hemoglobin A1c goal : <6.5%  Overall acceptable control of diabetes.  Currently on metformin  monotherapy.  - Medications: No change.  I) continue metformin  extended release 1000 mg daily.  - Home glucose testing: Few times a week at different times of the day. - Discussed/ Gave Hypoglycemia treatment plan.  # Consult : not required at this time.   # Annual urine for microalbuminuria/ creatinine ratio, no microalbuminuria currently, continue ACE/ARB /valsartan .   Last  Lab Results  Component Value Date   MICRALBCREAT 4 02/07/2024   She has CKD, recent lab with worsening creatinine and eGFR and BUN, likely dehydration contributing, I would like to recheck BMP with eGFR in 1 month.  Advised patient for well hydration.  If eGFR remains low we will adjust/decrease the dose of metformin .  # Foot check nightly / neuropathy.  # She has diabetic retinopathy, following with ophthalmology regularly.   - Diet: Make healthy diabetic food choices - Life style / activity / exercise: Discussed.  2. Blood pressure  -  BP Readings from Last 1 Encounters:  02/11/24 136/80    - Control is in target.  - No change in current plans.  3. Lipid status / Hyperlipidemia - Last  Lab Results  Component Value Date   LDLCALC 89 09/05/2023   - Continue rosuvastatin  5 mg daily.  # Primary hypothyroidism -Continue levothyroxine  50 mcg daily.   Annual thyroid  lab. - Recheck at follow-up visit.  Allison Matthews was seen today for diabetes.  Diagnoses and all orders for this visit:  Controlled type 2 diabetes mellitus with  complication, without long-term current use of insulin  (HCC) -     Basic  metabolic panel with GFR  Stage 3a chronic kidney disease (HCC)  Acquired autoimmune hypothyroidism  Essential hypertension  Mixed hyperlipidemia     DISPOSITION Follow up in clinic in 5 months suggested.  Labs prior to follow-up visit as ordered.   All questions answered and patient verbalized understanding of the plan.  Allison Rivenburg, MD [facility] Endocrinology [facility] [address] [place], [address] [place], [place] [address] Phone # [phone]  At least part of this note was generated using voice recognition software. Inadvertent word errors may have occurred, which were not recognized during the proofreading process.

## 2024-03-12 ENCOUNTER — Other Ambulatory Visit

## 2024-03-12 LAB — BASIC METABOLIC PANEL WITH GFR
BUN/Creatinine Ratio: 22 (calc) (ref 6–22)
BUN: 26 mg/dL — ABNORMAL HIGH (ref 7–25)
CO2: 27 mmol/L (ref 20–32)
Calcium: 10.3 mg/dL (ref 8.6–10.4)
Chloride: 99 mmol/L (ref 98–110)
Creat: 1.2 mg/dL — ABNORMAL HIGH (ref 0.60–1.00)
Glucose, Bld: 134 mg/dL — ABNORMAL HIGH (ref 65–99)
Potassium: 4.4 mmol/L (ref 3.5–5.3)
Sodium: 137 mmol/L (ref 135–146)
eGFR: 48 mL/min/1.73m2 — ABNORMAL LOW (ref >=60)

## 2024-03-13 ENCOUNTER — Ambulatory Visit: Payer: Self-pay | Admitting: Endocrinology

## 2024-03-13 DIAGNOSIS — E118 Type 2 diabetes mellitus with unspecified complications: Secondary | ICD-10-CM

## 2024-03-13 DIAGNOSIS — E782 Mixed hyperlipidemia: Secondary | ICD-10-CM

## 2024-05-02 ENCOUNTER — Other Ambulatory Visit: Payer: Self-pay | Admitting: Endocrinology

## 2024-05-08 ENCOUNTER — Other Ambulatory Visit: Payer: Self-pay | Admitting: Endocrinology

## 2024-05-16 ENCOUNTER — Encounter: Payer: Self-pay | Admitting: *Deleted

## 2024-05-16 LAB — AMB RESULTS CONSOLE CBG: Glucose: 133

## 2024-05-16 NOTE — Progress Notes (Signed)
 Pt attended 05/16/24 screening event where her BP was 141/79 and her blood glucose was 133. Pt documented that she does have a PCP and insurance through Medicare. Pt did not note any SDOH needs at the time of the event.

## 2024-07-09 ENCOUNTER — Other Ambulatory Visit

## 2024-07-14 ENCOUNTER — Ambulatory Visit: Admitting: Endocrinology

## 2024-07-15 ENCOUNTER — Ambulatory Visit: Admitting: Endocrinology
